# Patient Record
Sex: Female | Born: 1990 | Race: White | Hispanic: No | Marital: Married | Smoking: Never smoker
Health system: Southern US, Community
[De-identification: ages and names within clinical notes are randomized; demographics above are authoritative.]

## PROBLEM LIST (undated history)

## (undated) ENCOUNTER — Inpatient Hospital Stay (HOSPITAL_COMMUNITY): Payer: Self-pay

## (undated) DIAGNOSIS — F32A Depression, unspecified: Secondary | ICD-10-CM

## (undated) DIAGNOSIS — M1990 Unspecified osteoarthritis, unspecified site: Secondary | ICD-10-CM

## (undated) DIAGNOSIS — G43909 Migraine, unspecified, not intractable, without status migrainosus: Secondary | ICD-10-CM

## (undated) DIAGNOSIS — G40909 Epilepsy, unspecified, not intractable, without status epilepticus: Secondary | ICD-10-CM

## (undated) DIAGNOSIS — N83209 Unspecified ovarian cyst, unspecified side: Secondary | ICD-10-CM

## (undated) DIAGNOSIS — K900 Celiac disease: Secondary | ICD-10-CM

## (undated) DIAGNOSIS — R87629 Unspecified abnormal cytological findings in specimens from vagina: Secondary | ICD-10-CM

## (undated) DIAGNOSIS — G90A Postural orthostatic tachycardia syndrome (POTS): Secondary | ICD-10-CM

## (undated) DIAGNOSIS — Z87442 Personal history of urinary calculi: Secondary | ICD-10-CM

## (undated) DIAGNOSIS — Q7962 Hypermobile Ehlers-Danlos syndrome: Secondary | ICD-10-CM

## (undated) DIAGNOSIS — F429 Obsessive-compulsive disorder, unspecified: Secondary | ICD-10-CM

## (undated) DIAGNOSIS — L299 Pruritus, unspecified: Secondary | ICD-10-CM

## (undated) DIAGNOSIS — D8940 Mast cell activation, unspecified: Secondary | ICD-10-CM

## (undated) DIAGNOSIS — L509 Urticaria, unspecified: Secondary | ICD-10-CM

## (undated) DIAGNOSIS — M797 Fibromyalgia: Secondary | ICD-10-CM

## (undated) DIAGNOSIS — N289 Disorder of kidney and ureter, unspecified: Secondary | ICD-10-CM

## (undated) DIAGNOSIS — D649 Anemia, unspecified: Secondary | ICD-10-CM

## (undated) DIAGNOSIS — S42402A Unspecified fracture of lower end of left humerus, initial encounter for closed fracture: Secondary | ICD-10-CM

## (undated) DIAGNOSIS — J45909 Unspecified asthma, uncomplicated: Secondary | ICD-10-CM

## (undated) HISTORY — PX: TYMPANOSTOMY: SHX2586

## (undated) HISTORY — DX: Migraine, unspecified, not intractable, without status migrainosus: G43.909

## (undated) HISTORY — PX: ADENOIDECTOMY: SUR15

## (undated) HISTORY — DX: Urticaria, unspecified: L50.9

## (undated) HISTORY — PX: TYMPANOSTOMY TUBE PLACEMENT: SHX32

## (undated) HISTORY — DX: Unspecified asthma, uncomplicated: J45.909

## (undated) HISTORY — DX: Obsessive-compulsive disorder, unspecified: F42.9

---
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
## 2015-10-26 ENCOUNTER — Ambulatory Visit (HOSPITAL_COMMUNITY)
Admission: EM | Admit: 2015-10-26 | Discharge: 2015-10-26 | Disposition: A | Discharge status: Home / Self Care | Payer: BLUE CROSS/BLUE SHIELD | Attending: Emergency Medicine | Admitting: Emergency Medicine

## 2015-10-26 ENCOUNTER — Encounter (HOSPITAL_COMMUNITY): Payer: Self-pay

## 2015-10-26 DIAGNOSIS — R319 Hematuria, unspecified: Secondary | ICD-10-CM

## 2015-10-26 DIAGNOSIS — R110 Nausea: Secondary | ICD-10-CM

## 2015-10-26 DIAGNOSIS — J069 Acute upper respiratory infection, unspecified: Secondary | ICD-10-CM | POA: Diagnosis not present

## 2015-10-26 DIAGNOSIS — M549 Dorsalgia, unspecified: Secondary | ICD-10-CM

## 2015-10-26 HISTORY — DX: Unspecified osteoarthritis, unspecified site: M19.90

## 2015-10-26 HISTORY — DX: Disorder of kidney and ureter, unspecified: N28.9

## 2015-10-26 LAB — POCT I-STAT, CHEM 8
BUN: 3 mg/dL — ABNORMAL LOW (ref 6–20)
CHLORIDE: 103 mmol/L (ref 101–111)
CREATININE: 0.7 mg/dL (ref 0.44–1.00)
Calcium, Ion: 1.2 mmol/L (ref 1.12–1.23)
Glucose, Bld: 91 mg/dL (ref 65–99)
HEMATOCRIT: 43 % (ref 36.0–46.0)
HEMOGLOBIN: 14.6 g/dL (ref 12.0–15.0)
POTASSIUM: 3.5 mmol/L (ref 3.5–5.1)
Sodium: 140 mmol/L (ref 135–145)
TCO2: 25 mmol/L (ref 0–100)

## 2015-10-26 LAB — POCT URINALYSIS DIP (DEVICE)
BILIRUBIN URINE: NEGATIVE
Glucose, UA: NEGATIVE mg/dL
KETONES UR: NEGATIVE mg/dL
LEUKOCYTES UA: NEGATIVE
NITRITE: NEGATIVE
Protein, ur: NEGATIVE mg/dL
SPECIFIC GRAVITY, URINE: 1.01 (ref 1.005–1.030)
Urobilinogen, UA: 0.2 mg/dL (ref 0.0–1.0)
pH: 6.5 (ref 5.0–8.0)

## 2015-10-26 LAB — POCT PREGNANCY, URINE: Preg Test, Ur: NEGATIVE

## 2015-10-26 MED ORDER — TRAMADOL HCL 50 MG PO TABS: 50.0000 mg | ORAL_TABLET | Freq: Four times a day (QID) | ORAL | Status: DC | PRN

## 2015-10-26 MED ORDER — ONDANSETRON HCL 4 MG PO TABS: 4.0000 mg | ORAL_TABLET | Freq: Three times a day (TID) | ORAL | Status: DC | PRN

## 2015-10-26 MED ORDER — PREDNISONE 50 MG PO TABS: ORAL_TABLET | ORAL | Status: DC

## 2015-10-26 NOTE — ED Provider Notes (Signed)
CSN: [id number]     Arrival date & time 10/26/15  1648 History   First MD Initiated Contact with Patient 10/26/15 1715     Chief Complaint  Patient presents with  . Back Pain  . Nephropathy   (Consider location/radiation/quality/duration/timing/severity/associated sxs/prior Treatment) HPI  She is a 25 year old woman here for evaluation ofback pain. She states for the last 3 weeks she has had some discomfort in the bilateral mid back, worse on the left side. Over the last few days this has been getting worse with pain radiating into herlower abdomen and occasionally into her legs. She denies any dysuria, but states she has had some urgency and hesitancy. In the last few days she has developed nausea, this worsened today. She has not vomited. She reports a fever yesterday of 101. She also reports feeling somewhat short of breath and some nasal congestion, which she attributes to an upper respiratory infection she caught from her child.  She had trouble remembering her age this morning which prompted her to come to the urgent care.  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 She states she's not had any problems with this for the last 5 years.  Past Medical History  Diagnosis Date  . Nephropathy   . Arthritis    History reviewed. No pertinent past surgical history. No family history on file. Social History  Substance Use Topics  . Smoking status: Never Smoker   . Smokeless tobacco: Never Used  . Alcohol Use: Yes     Comment: occasional   OB History    No data available     Review of Systems As in history of present illness Allergies  Sulfa antibiotics  Home Medications   Prior to Admission medications   Medication Sig Start Date End Date Taking? Authorizing Provider  ondansetron (ZOFRAN) 4 MG tablet Take 1 tablet (4 mg total) by mouth every 8 (eight) hours as needed for nausea or vomiting. 10/26/15   Charm Rings, MD  predniSONE (DELTASONE) 50 MG tablet Take 1 pill daily for 5  days. 10/26/15   Charm Rings, MD  traMADol (ULTRAM) 50 MG tablet Take 1 tablet (50 mg total) by mouth every 6 (six) hours as needed. 10/26/15   Charm Rings, MD   Meds Ordered and Administered this Visit  Medications - No data to display  BP 126/72 mmHg  Pulse 78  Temp(Src) 99.8 F (37.7 C) (Oral)  Resp 16  SpO2 100%  LMP 10/06/2015 (Exact Date) No data found.   Physical Exam  Constitutional: She is oriented to person, place, and time. She appears well-developed and well-nourished. No distress.  Neck: Neck supple.  Cardiovascular: Normal rate, regular rhythm and normal heart sounds.   No murmur heard. Pulmonary/Chest: Effort normal and breath sounds normal. No respiratory distress. She has no wheezes. She has no rales.  Abdominal: Soft. Bowel sounds are normal. She exhibits no distension and no mass. There is tenderness (in lower quadrants and left side). There is no rebound and no guarding.  No CVA tenderness  Musculoskeletal:  Back: No erythema or edema. No vertebral tenderness or step-offs. She is tender to palpation in the left flank area. Straight leg raise negative bilaterally.  Neurological: She is alert and oriented to person, place, and time.    ED Course  Procedures (including critical care time)  Labs Review Labs Reviewed  POCT URINALYSIS DIP (DEVICE) - Abnormal; Notable for the following:    Hgb urine dipstick MODERATE (*)  All other components within normal limits  POCT I-STAT, CHEM 8 - Abnormal; Notable for the following:    BUN 3 (*)    All other components within normal limits  POCT PREGNANCY, URINE    Imaging Review No results found.    MDM   1. Mid back pain on left side   2. Hematuria   3. Nausea   4. URI (upper respiratory infection)    Urine with moderate blood, otherwise negative.  I-STAT is normal. She does have a history of kidney stones and states this feels very different I had an extensive discussion with the patient regarding her  symptoms and possible treatment options. We discussed transfer to the emergency room for additional evaluation as I do not have a good explanation for the hematuria, nausea, and pain.  We also discussed outpatient treatment for musculoskeletal pain with prednisone and tramadol and strict return precautions if symptoms worsen.  Given that she does have a sick child at home, she would like to try outpatient therapy.  I sent prescriptions for prednisone and Zofran to her pharmacy as well as a paper prescription for tramadol.  Strict return precautions reviewed.    Charm RingsErin J Honig, MD 10/26/15 [phone]

## 2015-10-26 NOTE — ED Notes (Signed)
Patient presents with lower back pain x 3 weeks, she states she has IGA Nephropathy. Patient has taken Tylenol yesterday 10/25/2015 for pain. No acute distress

## 2015-10-26 NOTE — Discharge Instructions (Signed)
I do not have a good way to put all of your symptoms together. I suspect you do have an upper respiratory infection from your son.  This is likely the cause of your fever nasal symptoms, and shortness of breath. You do have some blood in your urine, but no signs of infection and your blood work is normal. We are going to treat your pain as musculoskeletal pain. Take prednisone daily for 5 days for inflammation. Use the tramadol every 6-8 hours as needed for severe pain. Use the Zofran every 8 hours as needed for nausea. If things are not improving or getting worse, please go to the emergency room.

## 2015-12-04 ENCOUNTER — Encounter: Payer: Self-pay | Admitting: Neurology

## 2015-12-04 ENCOUNTER — Ambulatory Visit (INDEPENDENT_AMBULATORY_CARE_PROVIDER_SITE_OTHER): Payer: BLUE CROSS/BLUE SHIELD | Admitting: Neurology

## 2015-12-04 VITALS — BP 120/77 | HR 84 | Ht 64.0 in | Wt 128.0 lb

## 2015-12-04 DIAGNOSIS — G40309 Generalized idiopathic epilepsy and epileptic syndromes, not intractable, without status epilepticus: Secondary | ICD-10-CM

## 2015-12-04 DIAGNOSIS — R404 Transient alteration of awareness: Secondary | ICD-10-CM

## 2015-12-04 MED ORDER — LEVETIRACETAM 500 MG PO TABS: 500.0000 mg | ORAL_TABLET | Freq: Two times a day (BID) | ORAL | Status: DC

## 2015-12-04 NOTE — Patient Instructions (Addendum)
Remember to drink plenty of fluid, eat healthy meals and do not skip any meals. Try to eat protein with a every meal and eat a healthy snack such as fruit or nuts in between meals. Try to keep a regular sleep-wake schedule and try to exercise daily, particularly in the form of walking, 20-30 minutes a day, if you can.   As far as your medications are concerned, I would like to suggest: Keppra 500mg  twice daily  As far as diagnostic testing: EEG then 3-day EEG  Our phone number is [phone]. We also have an after hours call service for urgent matters and there is a physician on-call for urgent questions. For any emergencies you know to call 911 or go to the nearest emergency room

## 2015-12-04 NOTE — Progress Notes (Addendum)
[facility]    Provider:  Dr Lucia GaskinsAhern Referring Provider: Eber Woodard, Angela L, MD Primary Care Physician:  Boneta LucksBrown, Angela  CC:  seizures  HPI:  Angela Woodard is a 25 y.o. female here as a referral from Dr. Manson PasseyBrown for seizures. PMHx autoimmune disorder (unknown, evaluation with Rheumatology in progress), seizures, IGA nephropathy. Here with her mother who provides information and says she was a sickly child, she was on antibiotics multiple times for ear and throat infections for many years.she says she has autoimmune disease but has not been diagnosed because serum markers (ANA) have been negative, she has been participating in "natural medicine" with her husband. Seizures started at the age of 25. The seizures happened initially at night, husband saw her have a seizure first. Mother is here and provides information, she is not aware of any seizures as a child however patient says she used to have staring spells as a child. Seizure was generalized, she was told it was violent, never lost control of bladder, never biting tongue but she has woken up with a bitten cheek. She has a seizure every few months, she has Celiac disease and will have a seizure if she eats food with gluten in it, she will have a seizure if she drinks tap water, this happens every time. Mom has seen a seizure, patient spaced out and is not aware of things around her, she will fall back, eyes roll back into her head and she tremors, sometimes it is violent, she is confused afterwards and she sometimes cries and she gets mad before she has one that is the anger aura. Placing an ice cube in her hand stops the seizure. They last for 1-2 minutes, she doesn't rememeber these events, her body hurts, she is really tired and she has to go lay down. She is confused afterwards. She had a rash on Lamictal. The last seizure was 2 months ago. She doesn't drive. She has random leg spasms. Usually the right leg but sometimes both.  No FHx of seizures. No focal neurologic deficits at baseline.  Reviewed notes, labs and imaging from outside physicians, which showed:  MRI HEAD WITHOUT AND WITH CONTRAST 2012 (personally reviewed images and agree with the following)  Technique: Multiplanar, multiecho pulse sequences of the brain and surrounding structures were obtained according to standard protocol without and with intravenous contrast  Contrast: 10 ml Multihance IV  Comparison: None  Findings: Ventricle size is normal. Negative for Chiari malformation. Pituitary gland is normal in size.  Negative for acute or chronic infarction. Negative for demyelinating disease. The white matter is normal. Brainstem and cerebellum are normal. Negative for hemorrhage.  Postcontrast images the brain reveal normal enhancement. Negative for mass lesion.  Paranasal sinuses are clear.  IMPRESSION: Normal  BMP unremarkable 10/26/2015 and urine pregnancy negative.  Review of Systems: Patient complains of symptoms per HPI as well as the following symptoms: weight loss, fatigue, easy bruising, increased thirst, joint oain, aching muscles, rash, moles. Blood in urine, skin sensitivity, insomnia, sleepiness, restless legs, changein appetite. Pertinent negatives per HPI. All others negative.   Social History   Social History  . Marital Status: Married    Spouse Name: [name]  . Number of Children: 1  . Years of Education: some colle   Occupational History  . Engineer, waterTheater teacher at [employer]    Social History Main Topics  . Smoking status: Never Smoker   . Smokeless tobacco: Never Used  . Alcohol Use: 0.0 oz/week  0 Standard drinks or equivalent per week     Comment: occasional 1-2 per week  . Drug Use: No  . Sexual Activity: Yes    Birth Control/ Protection: Condom   Other Topics Concern  . Not on file   Social History Narrative   Lives with husband and son   Caffeine use: 1 serving  daily    Family History  Problem Relation Age of Onset  . Throat cancer Father   . Melanoma Father   . Hypertension Mother   . Heart disease Paternal Grandfather   . Lung cancer Paternal Grandfather   . Obesity Paternal Grandfather   . Diabetes Paternal Grandfather   . Memory loss Maternal Grandmother   . Colon cancer Maternal Grandmother   . Anxiety disorder Sister   . Seizures Neg Hx     Past Medical History  Diagnosis Date  . Nephropathy   . Arthritis   . Migraine   . OCD (obsessive compulsive disorder)     Past Surgical History  Procedure Laterality Date  . Tympanostomy      x3    Current Outpatient Prescriptions  Medication Sig Dispense Refill  . UNABLE TO FIND Take 1 Dose by mouth daily. Med Name: Ethlyn Daniels- drinks for seizures    . UNABLE TO FIND Take 2 capsules by mouth daily. Med Name: Zyflamend, 2 capsules once daily per patient    . levETIRAcetam (KEPPRA) 500 MG tablet Take 1 tablet (500 mg total) by mouth 2 (two) times daily. 60 tablet 11   No current facility-administered medications for this visit.    Allergies as of 12/04/2015 - Review Complete 12/04/2015  Allergen Reaction Noted  . Relpax [eletriptan] Anaphylaxis 12/04/2015  . Sulfa antibiotics Hives and Rash 10/26/2015  . Gluten meal  12/04/2015  . Lamotrigine Rash 12/04/2015    Vitals: BP 120/77 mmHg  Pulse 84  Ht 5\' 4"  (1.626 m)  Wt 128 lb (58.06 kg)  BMI 21.96 kg/m2 Last Weight:  Wt Readings from Last 1 Encounters:  12/04/15 128 lb (58.06 kg)   Last Height:   Ht Readings from Last 1 Encounters:  12/04/15 5\' 4"  (1.626 m)    Physical exam: Exam: Gen: NAD, conversant, well nourised, well groomed                     CV: RRR, no MRG. No Carotid Bruits. No peripheral edema, warm, nontender Eyes: Conjunctivae clear without exudates or hemorrhage  Neuro: Detailed Neurologic Exam  Speech:    Speech is normal; fluent and spontaneous with normal comprehension.  Cognition:    The  patient is oriented to person, place, and time;     recent and remote memory intact;     language fluent;     normal attention, concentration,     fund of knowledge Cranial Nerves:    The pupils are equal, round, and reactive to light. The fundi are normal and spontaneous venous pulsations are present. Visual fields are full to finger confrontation. Extraocular movements are intact. Trigeminal sensation is intact and the muscles of mastication are normal. The face is symmetric. The palate elevates in the midline. Hearing intact. Voice is normal. Shoulder shrug is normal. The tongue has normal motion without fasciculations.   Coordination:    Normal finger to nose and heel to shin. Normal rapid alternating movements.   Gait:    Heel-toe and tandem gait are normal.   Motor Observation:    No asymmetry, no atrophy, and  no involuntary movements noted. Tone:    Normal muscle tone.    Posture:    Posture is normal. normal erect    Strength:    Strength is V/V in the upper and lower limbs.      Sensation: intact to LT     Reflex Exam:  DTR's:    Deep tendon reflexes in the upper and lower extremities are normal bilaterally.   Toes:    The toes are downgoing bilaterally.   Clonus:    Clonus is absent.      Assessment/Plan:  25 year old patient with a reported history of multiple types of seizures, staring, jerks, generalized. She has been practicing "natural medicine" recently. 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 Will start Keppra at this time and continue workup with routine eeg and 3-day ambulatory eeg. MRI in 2012 was normal. She had a rash on Lamictal in the past. She was on Lamictal in the past,  need to request old records from when she was seen by neurology 5 years ago (will request). Continue Keppra  bid. Discussed side effects, teratogenicity, mood changes, and other side effects including sedation. Provided UpToDate patient information handout. If she has a repeat seizure increase to 750 then  bid, also the XR version bid often has better coverage.   Discussed Patients with epilepsy have a small risk of sudden unexpected death, a condition referred to as sudden unexpected death in epilepsy (SUDEP). SUDEP is defined specifically as the sudden, unexpected, witnessed or unwitnessed, nontraumatic and nondrowning death in patients with epilepsy with or without evidence for a seizure, and excluding documented status epilepticus, in which post mortem examination does not reveal a structural or toxicologic cause for death   Call 911 for seizure or proceed to ED  Patient is unable to drive, operate heavy machinery, perform activities at heights or participate in water activities until 6 months seizure free   CC: Dr. Mikey Bussing, MD  [facility] [address] [place], [place] [address]  Phone [phone] Fax [phone]

## 2016-01-01 ENCOUNTER — Encounter: Payer: Self-pay | Admitting: Neurology

## 2016-01-01 ENCOUNTER — Other Ambulatory Visit: Payer: BLUE CROSS/BLUE SHIELD

## 2016-04-07 ENCOUNTER — Ambulatory Visit: Payer: BLUE CROSS/BLUE SHIELD | Admitting: Neurology

## 2017-09-21 DIAGNOSIS — F32A Depression, unspecified: Secondary | ICD-10-CM | POA: Insufficient documentation

## 2018-04-26 ENCOUNTER — Ambulatory Visit: Payer: BLUE CROSS/BLUE SHIELD | Admitting: Neurology

## 2018-04-26 DIAGNOSIS — Z0289 Encounter for other administrative examinations: Secondary | ICD-10-CM

## 2018-06-02 ENCOUNTER — Telehealth: Payer: Self-pay | Admitting: *Deleted

## 2018-06-02 NOTE — Telephone Encounter (Signed)
Received a colonoscopy and upper endoscopy clearance form from [facility] Dr. Lucia GaskinsAhern aware. Patient has not been seen in over 2 years, cannot comment on clearance. Form signed & faxed back to [facility]. Received a receipt of confirmation.

## 2019-12-18 DIAGNOSIS — G40909 Epilepsy, unspecified, not intractable, without status epilepticus: Secondary | ICD-10-CM | POA: Insufficient documentation

## 2019-12-18 DIAGNOSIS — N200 Calculus of kidney: Secondary | ICD-10-CM | POA: Insufficient documentation

## 2019-12-18 DIAGNOSIS — Z87441 Personal history of nephrotic syndrome: Secondary | ICD-10-CM | POA: Insufficient documentation

## 2020-04-11 ENCOUNTER — Telehealth: Payer: Self-pay | Admitting: Neurology

## 2020-04-11 NOTE — Telephone Encounter (Signed)
Records faxed to [facility] at [phone] on 04/11/2020.

## 2020-09-03 DIAGNOSIS — G90A Postural orthostatic tachycardia syndrome (POTS): Secondary | ICD-10-CM | POA: Insufficient documentation

## 2020-09-03 DIAGNOSIS — N02B9 Other recurrent and persistent immunoglobulin A nephropathy: Secondary | ICD-10-CM | POA: Insufficient documentation

## 2021-05-01 DIAGNOSIS — O99412 Diseases of the circulatory system complicating pregnancy, second trimester: Secondary | ICD-10-CM | POA: Insufficient documentation

## 2021-05-02 DIAGNOSIS — M797 Fibromyalgia: Secondary | ICD-10-CM | POA: Insufficient documentation

## 2021-05-02 DIAGNOSIS — K900 Celiac disease: Secondary | ICD-10-CM | POA: Insufficient documentation

## 2021-06-17 DIAGNOSIS — O368130 Decreased fetal movements, third trimester, not applicable or unspecified: Secondary | ICD-10-CM | POA: Insufficient documentation

## 2021-06-17 DIAGNOSIS — Z3A30 30 weeks gestation of pregnancy: Secondary | ICD-10-CM | POA: Insufficient documentation

## 2021-06-17 NOTE — ED Provider Notes (Signed)
Emergency Medicine Provider OB Triage Evaluation Note  Angela Woodard is a 30 y.o. female, G2P1, at [redacted] weeks gestation who presents to the emergency department with complaints of decreased fetal movement. No bleeding, fluid leakage. Some abd tightness earlier resolved. Followed by [facility] for [facility] care  Review of  Systems  Positive: decreased fetal movement Negative: abd pain, bleeding, fluid leakage  Physical Exam  BP 116/72   Pulse 72   Temp 98.2 F (36.8 C)   Resp 20   SpO2 99%  General: Awake, no distress  HEENT: Atraumatic  Resp: Normal effort  Cardiac: Normal rate Abd: Nondistended, nontender  MSK: Moves all extremities without difficulty Neuro: Speech clear  Medical Decision Making  Pt evaluated for pregnancy concern and is stable for transfer to [facility]U. Pt is in agreement with plan for transfer.  12:10 AM Discussed with MAU APP who accepts patient in transfer.  FHT 140's  Clinical Impression  Decreased fetal movement  Reassuring FHT    Darby Fleeman A, PA-C 06/18/21 0010    Dione Booze, MD 06/18/21 [phone]

## 2021-06-18 ENCOUNTER — Other Ambulatory Visit: Payer: Self-pay

## 2021-06-18 ENCOUNTER — Inpatient Hospital Stay (HOSPITAL_COMMUNITY)
Admission: AD | Admit: 2021-06-18 | Discharge: 2021-06-18 | Disposition: A | Discharge status: Home / Self Care | Payer: BLUE CROSS/BLUE SHIELD | Attending: Obstetrics & Gynecology | Admitting: Obstetrics & Gynecology

## 2021-06-18 ENCOUNTER — Encounter (HOSPITAL_COMMUNITY): Payer: Self-pay

## 2021-06-18 DIAGNOSIS — O368190 Decreased fetal movements, unspecified trimester, not applicable or unspecified: Secondary | ICD-10-CM | POA: Diagnosis present

## 2021-06-18 DIAGNOSIS — Z3A30 30 weeks gestation of pregnancy: Secondary | ICD-10-CM | POA: Diagnosis not present

## 2021-06-18 DIAGNOSIS — Z3689 Encounter for other specified antenatal screening: Secondary | ICD-10-CM

## 2021-06-18 DIAGNOSIS — O368130 Decreased fetal movements, third trimester, not applicable or unspecified: Secondary | ICD-10-CM

## 2021-06-18 DIAGNOSIS — Z3493 Encounter for supervision of normal pregnancy, unspecified, third trimester: Secondary | ICD-10-CM

## 2021-06-18 HISTORY — DX: Celiac disease: K90.0

## 2021-06-18 HISTORY — DX: Postural orthostatic tachycardia syndrome (POTS): G90.A

## 2021-06-18 HISTORY — DX: Fibromyalgia: M79.7

## 2021-06-18 HISTORY — DX: Personal history of urinary calculi: Z87.442

## 2021-06-18 HISTORY — DX: Hypermobile Ehlers-Danlos syndrome: Q79.62

## 2021-06-18 HISTORY — DX: Depression, unspecified: F32.A

## 2021-06-18 HISTORY — DX: Pruritus, unspecified: L29.9

## 2021-06-18 HISTORY — DX: Mast cell activation, unspecified: D89.40

## 2021-06-18 HISTORY — DX: Epilepsy, unspecified, not intractable, without status epilepticus: G40.909

## 2021-06-18 LAB — URINALYSIS, ROUTINE W REFLEX MICROSCOPIC
Bilirubin Urine: NEGATIVE
Glucose, UA: NEGATIVE mg/dL
Ketones, ur: NEGATIVE mg/dL
Leukocytes,Ua: NEGATIVE
Nitrite: NEGATIVE
Protein, ur: NEGATIVE mg/dL
Specific Gravity, Urine: 1.015 (ref 1.005–1.030)
pH: 6.5 (ref 5.0–8.0)

## 2021-06-18 LAB — URINALYSIS, MICROSCOPIC (REFLEX): Bacteria, UA: NONE SEEN

## 2021-06-18 NOTE — Progress Notes (Signed)
Pt does report having a seizure in October. Unwitnessed. Unsure if hitting abdomen

## 2021-06-18 NOTE — MAU Provider Note (Signed)
Chief Complaint:  Decreased Fetal Movement   Event Date/Time   First Provider Initiated Contact with Patient 06/18/21 0123     HPI: Angela Woodard is a 30 y.o. G2P1001 at [redacted]w[redacted]d who presents to maternity admissions reporting decreased fetal movement all day but no contractions or vaginal bleeding. Concerned she has a UTI, was tested for it at her last doctor's appointment but got no results called to her. Having some left-sided intermittent kidney pain but no dysuria, urgency, or difficulty urinating. Denies fever, falls or other recent illness.    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 Reviewed prenatal records, last UA was clear - symptoms have not gotten worse since that test.  Past Medical History:  Diagnosis Date   Arthritis    Fibromyalgia    Migraine    Nephropathy    OCD (obsessive compulsive disorder)    POTS (postural orthostatic tachycardia syndrome)    OB History  Gravida Para Term Preterm AB Living  2 1 1     1   SAB IAB Ectopic Multiple Live Births               # Outcome Date GA Lbr Len/2nd Weight Sex Delivery Anes PTL Lv  2 Current           1 Term 07/06/20     Vag-Spont  Y    Past Surgical History:  Procedure Laterality Date   TYMPANOSTOMY     x3   Family History  Problem Relation Age of Onset   Throat cancer Father    Melanoma Father    Hypertension Mother    Heart disease Paternal Grandfather    Lung cancer Paternal Grandfather    Obesity Paternal Grandfather    Diabetes Paternal Grandfather    Memory loss Maternal Grandmother    Colon cancer Maternal Grandmother    Anxiety disorder Sister    Seizures Neg Hx    Social History   Tobacco Use   Smoking status: Never   Smokeless tobacco: Never  Vaping Use   Vaping Use: Never used  Substance Use Topics   Alcohol use: Not Currently    Comment:  occasional 1-2 per week   Drug use: No   Allergies  Allergen Reactions   Relpax [Eletriptan] Anaphylaxis    Throat swelling   Sulfa Antibiotics Hives and Rash   Gluten Meal    Latex Hives   Lamotrigine Rash   Medications Prior to Admission  Medication Sig Dispense Refill Last Dose   aspirin 81 MG chewable tablet Chew 81 mg by mouth daily.   06/17/2021 at 2200   cetirizine (ZYRTEC) 10 MG tablet Take 10 mg by mouth daily.   06/17/2021 at 2200   folic acid (FOLVITE) 1 MG tablet Take 4 mg by mouth daily.   06/17/2021 at 2200   lacosamide (VIMPAT) 200 MG TABS tablet Take 100 mg by mouth 2 (two) times daily.   06/17/2021 at 0900   Prenatal Vit-Fe Fumarate-FA (PRENATAL PO) Take 1 tablet by mouth daily.   06/17/2021 at 2200   Prenatal-FeFum-FA-DHA w/o A (PRENATAL + DHA PO) Take 1 tablet by mouth daily.   06/17/2021 at 2200   propranolol (INDERAL) 10 MG tablet Take 10 mg by mouth 3 (three) times daily.   06/17/2021 at 1600   levETIRAcetam (KEPPRA) 500 MG tablet Take 1 tablet (500 mg total) by  mouth 2 (two) times daily. (Patient not taking: Reported on 06/18/2021) 60 tablet 11 Not Taking   UNABLE TO FIND Take 1 Dose by mouth daily. Med Name: Mullein- drinks for seizures      UNABLE TO FIND Take 2 capsules by mouth daily. Med Name: Zyflamend, 2 capsules once daily per patient      I have reviewed patient's Past Medical Hx, Surgical Hx, Family Hx, Social Hx, medications and allergies.   ROS:  Pertinent items noted in HPI and remainder of comprehensive ROS otherwise negative.   Physical Exam  Patient Vitals for the past 24 hrs:  BP Temp Temp src Pulse Resp SpO2  06/18/21 0212 104/64 97.9 F (36.6 C) Oral 80 17 100 %  06/18/21 0205 -- -- -- -- -- 98 %  06/18/21 0200 -- -- -- -- -- 98 %  06/18/21 0155 -- -- -- -- -- 97 %  06/18/21 0150 -- -- -- -- -- 98 %  06/18/21 0145 -- -- -- -- -- 98 %  06/18/21 0140 -- -- -- -- -- 98 %  06/18/21 0135 -- -- -- -- -- 98 %  06/18/21 0130 -- -- -- -- --  98 %  06/18/21 0125 -- -- -- -- -- 98 %  06/18/21 0120 -- -- -- -- -- 98 %  06/18/21 0115 -- -- -- -- -- 98 %  06/18/21 0114 -- -- -- -- -- 98 %  06/18/21 0110 -- -- -- -- -- 98 %  06/18/21 0105 (!) 106/59 -- -- 78 17 97 %  06/18/21 0039 111/66 97.9 F (36.6 C) -- 80 17 98 %  06/18/21 0007 116/72 98.2 F (36.8 C) -- 72 20 99 %  06/18/21 0006 116/72 98.2 F (36.8 C) Oral 79 20 97 %   Constitutional: Well-developed, well-nourished female in no acute distress.  Cardiovascular: normal rate & rhythm Respiratory: normal effort GI: Abd soft, non-tender, gravid appropriate for gestational age.  MS: Extremities nontender, no edema, normal ROM Neurologic: Alert and oriented x 4.  GU: mild left sided CVA tenderness Pelvic: exam deferred  Fetal Tracing: reactive with improved fetal movement as soon as NST began Baseline: 135 Variability: moderate Accelerations: 15x15 Decelerations: none Toco: relaxed   Labs: Results for orders placed or performed during the hospital encounter of 06/18/21 (from the past 24 hour(s))  Urinalysis, Routine w reflex microscopic Urine, Clean Catch     Status: Abnormal   Collection Time: 06/18/21 12:45 AM  Result Value Ref Range   Color, Urine YELLOW YELLOW   APPearance CLEAR CLEAR   Specific Gravity, Urine 1.015 1.005 - 1.030   pH 6.5 5.0 - 8.0   Glucose, UA NEGATIVE NEGATIVE mg/dL   Hgb urine dipstick SMALL (A) NEGATIVE   Bilirubin Urine NEGATIVE NEGATIVE   Ketones, ur NEGATIVE NEGATIVE mg/dL   Protein, ur NEGATIVE NEGATIVE mg/dL   Nitrite NEGATIVE NEGATIVE   Leukocytes,Ua NEGATIVE NEGATIVE  Urinalysis, Microscopic (reflex)     Status: None   Collection Time: 06/18/21 12:45 AM  Result Value Ref Range   RBC / HPF 0-5 0 - 5 RBC/hpf   WBC, UA 0-5 0 - 5 WBC/hpf   Bacteria, UA NONE SEEN NONE SEEN   Squamous Epithelial / LPF 0-5 0 - 5   Imaging:  No results found.  MAU Course: Orders Placed This Encounter  Procedures   Urinalysis, Routine w reflex  microscopic Urine, Clean Catch   Urinalysis, Microscopic (reflex)   Discharge patient   No orders  of the defined types were placed in this encounter.  MDM: NST reactive with improved fetal movement. UA clear and symptoms have not progressed since last UA on 06/06/21. Pt says it is normal for her to have blood in her urine due to her IgA nephropathy. Will follow up with nephrologist regarding the CVA tenderness and has follow up at [facility] later this week.   Assessment: 1. Decreased fetal movement during pregnancy, antepartum, single or unspecified fetus   2. Fetal movement present during pregnancy in third trimester   3. [redacted] weeks gestation of pregnancy   4. NST (non-stress test) reactive    Plan: Discharge home in stable condition with return precautions.     Follow-up Information     [facility]. Go to.   Why: as scheduled for ongoing prenatal care Contact information: [address]   [place], [place] [address]   [phone] (Work)   [phone] (Fax)                Allergies as of 06/18/2021       Reactions   Relpax [eletriptan] Anaphylaxis   Throat swelling   Sulfa Antibiotics Hives, Rash   Gluten Meal    Latex Hives   Lamotrigine Rash        Medication List     TAKE these medications    aspirin 81 MG chewable tablet Chew 81 mg by mouth daily.   cetirizine 10 MG tablet Commonly known as: ZYRTEC Take 10 mg by mouth daily.   folic acid 1 MG tablet Commonly known as: FOLVITE Take 4 mg by mouth daily.   lacosamide 200 MG Tabs tablet Commonly known as: VIMPAT Take 100 mg by mouth 2 (two) times daily.   levETIRAcetam 500 MG tablet Commonly known as: Keppra Take 1 tablet (500 mg total) by mouth 2 (two) times daily.   PRENATAL + DHA PO Take 1 tablet by mouth daily.   PRENATAL PO Take 1 tablet by mouth daily.   propranolol 10 MG tablet Commonly known as: INDERAL Take 10 mg by mouth 3 (three) times daily.    UNABLE TO FIND Take 1 Dose by mouth daily. Med Name: Mullein- drinks for seizures   UNABLE TO FIND Take 2 capsules by mouth daily. Med Name: Zyflamend, 2 capsules once daily per patient       Edd Arbour, CNM, MSN, IBCLC Certified Nurse Midwife, [facility]

## 2021-06-18 NOTE — MAU Note (Signed)
Pt receives her care at [facility] due to being high risk due to maternal health issues. Baby has not moved like usual all day Monday. Pt states her ob thought she may have a uti but pt is not on any meds. Pt has hx of kidney stones and multiple health issues. Denis VB or LOF.

## 2021-06-18 NOTE — ED Notes (Signed)
MAU charge RN notified and transport coming to take to to MAU.

## 2021-07-03 ENCOUNTER — Encounter (HOSPITAL_COMMUNITY): Payer: Self-pay | Admitting: Obstetrics and Gynecology

## 2021-07-03 ENCOUNTER — Inpatient Hospital Stay (HOSPITAL_COMMUNITY): Payer: BLUE CROSS/BLUE SHIELD

## 2021-07-03 ENCOUNTER — Inpatient Hospital Stay (HOSPITAL_COMMUNITY)
Admission: AD | Admit: 2021-07-03 | Discharge: 2021-07-04 | Disposition: A | Discharge status: Home / Self Care | Payer: BLUE CROSS/BLUE SHIELD | Attending: Obstetrics and Gynecology | Admitting: Obstetrics and Gynecology

## 2021-07-03 DIAGNOSIS — R109 Unspecified abdominal pain: Secondary | ICD-10-CM | POA: Insufficient documentation

## 2021-07-03 DIAGNOSIS — O26893 Other specified pregnancy related conditions, third trimester: Secondary | ICD-10-CM | POA: Diagnosis not present

## 2021-07-03 DIAGNOSIS — O99353 Diseases of the nervous system complicating pregnancy, third trimester: Secondary | ICD-10-CM | POA: Insufficient documentation

## 2021-07-03 DIAGNOSIS — O26833 Pregnancy related renal disease, third trimester: Secondary | ICD-10-CM | POA: Insufficient documentation

## 2021-07-03 DIAGNOSIS — O99613 Diseases of the digestive system complicating pregnancy, third trimester: Secondary | ICD-10-CM | POA: Diagnosis not present

## 2021-07-03 DIAGNOSIS — O99891 Other specified diseases and conditions complicating pregnancy: Secondary | ICD-10-CM | POA: Insufficient documentation

## 2021-07-03 DIAGNOSIS — R1030 Lower abdominal pain, unspecified: Secondary | ICD-10-CM | POA: Diagnosis not present

## 2021-07-03 DIAGNOSIS — K5900 Constipation, unspecified: Secondary | ICD-10-CM | POA: Diagnosis not present

## 2021-07-03 DIAGNOSIS — G40909 Epilepsy, unspecified, not intractable, without status epilepticus: Secondary | ICD-10-CM | POA: Diagnosis not present

## 2021-07-03 DIAGNOSIS — Z87442 Personal history of urinary calculi: Secondary | ICD-10-CM | POA: Insufficient documentation

## 2021-07-03 DIAGNOSIS — N281 Cyst of kidney, acquired: Secondary | ICD-10-CM | POA: Diagnosis not present

## 2021-07-03 DIAGNOSIS — M549 Dorsalgia, unspecified: Secondary | ICD-10-CM | POA: Diagnosis not present

## 2021-07-03 DIAGNOSIS — N898 Other specified noninflammatory disorders of vagina: Secondary | ICD-10-CM | POA: Insufficient documentation

## 2021-07-03 DIAGNOSIS — Z0371 Encounter for suspected problem with amniotic cavity and membrane ruled out: Secondary | ICD-10-CM

## 2021-07-03 DIAGNOSIS — Z3A32 32 weeks gestation of pregnancy: Secondary | ICD-10-CM | POA: Diagnosis not present

## 2021-07-03 DIAGNOSIS — M7918 Myalgia, other site: Secondary | ICD-10-CM | POA: Diagnosis not present

## 2021-07-03 LAB — WET PREP, GENITAL
Sperm: NONE SEEN
Trich, Wet Prep: NONE SEEN
WBC, Wet Prep HPF POC: >=10 — AB (ref <10)
Yeast Wet Prep HPF POC: NONE SEEN

## 2021-07-03 LAB — CBC WITH DIFFERENTIAL/PLATELET
Abs Immature Granulocytes: 0.08 10*3/uL — ABNORMAL HIGH (ref 0.00–0.07)
Basophils Absolute: 0 10*3/uL (ref 0.0–0.1)
Basophils Relative: 0 %
Eosinophils Absolute: 0.1 10*3/uL (ref 0.0–0.5)
Eosinophils Relative: 1 %
HCT: 35.9 % — ABNORMAL LOW (ref 36.0–46.0)
Hemoglobin: 12.5 g/dL (ref 12.0–15.0)
Immature Granulocytes: 1 %
Lymphocytes Relative: 28 %
Lymphs Abs: 3.5 10*3/uL (ref 0.7–4.0)
MCH: 33.2 pg (ref 26.0–34.0)
MCHC: 34.8 g/dL (ref 30.0–36.0)
MCV: 95.5 fL (ref 80.0–100.0)
Monocytes Absolute: 1 10*3/uL (ref 0.1–1.0)
Monocytes Relative: 8 %
Neutro Abs: 7.9 10*3/uL — ABNORMAL HIGH (ref 1.7–7.7)
Neutrophils Relative %: 62 %
Platelets: 199 10*3/uL (ref 150–400)
RBC: 3.76 MIL/uL — ABNORMAL LOW (ref 3.87–5.11)
RDW: 12.4 % (ref 11.5–15.5)
WBC: 12.6 10*3/uL — ABNORMAL HIGH (ref 4.0–10.5)
nRBC: 0 % (ref 0.0–0.2)

## 2021-07-03 LAB — URINALYSIS, ROUTINE W REFLEX MICROSCOPIC
Bilirubin Urine: NEGATIVE
Glucose, UA: NEGATIVE mg/dL
Ketones, ur: NEGATIVE mg/dL
Leukocytes,Ua: NEGATIVE
Nitrite: NEGATIVE
Protein, ur: NEGATIVE mg/dL
Specific Gravity, Urine: 1.019 (ref 1.005–1.030)
pH: 5 (ref 5.0–8.0)

## 2021-07-03 LAB — AMNISURE RUPTURE OF MEMBRANE (ROM) NOT AT ARMC: Amnisure ROM: NEGATIVE

## 2021-07-03 NOTE — MAU Provider Note (Signed)
Chief Complaint:  Rupture of Membranes and Abdominal Pain   Event Date/Time   First Provider Initiated Contact with Patient 07/03/21 2226      HPI: Angela Woodard is a 30 y.o. G3P1011 at [redacted]w[redacted]d  who receives care at [facility] with hx significant for renal calculi and seizure disorder, who presents to maternity admissions reporting back/flank pain x 2 weeks c/w previous kidney stone pain, onset of constant abdominal pain bilaterally in mid abdomen with additional stabbing lower abdominal pain that is intermittent. She reports some bilateral pain that worsens with movement but there is some constant pain, which is not like round ligament pain she has had in the past.  She reports constipation in this pregnancy but did have a bowel movement today.  She reports good fetal movement.   Location: bilateral mid and lower abdomen and bilateral low back/flank Quality: cramping and sharp Severity: 6/10 on pain scale Duration: 1 day for abdomen, 2 weeks for back/flank Timing: constant Modifying factors: none Associated signs and symptoms: none  HPI  Past Medical History: Past Medical History:  Diagnosis Date   Arthritis    Celiac disease    Depression    Ehlers-Danlos, hypermobile type    Normal echo 2021   Fibromyalgia    History of kidney stones    Mast cell activation syndrome (HCC)    Migraine    Nephropathy    OCD (obsessive compulsive disorder)    POTS (postural orthostatic tachycardia syndrome)    Pruritus    Normal bile acids 06/06/2021   Seizure disorder (HCC)    Last reported event 05/15/2021- during pt sleep    Past obstetric history: OB History  Gravida Para Term Preterm AB Living  3 1 1   1 1   SAB IAB Ectopic Multiple Live Births  1            # Outcome Date GA Lbr Len/2nd Weight Sex Delivery Anes PTL Lv  3 Current           2 Term 07/06/20     Vag-Spont  Y   1 SAB             Past Surgical History: Past Surgical History:  Procedure Laterality Date   TYMPANOSTOMY      x3    Family History: Family History  Problem Relation Age of Onset   Throat cancer Father    Melanoma Father    Hypertension Mother    Heart disease Paternal Grandfather    Lung cancer Paternal Grandfather    Obesity Paternal Grandfather    Diabetes Paternal Grandfather    Memory loss Maternal Grandmother    Colon cancer Maternal Grandmother    Anxiety disorder Sister    Seizures Neg Hx     Social History: Social History   Tobacco Use   Smoking status: Never   Smokeless tobacco: Never  Vaping Use   Vaping Use: Former  Substance Use Topics   Alcohol use: Not Currently    Comment: occasional 1-2 per week   Drug use: No    Allergies:  Allergies  Allergen Reactions   Relpax [Eletriptan] Anaphylaxis    Throat swelling   Sulfa Antibiotics Hives and Rash   Gluten Meal    Latex Hives   Lamotrigine Rash    Meds:  Medications Prior to Admission  Medication Sig Dispense Refill Last Dose   aspirin 81 MG chewable tablet Chew 81 mg by mouth daily.   07/03/2021   cetirizine (ZYRTEC)  10 MG tablet Take 10 mg by mouth daily.   [id number]   folic acid (FOLVITE) 1 MG tablet Take 4 mg by mouth daily.   07/03/2021   lacosamide (VIMPAT) 200 MG TABS tablet Take 100 mg by mouth 2 (two) times daily.   07/03/2021   levETIRAcetam (KEPPRA) 500 MG tablet Take 1 tablet (500 mg total) by mouth 2 (two) times daily. 60 tablet 11 Past Month   Prenatal Vit-Fe Fumarate-FA (PRENATAL PO) Take 1 tablet by mouth daily.   07/03/2021   Prenatal-FeFum-FA-DHA w/o A (PRENATAL + DHA PO) Take 1 tablet by mouth daily.   07/03/2021   propranolol (INDERAL) 10 MG tablet Take 10 mg by mouth 3 (three) times daily.   07/03/2021   UNABLE TO FIND Take 1 Dose by mouth daily. Med Name: Mullein- drinks for seizures      UNABLE TO FIND Take 2 capsules by mouth daily. Med Name: Zyflamend, 2 capsules once daily per patient       ROS:  Review of Systems  Constitutional:  Negative for chills, fatigue and fever.   Eyes:  Negative for visual disturbance.  Respiratory:  Negative for shortness of breath.   Cardiovascular:  Negative for chest pain.  Gastrointestinal:  Positive for abdominal pain. Negative for nausea and vomiting.  Genitourinary:  Positive for flank pain and vaginal discharge. Negative for difficulty urinating, dysuria, pelvic pain, vaginal bleeding and vaginal pain.  Musculoskeletal:  Positive for back pain.  Neurological:  Negative for dizziness and headaches.  Psychiatric/Behavioral: Negative.      I have reviewed patient's Past Medical Hx, Surgical Hx, Family Hx, Social Hx, medications and allergies.   Physical Exam  Patient Vitals for the past 24 hrs:  BP Temp Pulse Resp SpO2  07/04/21 0113 (!) 95/55 -- 70 16 --  07/03/21 2224 111/61 -- 75 -- --  07/03/21 2147 (!) 105/58 -- -- -- --  07/03/21 2146 -- 98.1 F (36.7 C) 83 17 99 %   Constitutional: Well-developed, well-nourished female in no acute distress.  Cardiovascular: normal rate Respiratory: normal effort GI: Abd soft, non-tender, gravid appropriate for gestational age.  MS: Extremities nontender, no edema, normal ROM Neurologic: Alert and oriented x 4.  GU: Neg CVAT.  PELVIC EXAM: Cervix pink, visually closed, without lesion, scant white creamy discharge, negative pooling of fluid with Valsalva, vaginal walls and external genitalia normal   Dilation: Fingertip Effacement (%): Thick Cervical Position: Posterior Exam by:: Fatima Blank CNM  FHT:  Baseline 135 , moderate variability, accelerations present, no decelerations Contractions: occasional irritability   Labs: Results for orders placed or performed during the hospital encounter of 07/03/21 (from the past 24 hour(s))  Urinalysis, Routine w reflex microscopic Urine, Clean Catch     Status: Abnormal   Collection Time: 07/03/21  9:55 PM  Result Value Ref Range   Color, Urine YELLOW YELLOW   APPearance HAZY (A) CLEAR   Specific Gravity, Urine 1.019  1.005 - 1.030   pH 5.0 5.0 - 8.0   Glucose, UA NEGATIVE NEGATIVE mg/dL   Hgb urine dipstick MODERATE (A) NEGATIVE   Bilirubin Urine NEGATIVE NEGATIVE   Ketones, ur NEGATIVE NEGATIVE mg/dL   Protein, ur NEGATIVE NEGATIVE mg/dL   Nitrite NEGATIVE NEGATIVE   Leukocytes,Ua NEGATIVE NEGATIVE   RBC / HPF 0-5 0 - 5 RBC/hpf   WBC, UA 6-10 0 - 5 WBC/hpf   Bacteria, UA RARE (A) NONE SEEN   Squamous Epithelial / LPF 6-10 0 - 5   Mucus  PRESENT    Hyaline Casts, UA PRESENT   Am[facility]     Status: None   Collection Time: 07/03/21 10:44 PM  Result Value Ref Range   Amnisure ROM NEGATIVE   Wet prep, genital     Status: Abnormal   Collection Time: 07/03/21 10:45 PM  Result Value Ref Range   Yeast Wet Prep HPF POC NONE SEEN NONE SEEN   Trich, Wet Prep NONE SEEN NONE SEEN   Clue Cells Wet Prep HPF POC PRESENT (A) NONE SEEN   WBC, Wet Prep HPF POC >=10 (A) <10   Sperm NONE SEEN   CBC with Differential/Platelet     Status: Abnormal   Collection Time: 07/03/21 11:25 PM  Result Value Ref Range   WBC 12.6 (H) 4.0 - 10.5 K/uL   RBC 3.76 (L) 3.87 - 5.11 MIL/uL   Hemoglobin 12.5 12.0 - 15.0 g/dL   HCT 16.135.9 (L) 09.636.0 - 04.546.0 %   MCV 95.5 80.0 - 100.0 fL   MCH 33.2 26.0 - 34.0 pg   MCHC 34.8 30.0 - 36.0 g/dL   RDW 40.912.4 81.111.5 - 91.415.5 %   Platelets 199 150 - 400 K/uL   nRBC 0.0 0.0 - 0.2 %   Neutrophils Relative % 62 %   Neutro Abs 7.9 (H) 1.7 - 7.7 K/uL   Lymphocytes Relative 28 %   Lymphs Abs 3.5 0.7 - 4.0 K/uL   Monocytes Relative 8 %   Monocytes Absolute 1.0 0.1 - 1.0 K/uL   Eosinophils Relative 1 %   Eosinophils Absolute 0.1 0.0 - 0.5 K/uL   Basophils Relative 0 %   Basophils Absolute 0.0 0.0 - 0.1 K/uL   Immature Granulocytes 1 %   Abs Immature Granulocytes 0.08 (H) 0.00 - 0.07 K/uL  Comprehensive metabolic panel     Status: Abnormal   Collection Time: 07/03/21 11:38 PM  Result Value Ref Range   Sodium 133 (L) 135 - 145 mmol/L   Potassium 3.5 3.5 - 5.1  mmol/L   Chloride 105 98 - 111 mmol/L   CO2 21 (L) 22 - 32 mmol/L   Glucose, Bld 105 (H) 70 - 99 mg/dL   BUN <5 (L) 6 - 20 mg/dL   Creatinine, Ser 7.820.54 0.44 - 1.00 mg/dL   Calcium 8.8 (L) 8.9 - 10.3 mg/dL   Total Protein 5.9 (L) 6.5 - 8.1 g/dL   Albumin 2.8 (L) 3.5 - 5.0 g/dL   AST 15 15 - 41 U/L   ALT 11 0 - 44 U/L   Alkaline Phosphatase 78 38 - 126 U/L   Total Bilirubin 0.2 (L) 0.3 - 1.2 mg/dL   GFR, Estimated >95>60 >62>60 mL/min   Anion gap 7 5 - 15      Imaging:  US Renal  Result Date: 07/04/2021 CLINICAL DATA:  Back pain in pregnancy EXAM: RENAL / URINARY TRACT ULTRASOUND COMPLETE COMPARISON:  None. FINDINGS: Right Kidney: Renal measurements: 11.8 x 4.8 x 4.3 cm = volume: 126.1 mL. Echogenicity within normal limits. No mass or hydronephrosis visualized. Left Kidney: Renal measurements: 10.8 x 6.0 x 5.0 cm = volume: 169.8 mL. Left upper pole renal cyst measures 1.9 x 0.8 cm. No hydronephrosis solid renal mass. Bladder: Appears normal for degree of bladder distention. Other: None. IMPRESSION: No hydronephrosis or other acute renal abnormality. Electronically Signed   By: Deatra RobinsonKevin  Herman M.D.   On: 07/04/2021 00:30    MAU Course/MDM: Orders Placed This Encounter  Procedures  Wet prep, genital   Culture, OB Urine   US Renal   Urinalysis, Routine w reflex microscopic Urine, Clean Catch   Amnisure rupture of membrane (rom)not at [facility]   CBC with Differential/Platelet   Comprehensive metabolic panel   Discharge patient    No orders of the defined types were placed in this encounter.    NST reviewed and reactive No evidence of ROM with negative pooling, ferning, and amnisure No evidence of preterm labor with cervix FT/long x 2 + hours in MAU No acute abdomen on exam, WBCs wnl Urine wnl except with moderate Hgb.  Pt with hx kidney stones and 2 week hx worsening flank/back pain so renal US performed which was wnl Likely MSK pain, discussed with pt Rest/ice/heat/warm bath/increase PO  fluids/Tylenol/pregnancy support belt FU with prenatal visits as scheduled, return to MAU as needed for emergencies    Assessment: 1. Abdominal pain during pregnancy in third trimester   2. Musculoskeletal pain   3. Back pain complicating pregnancy, third trimester   4. Bilateral flank pain   5. Encounter for suspected premature rupture of amniotic membranes, with rupture of membranes not found   6. [redacted] weeks gestation of pregnancy     Plan: Discharge home Labor precautions and fetal kick counts  Follow-up Information     Your prenatal provider at [facility] Follow up.   Why: As scheduled        [facility] Follow up.   Specialty: Obstetrics and Gynecology Why: As needed for emergencies or signs of labor Contact information: [address] [id number] [place] [phone]               Allergies as of 07/04/2021       Reactions   Relpax [eletriptan] Anaphylaxis   Throat swelling   Sulfa Antibiotics Hives, Rash   Gluten Meal    Latex Hives   Lamotrigine Rash        Medication List     TAKE these medications    aspirin 81 MG chewable tablet Chew 81 mg by mouth daily.   cetirizine 10 MG tablet Commonly known as: ZYRTEC Take 10 mg by mouth daily.   folic acid 1 MG tablet Commonly known as: FOLVITE Take 4 mg by mouth daily.   lacosamide 200 MG Tabs tablet Commonly known as: VIMPAT Take 100 mg by mouth 2 (two) times daily.   levETIRAcetam 500 MG tablet Commonly known as: Keppra Take 1 tablet (500 mg total) by mouth 2 (two) times daily.   PRENATAL + DHA PO Take 1 tablet by mouth daily.   PRENATAL PO Take 1 tablet by mouth daily.   propranolol 10 MG tablet Commonly known as: INDERAL Take 10 mg by mouth 3 (three) times daily.   UNABLE TO FIND Take 1 Dose by mouth daily. Med Name: Mullein- drinks for seizures   UNABLE TO FIND Take 2 capsules by mouth daily. Med Name: Zyflamend, 2 capsules  once daily per patient        Fatima Blank Certified Nurse-Midwife 07/04/2021 1:23 AM

## 2021-07-03 NOTE — MAU Note (Signed)
Having constant abd pain since yesterday. Abdomen gets tight and sometimes feels like hot knife behind belly button. For couple days at times my underwear is completely wet with clear fld. Was seen at [facility] this am but did not mention leaking fld. I did not feel they were listening to me and was just in a "different head space" so I did not mention leaking fld. Nauseated all day and have been in bed all day. Pain is alittle better but pain in abd still present. Headache all day. Had nose bleed last night for an hour

## 2021-07-04 DIAGNOSIS — M549 Dorsalgia, unspecified: Secondary | ICD-10-CM

## 2021-07-04 DIAGNOSIS — R109 Unspecified abdominal pain: Secondary | ICD-10-CM | POA: Diagnosis not present

## 2021-07-04 DIAGNOSIS — Z0371 Encounter for suspected problem with amniotic cavity and membrane ruled out: Secondary | ICD-10-CM

## 2021-07-04 DIAGNOSIS — O99891 Other specified diseases and conditions complicating pregnancy: Secondary | ICD-10-CM

## 2021-07-04 DIAGNOSIS — Z3A32 32 weeks gestation of pregnancy: Secondary | ICD-10-CM

## 2021-07-04 DIAGNOSIS — O26893 Other specified pregnancy related conditions, third trimester: Secondary | ICD-10-CM

## 2021-07-04 LAB — GC/CHLAMYDIA PROBE AMP (~~LOC~~) NOT AT ARMC
Chlamydia: NEGATIVE
Comment: NEGATIVE
Comment: NORMAL
Neisseria Gonorrhea: NEGATIVE

## 2021-07-04 LAB — COMPREHENSIVE METABOLIC PANEL
ALT: 11 U/L (ref 0–44)
AST: 15 U/L (ref 15–41)
Albumin: 2.8 g/dL — ABNORMAL LOW (ref 3.5–5.0)
Alkaline Phosphatase: 78 U/L (ref 38–126)
Anion gap: 7 (ref 5–15)
BUN: <5 mg/dL — ABNORMAL LOW (ref 6–20)
CO2: 21 mmol/L — ABNORMAL LOW (ref 22–32)
Calcium: 8.8 mg/dL — ABNORMAL LOW (ref 8.9–10.3)
Chloride: 105 mmol/L (ref 98–111)
Creatinine, Ser: 0.54 mg/dL (ref 0.44–1.00)
GFR, Estimated: >60 mL/min (ref >60)
Glucose, Bld: 105 mg/dL — ABNORMAL HIGH (ref 70–99)
Potassium: 3.5 mmol/L (ref 3.5–5.1)
Sodium: 133 mmol/L — ABNORMAL LOW (ref 135–145)
Total Bilirubin: 0.2 mg/dL — ABNORMAL LOW (ref 0.3–1.2)
Total Protein: 5.9 g/dL — ABNORMAL LOW (ref 6.5–8.1)

## 2021-07-04 LAB — CULTURE, OB URINE: Culture: NO GROWTH

## 2021-07-04 NOTE — Discharge Instructions (Signed)
Reasons to return to MAU at [facility]:  Since you are preterm, return to MAU if:  1.  Contractions are 10 minutes apart or less and they becoming more uncomfortable or painful over time 2.  You have a large gush of fluid, or a trickle of fluid that will not stop and you have to wear a pad 3.  You have bleeding that is bright red, heavier than spotting--like menstrual bleeding (spotting can be normal in early labor or after a check of your cervix) 4.  You do not feel the baby moving like he/she normally does  

## 2021-07-04 NOTE — MAU Note (Addendum)
Confirmed with CNM that pt does not need to be put back on FHR monitor after pt returned from ultrasound.

## 2021-08-09 ENCOUNTER — Ambulatory Visit (HOSPITAL_BASED_OUTPATIENT_CLINIC_OR_DEPARTMENT_OTHER): Payer: Medicaid Other | Attending: Obstetrics and Gynecology | Admitting: Physical Therapy

## 2021-08-09 ENCOUNTER — Encounter (HOSPITAL_BASED_OUTPATIENT_CLINIC_OR_DEPARTMENT_OTHER): Payer: Self-pay | Admitting: Physical Therapy

## 2021-08-09 ENCOUNTER — Other Ambulatory Visit: Payer: Self-pay

## 2021-08-09 DIAGNOSIS — M797 Fibromyalgia: Secondary | ICD-10-CM | POA: Insufficient documentation

## 2021-08-09 DIAGNOSIS — M545 Low back pain, unspecified: Secondary | ICD-10-CM

## 2021-08-09 DIAGNOSIS — G8929 Other chronic pain: Secondary | ICD-10-CM

## 2021-08-09 DIAGNOSIS — R2689 Other abnormalities of gait and mobility: Secondary | ICD-10-CM

## 2021-08-09 NOTE — Therapy (Addendum)
OUTPATIENT PHYSICAL THERAPY THORACOLUMBAR EVALUATION PHYSICAL THERAPY DISCHARGE SUMMARY  Visits from Start of Care: 1  Current functional level related to goals / functional outcomes: unchanged   Remaining deficits: unknown   Education / Equipment: Discussed eval findings, rehab rationale, and progression/POC. Patient is in agreement    Patient agrees to discharge. Patient goals were not met. Patient is being discharged due to not returning since the last visit.  Addend Corrie Dandy [facility]) Ziemba MPT 12/13/22 1153a   Patient Name: Angela Woodard MRN: [id number] DOB:[birth year], 31 y.o., female Today's Date: 08/09/2021   PT End of Session - 08/09/21 1242     Visit Number 1    Number of Visits 3    Date for PT Re-Evaluation 08/16/21    Authorization Type [employer]    PT Start Time 1151    PT Stop Time 1230    PT Time Calculation (min) 39 min    Activity Tolerance Patient tolerated treatment well    Behavior During Therapy [facility] for tasks assessed/performed             Past Medical History:  Diagnosis Date   Arthritis    Celiac disease    Depression    Ehlers-Danlos, hypermobile type    Normal echo 2021   Fibromyalgia    History of kidney stones    Mast cell activation syndrome (HCC)    Migraine    Nephropathy    OCD (obsessive compulsive disorder)    POTS (postural orthostatic tachycardia syndrome)    Pruritus    Normal bile acids 06/06/2021   Seizure disorder (HCC)    Last reported event 05/15/2021- during pt sleep   Past Surgical History:  Procedure Laterality Date   TYMPANOSTOMY     x3   Patient Active Problem List   Diagnosis Date Noted   Alteration consciousness 12/04/2015    PCP: Pcp, No  REFERRING PROVIDER: Yancey Flemings, MD  REFERRING DIAG: back pain in pregnancy, Ehlers-danlos  THERAPY DIAG:  Other abnormalities of gait and mobility  Chronic midline low back pain without sciatica  ONSET DATE: chronic hypermobility with worsening  while pregnant  SUBJECTIVE:                                                                                                                                                                                           SUBJECTIVE STATEMENT: Horrible spine pain following epidural Dec 2021. Main dislocations at hips and shoulders. I tend to be really sway back and was diagnosed with scoliosis. Being induced on the 10th. I started gabapentin about 2 weeks ago- I think my energy is better.  PERTINENT HISTORY:  Ehlers-Danlos, fibromyalgia, seizure disorder,celiac disease  PAIN:  Are you having pain? Yes NPRS scale: 8/10 Pain location: spine and bilateral shoulders Pain description: constant  Aggravating factors: constant pain Relieving factors: aquatic exercise  PRECAUTIONS: active pregnancy  WEIGHT BEARING RESTRICTIONS No  FALLS:  Has patient fallen in last 6 months? No, Number of falls: 0  LIVING ENVIRONMENT: Lives with: lives with their family 4 kids, husband  PLOF: Independent  PATIENT GOALS decrease pain prior to delivery   OBJECTIVE:    SCREENING FOR RED FLAGS: Bowel or bladder incontinence: No Spinal tumors: No Cauda equina syndrome: Yes: N/T in pelvic region Compression fracture: No Abdominal aneurysm: No  COGNITION:  Overall cognitive status: Within functional limits for tasks assessed     SENSATION:  Tingly through pelvis  POSTURE:  Tends to stand with bil LE turnout, incr lumbar lordosis consistent with pregnancy, bil shoulder elevation  PALPATION: Tightness in upper traps  LUMBARAROM/PROM:  Grossly hypermobile  A/PROM A/PROM  08/09/2021  Flexion   Extension   Right lateral flexion   Left lateral flexion   Right rotation   Left rotation    (Blank rows = not tested)  LE AROM/PROM:  A/PROM Right 08/09/2021 Left 08/09/2021  Hip flexion    Hip extension    Hip abduction    Hip adduction    Hip internal rotation    Hip external rotation    Knee  flexion    Knee extension    Ankle dorsiflexion    Ankle plantarflexion    Ankle inversion    Ankle eversion     (Blank rows = not tested)  LE MMT:  MMT Right 08/09/2021 Left 08/09/2021  Hip flexion    Hip extension    Hip abduction    Hip adduction    Hip internal rotation    Hip external rotation    Knee flexion    Knee extension    Ankle dorsiflexion    Ankle plantarflexion    Ankle inversion    Ankle eversion     (Blank rows = not tested)   GAIT: Distance walked: walked to clinic from parking lot (approx 350 ft)  Assistive device utilized: none today  Level of assistance: Complete Independence Comments: reports using a cane at home    TODAY'S TREATMENT  EVAL: Standing lat press down on walker Standing pelvic tilts Mini squats- promoting glut sets Seated iso abd & add with UE resistance   PATIENT EDUCATION:  Education details: Anatomy of condition, POC, HEP, exercise form/rationale, aquatics & pelvic floor rehab  Person educated: Patient and Mom Education method: Explanation, Demonstration, Tactile cues, Verbal cues, and Handouts Education comprehension: verbalized understanding, returned demonstration, verbal cues required, tactile cues required, and needs further education   HOME EXERCISE PROGRAM: [id number]  ASSESSMENT:  CLINICAL IMPRESSION: Patient is a 31 y.o. F who was seen today for physical therapy evaluation and treatment for back pain in pregnancy with Lorinda Creed. Pt reports long history of bil hip and shoulder dislocations and did aquatic therapy when she lived in [place]. She is being induced on the 10th and would like to do a couple of appts now for pain control and return to aquatic & pelvic floor therapy following.  Objective impairments include decreased activity tolerance, difficulty walking, increased muscle spasms, improper body mechanics, postural dysfunction, and hypermobility . These impairments are limiting patient from cleaning,  community activity, meal prep, laundry, and shopping. Personal factors including 1-2 comorbidities: current pregnancy, Ehlers-Danlos with  multiple dislocations  are also affecting patient's functional outcome. Patient will benefit from skilled PT to address above impairments and improve overall function.  REHAB POTENTIAL: Fair short POC  CLINICAL DECISION MAKING: Unstable/unpredictable  EVALUATION COMPLEXITY: High   GOALS: Goals reviewed with patient? Yes  LONG TERM GOALS:   LTG Name Target Date Goal status  1 Pt will demo good form in standing posture and use of pelvic tilt Baseline: began educating at eval 08/16/21 INITIAL  2 Pt will complete aquatic exercises without increased pain Baseline: 2 appts next week 08/16/21 INITIAL  3 Pt will verbalize knowledge for return to PT after birth Baseline:will cont to educate as appropriate 08/16/21 INITIAL                       PLAN: PT FREQUENCY: 2x/week  PT DURATION: 1 week  PLANNED INTERVENTIONS: Therapeutic exercises, Therapeutic activity, Neuro Muscular re-education, Balance training, Gait training, Patient/Family education, Joint mobilization, Aquatic Therapy, Cryotherapy, Moist heat, and Manual therapy  PLAN FOR NEXT SESSION: aquatic with frankie  Check all possible CPT codes: 16109- Therapeutic Exercise, [phone]- Neuro Re-education, [phone] - Gait Training, [phone] - Manual Therapy, [id number] - Therapeutic Activities, [phone] - Self Care, and [phone] - Aquatic therapy         Sho Salguero C. Shabreka Coulon PT, DPT 08/09/21 12:56 PM

## 2021-08-12 ENCOUNTER — Ambulatory Visit (HOSPITAL_BASED_OUTPATIENT_CLINIC_OR_DEPARTMENT_OTHER): Payer: Self-pay | Admitting: Physical Therapy

## 2021-08-14 ENCOUNTER — Ambulatory Visit (HOSPITAL_BASED_OUTPATIENT_CLINIC_OR_DEPARTMENT_OTHER): Payer: Self-pay | Admitting: Physical Therapy

## 2021-08-27 NOTE — Addendum Note (Signed)
Addended by: Fredrich Romans on: 08/27/2021 10:37 AM   Modules accepted: Orders

## 2021-12-06 NOTE — Progress Notes (Unsigned)
[facility] [address] [place] [address] Phone: [phone] Subjective:   [name], am serving as a scribe for Dr. Antoine Primas.  I'm seeing this patient by the request  of:  Pcp, No  CC: hypermobile   [id number]  Angela Woodard is a 31 y.o. female with hx of seizure d/ocoming in with complaint of EDS. Hx of POTS, normal ECHO in 2021. Recommendation EDS support group. Pain everywhere.   5 day EEG showed no seizure activity.  MRI brain 2022- normal  MRI spine, normal ECHO in 2021- normal     Past Medical History:  Diagnosis Date   Arthritis    Celiac disease    Depression    Ehlers-Danlos, hypermobile type    Normal echo 2021   Fibromyalgia    History of kidney stones    Mast cell activation syndrome (HCC)    Migraine    Nephropathy    OCD (obsessive compulsive disorder)    POTS (postural orthostatic tachycardia syndrome)    Pruritus    Normal bile acids 06/06/2021   Seizure disorder (HCC)    Last reported event 05/15/2021- during pt sleep   Past Surgical History:  Procedure Laterality Date   TYMPANOSTOMY     x3   Social History   Socioeconomic History   Marital status: Married    Spouse name: Psychologist, clinical   Number of children: 1   Years of education: some colle   Highest education level: Not on file  Occupational History   Occupation: Engineer, water at [employer]  Tobacco Use   Smoking status: Never   Smokeless tobacco: Never  Vaping Use   Vaping Use: Former  Substance and Sexual Activity   Alcohol use: Not Currently    Comment: occasional 1-2 per week   Drug use: No   Sexual activity: Yes    Birth control/protection: Condom  Other Topics Concern   Not on file  Social History Narrative   Lives with husband and 2 adopted special needs children and 31 month old   Caffeine use: 1 serving daily   Social Determinants of Corporate investment banker Strain: Not on file  Food  Insecurity: Not on file  Transportation Needs: Not on file  Physical Activity: Not on file  Stress: Not on file  Social Connections: Not on file   Allergies  Allergen Reactions   Relpax [Eletriptan] Anaphylaxis    Throat swelling   Sulfa Antibiotics Hives and Rash   Gluten Meal    Latex Hives   Lamotrigine Rash   Family History  Problem Relation Age of Onset   Throat cancer Father    Melanoma Father    Hypertension Mother    Heart disease Paternal Grandfather    Lung cancer Paternal Grandfather    Obesity Paternal Grandfather    Diabetes Paternal Grandfather    Memory loss Maternal Grandmother    Colon cancer Maternal Grandmother    Anxiety disorder Sister    Seizures Neg Hx      Current Outpatient Medications (Cardiovascular):    propranolol (INDERAL) 10 MG tablet, Take 10 mg by mouth 3 (three) times daily.  Current Outpatient Medications (Respiratory):    cetirizine (ZYRTEC) 10 MG tablet, Take 10 mg by mouth daily.  Current Outpatient Medications (Analgesics):    aspirin 81 MG chewable tablet, Chew 81 mg by mouth daily.  Current Outpatient Medications (Hematological):    folic acid (FOLVITE) 1 MG tablet, Take  4 mg by mouth daily.  Current Outpatient Medications (Other):    lacosamide (VIMPAT) 200 MG TABS tablet, Take 100 mg by mouth 2 (two) times daily.   levETIRAcetam (KEPPRA) 500 MG tablet, Take 1 tablet (500 mg total) by mouth 2 (two) times daily.   Prenatal Vit-Fe Fumarate-FA (PRENATAL PO), Take 1 tablet by mouth daily.   Prenatal-FeFum-FA-DHA w/o A (PRENATAL + DHA PO), Take 1 tablet by mouth daily.   UNABLE TO FIND, Take 1 Dose by mouth daily. Med Name: Mullein- drinks for seizures   UNABLE TO FIND, Take 2 capsules by mouth daily. Med Name: Zyflamend, 2 capsules once daily per patient   Reviewed prior external information including notes and imaging from  primary care provider As well as notes that were available from care everywhere and other healthcare  systems.  Past medical history, social, surgical and family history all reviewed in electronic medical record.  No pertanent information unless stated regarding to the chief complaint.   Review of Systems:  No headache, visual changes, nausea, vomiting, diarrhea, constipation,  skin rash, fevers, chills, night sweats, weight loss, swollen lymph nodes, , joint swelling, chest pain, shortness of breath, mood changes. POSITIVE muscle aches, body aches, abdominal pain, intermittent dizziness  Objective  Blood pressure 112/80, pulse 76, height 5\' 4"  (1.626 m), weight 130 lb (59 kg), SpO2 91 %, unknown if currently breastfeeding.   General: No apparent distress alert and oriented x3 mood and affect normal, dressed appropriately.  HEENT: Pupils equal, extraocular movements intact  Respiratory: Patient's speak in full sentences and does not appear short of breath  Cardiovascular: No lower extremity edema, non tender, no erythema  Gait normal with good balance and coordination.  MSK: Significant hypermobility noted.  Beighton score of 9 noted.  More hypermobility bilaterally but does seem to be worse on the upper extremity.  No significant laxity in the neck.  Eyes are unremarkable.   [id number]; 15 additional minutes spent for Therapeutic exercises as stated in above notes.  This included exercises focusing on stretching, strengthening, with significant focus on eccentric aspects.   Long term goals include an improvement in range of motion, strength, endurance as well as avoiding reinjury. Patient's frequency would include in 1-2 times a day, 3-5 times a week for a duration of 6-12 weeks. Low back exercises that included:  Pelvic tilt/bracing instruction to focus on control of the pelvic girdle and lower abdominal muscles  Glute strengthening exercises, focusing on proper firing of the glutes without engaging the low back muscles Proper stretching techniques for maximum relief for the hamstrings, hip  flexors, low back and some rotation where tolerated  Proper technique shown and discussed handout in great detail with ATC.  All questions were discussed and answered.     Impression and Recommendations:    The above documentation has been reviewed and is accurate and complete , DO

## 2021-12-09 ENCOUNTER — Ambulatory Visit (INDEPENDENT_AMBULATORY_CARE_PROVIDER_SITE_OTHER): Payer: Medicaid Other | Admitting: Family Medicine

## 2021-12-09 DIAGNOSIS — Q796 Ehlers-Danlos syndrome, unspecified: Secondary | ICD-10-CM

## 2021-12-09 NOTE — Assessment & Plan Note (Addendum)
Patient does have some difficulty having noted with hypermobility.  Patient has been diagnosed with just the hypermobility previously.  Beighton score of 9 noted today.  Patient has a lot of of underlying depression anxiety and a lot of stress in her life that could also be contributing to amplifying some of the pain.  We discussed at this point patient has had an MRI of the spine that is completely unremarkable.  Patient wants to avoid significant number of medications and we discussed some over-the-counter natural supplements that could be helpful.  Work with Web designer and home exercises in greater detail and more stability.  Follow-up again in 4 to 8 weeks otherwise.  Encourage patient to have an ophthalmology exam and has done echocardiogram recently that was normal. Total time reviewing outside records, outside imaging was discussed in the notes as well as discussing with patient and significant other 58 minutes

## 2021-12-09 NOTE — Patient Instructions (Signed)
1000mg  of Vit C 2000iu Vit D 200mg  CoQ10 [place] recovery sandals See you again in 6 weeks

## 2021-12-31 ENCOUNTER — Ambulatory Visit (HOSPITAL_BASED_OUTPATIENT_CLINIC_OR_DEPARTMENT_OTHER): Payer: Medicaid Other | Attending: Obstetrics and Gynecology | Admitting: Physical Therapy

## 2022-01-08 ENCOUNTER — Ambulatory Visit (HOSPITAL_BASED_OUTPATIENT_CLINIC_OR_DEPARTMENT_OTHER): Payer: Managed Care, Other (non HMO) | Admitting: Physical Therapy

## 2022-01-09 NOTE — Progress Notes (Deleted)
[facility] [address] [place] [address] Phone: [phone] Subjective:    I'm seeing this patient by the request  of:  Pcp, No  CC:   [id number]  12/09/2021 Patient does have some difficulty having noted with hypermobility.  Patient has been diagnosed with just the hypermobility previously.  Beighton score of 9 noted today.  Patient has a lot of of underlying depression anxiety and a lot of stress in her life that could also be contributing to amplifying some of the pain.  We discussed at this point patient has had an MRI of the spine that is completely unremarkable.  Patient wants to avoid significant number of medications and we discussed some over-the-counter natural supplements that could be helpful.  Work with Web designer and home exercises in greater detail and more stability.  Follow-up again in 4 to 8 weeks otherwise.  Encourage patient to have an ophthalmology exam and has done echocardiogram recently that was normal. Total time reviewing outside records, outside imaging was discussed in the notes as well as discussing with patient and significant other 58 minutes  Updated 01/15/2022 Angela Woodard is a 31 y.o. female coming in with complaint of EDS       Past Medical History:  Diagnosis Date   Arthritis    Celiac disease    Depression    Ehlers-Danlos, hypermobile type    Normal echo 2021   Fibromyalgia    History of kidney stones    Mast cell activation syndrome (HCC)    Migraine    Nephropathy    OCD (obsessive compulsive disorder)    POTS (postural orthostatic tachycardia syndrome)    Pruritus    Normal bile acids 06/06/2021   Seizure disorder (HCC)    Last reported event 05/15/2021- during pt sleep   Past Surgical History:  Procedure Laterality Date   TYMPANOSTOMY     x3   Social History   Socioeconomic History   Marital status: Married    Spouse name: Psychologist, clinical   Number of children: 1   Years of  education: some colle   Highest education level: Not on file  Occupational History   Occupation: Engineer, water at [employer]  Tobacco Use   Smoking status: Never   Smokeless tobacco: Never  Vaping Use   Vaping Use: Former  Substance and Sexual Activity   Alcohol use: Not Currently    Comment: occasional 1-2 per week   Drug use: No   Sexual activity: Yes    Birth control/protection: Condom  Other Topics Concern   Not on file  Social History Narrative   Lives with husband and 2 adopted special needs children and 52 month old   Caffeine use: 1 serving daily   Social Determinants of Corporate investment banker Strain: Not on file  Food Insecurity: Not on file  Transportation Needs: Not on file  Physical Activity: Not on file  Stress: Not on file  Social Connections: Not on file   Allergies  Allergen Reactions   Relpax [Eletriptan] Anaphylaxis    Throat swelling   Sulfa Antibiotics Hives and Rash   Gluten Meal    Latex Hives   Lamotrigine Rash   Family History  Problem Relation Age of Onset   Throat cancer Father    Melanoma Father    Hypertension Mother    Heart disease Paternal Grandfather    Lung cancer Paternal Grandfather    Obesity Paternal Actor  Diabetes Paternal Grandfather    Memory loss Maternal Grandmother    Colon cancer Maternal Grandmother    Anxiety disorder Sister    Seizures Neg Hx      Current Outpatient Medications (Cardiovascular):    propranolol (INDERAL) 10 MG tablet, Take 10 mg by mouth 3 (three) times daily.  Current Outpatient Medications (Respiratory):    cetirizine (ZYRTEC) 10 MG tablet, Take 10 mg by mouth daily.  Current Outpatient Medications (Analgesics):    aspirin 81 MG chewable tablet, Chew 81 mg by mouth daily.  Current Outpatient Medications (Hematological):    folic acid (FOLVITE) 1 MG tablet, Take 4 mg by mouth daily.  Current Outpatient Medications (Other):    lacosamide (VIMPAT) 200 MG  TABS tablet, Take 100 mg by mouth 2 (two) times daily.   levETIRAcetam (KEPPRA) 500 MG tablet, Take 1 tablet (500 mg total) by mouth 2 (two) times daily.   Prenatal Vit-Fe Fumarate-FA (PRENATAL PO), Take 1 tablet by mouth daily.   Prenatal-FeFum-FA-DHA w/o A (PRENATAL + DHA PO), Take 1 tablet by mouth daily.   UNABLE TO FIND, Take 1 Dose by mouth daily. Med Name: Mullein- drinks for seizures   UNABLE TO FIND, Take 2 capsules by mouth daily. Med Name: Zyflamend, 2 capsules once daily per patient   Reviewed prior external information including notes and imaging from  primary care provider As well as notes that were available from care everywhere and other healthcare systems.  Past medical history, social, surgical and family history all reviewed in electronic medical record.  No pertanent information unless stated regarding to the chief complaint.   Review of Systems:  No headache, visual changes, nausea, vomiting, diarrhea, constipation, dizziness, abdominal pain, skin rash, fevers, chills, night sweats, weight loss, swollen lymph nodes, body aches, joint swelling, chest pain, shortness of breath, mood changes. POSITIVE muscle aches  Objective  unknown if currently breastfeeding.   General: No apparent distress alert and oriented x3 mood and affect normal, dressed appropriately.  HEENT: Pupils equal, extraocular movements intact  Respiratory: Patient's speak in full sentences and does not appear short of breath  Cardiovascular: No lower extremity edema, non tender, no erythema      Impression and Recommendations:    The above documentation has been reviewed and is accurate and complete Judi Saa, DO

## 2022-01-15 ENCOUNTER — Ambulatory Visit: Payer: Medicaid Other | Admitting: Family Medicine

## 2022-01-21 ENCOUNTER — Ambulatory Visit (HOSPITAL_BASED_OUTPATIENT_CLINIC_OR_DEPARTMENT_OTHER): Payer: Managed Care, Other (non HMO) | Attending: Obstetrics and Gynecology | Admitting: Physical Therapy

## 2022-01-22 ENCOUNTER — Encounter: Payer: Self-pay | Admitting: Family Medicine

## 2022-01-24 MED ORDER — GABAPENTIN 400 MG PO CAPS: 400.0000 mg | ORAL_CAPSULE | Freq: Three times a day (TID) | ORAL | 1 refills | Status: DC

## 2022-02-06 NOTE — Progress Notes (Deleted)
[facility] [address] [place] [address] Phone: [phone] Subjective:    I'm seeing this patient by the request  of:  Pcp, No  CC:   [id number]  12/09/2021 Patient does have some difficulty having noted with hypermobility.  Patient has been diagnosed with just the hypermobility previously.  Beighton score of 9 noted today.  Patient has a lot of of underlying depression anxiety and a lot of stress in her life that could also be contributing to amplifying some of the pain.  We discussed at this point patient has had an MRI of the spine that is completely unremarkable.  Patient wants to avoid significant number of medications and we discussed some over-the-counter natural supplements that could be helpful.  Work with Web designer and home exercises in greater detail and more stability.  Follow-up again in 4 to 8 weeks otherwise.  Encourage patient to have an ophthalmology exam and has done echocardiogram recently that was normal. Total time reviewing outside records, outside imaging was discussed in the notes as well as discussing with patient and significant other 58 minutes  Updated 02/11/2022 Angela Woodard is a 31 y.o. female coming in with complaint of EDS       Past Medical History:  Diagnosis Date   Arthritis    Celiac disease    Depression    Ehlers-Danlos, hypermobile type    Normal echo 2021   Fibromyalgia    History of kidney stones    Mast cell activation syndrome (HCC)    Migraine    Nephropathy    OCD (obsessive compulsive disorder)    POTS (postural orthostatic tachycardia syndrome)    Pruritus    Normal bile acids 06/06/2021   Seizure disorder (HCC)    Last reported event 05/15/2021- during pt sleep   Past Surgical History:  Procedure Laterality Date   TYMPANOSTOMY     x3   Social History   Socioeconomic History   Marital status: Married    Spouse name: Psychologist, clinical   Number of children: 1   Years of  education: some colle   Highest education level: Not on file  Occupational History   Occupation: Engineer, water at [employer]  Tobacco Use   Smoking status: Never   Smokeless tobacco: Never  Vaping Use   Vaping Use: Former  Substance and Sexual Activity   Alcohol use: Not Currently    Comment: occasional 1-2 per week   Drug use: No   Sexual activity: Yes    Birth control/protection: Condom  Other Topics Concern   Not on file  Social History Narrative   Lives with husband and 2 adopted special needs children and 10 month old   Caffeine use: 1 serving daily   Social Determinants of Corporate investment banker Strain: Not on file  Food Insecurity: Not on file  Transportation Needs: Not on file  Physical Activity: Not on file  Stress: Not on file  Social Connections: Not on file   Allergies  Allergen Reactions   Relpax [Eletriptan] Anaphylaxis    Throat swelling   Sulfa Antibiotics Hives and Rash   Gluten Meal    Latex Hives   Lamotrigine Rash   Family History  Problem Relation Age of Onset   Throat cancer Father    Melanoma Father    Hypertension Mother    Heart disease Paternal Grandfather    Lung cancer Paternal Grandfather    Obesity Paternal Actor  Diabetes Paternal Grandfather    Memory loss Maternal Grandmother    Colon cancer Maternal Grandmother    Anxiety disorder Sister    Seizures Neg Hx      Current Outpatient Medications (Cardiovascular):    propranolol (INDERAL) 10 MG tablet, Take 10 mg by mouth 3 (three) times daily.  Current Outpatient Medications (Respiratory):    cetirizine (ZYRTEC) 10 MG tablet, Take 10 mg by mouth daily.  Current Outpatient Medications (Analgesics):    aspirin 81 MG chewable tablet, Chew 81 mg by mouth daily.  Current Outpatient Medications (Hematological):    folic acid (FOLVITE) 1 MG tablet, Take 4 mg by mouth daily.  Current Outpatient Medications (Other):    gabapentin (NEURONTIN) 400  MG capsule, Take 1 capsule (400 mg total) by mouth 3 (three) times daily.   lacosamide (VIMPAT) 200 MG TABS tablet, Take 100 mg by mouth 2 (two) times daily.   levETIRAcetam (KEPPRA) 500 MG tablet, Take 1 tablet (500 mg total) by mouth 2 (two) times daily.   Prenatal Vit-Fe Fumarate-FA (PRENATAL PO), Take 1 tablet by mouth daily.   Prenatal-FeFum-FA-DHA w/o A (PRENATAL + DHA PO), Take 1 tablet by mouth daily.   UNABLE TO FIND, Take 1 Dose by mouth daily. Med Name: Mullein- drinks for seizures   UNABLE TO FIND, Take 2 capsules by mouth daily. Med Name: Zyflamend, 2 capsules once daily per patient   Reviewed prior external information including notes and imaging from  primary care provider As well as notes that were available from care everywhere and other healthcare systems.  Past medical history, social, surgical and family history all reviewed in electronic medical record.  No pertanent information unless stated regarding to the chief complaint.   Review of Systems:  No headache, visual changes, nausea, vomiting, diarrhea, constipation, dizziness, abdominal pain, skin rash, fevers, chills, night sweats, weight loss, swollen lymph nodes, body aches, joint swelling, chest pain, shortness of breath, mood changes. POSITIVE muscle aches  Objective  unknown if currently breastfeeding.   General: No apparent distress alert and oriented x3 mood and affect normal, dressed appropriately.  HEENT: Pupils equal, extraocular movements intact  Respiratory: Patient's speak in full sentences and does not appear short of breath  Cardiovascular: No lower extremity edema, non tender, no erythema      Impression and Recommendations:

## 2022-02-11 ENCOUNTER — Ambulatory Visit: Payer: Medicaid Other | Admitting: Family Medicine

## 2022-02-26 NOTE — Progress Notes (Deleted)
[facility] [address] [place] [address] Phone: [phone] Subjective:    I'm seeing this patient by the request  of:  Pcp, No  CC:   [id number]  12/09/2021 Patient does have some difficulty having noted with hypermobility.  Patient has been diagnosed with just the hypermobility previously.  Beighton score of 9 noted today.  Patient has a lot of of underlying depression anxiety and a lot of stress in her life that could also be contributing to amplifying some of the pain.  We discussed at this point patient has had an MRI of the spine that is completely unremarkable.  Patient wants to avoid significant number of medications and we discussed some over-the-counter natural supplements that could be helpful.  Work with Web designer and home exercises in greater detail and more stability.  Follow-up again in 4 to 8 weeks otherwise.  Encourage patient to have an ophthalmology exam and has done echocardiogram recently that was normal. Total time reviewing outside records, outside imaging was discussed in the notes as well as discussing with patient and significant other 58 minutes  Updated 02/27/2022 Angela Woodard is a 31 y.o. female coming in with complaint of EDS       Past Medical History:  Diagnosis Date   Arthritis    Celiac disease    Depression    Ehlers-Danlos, hypermobile type    Normal echo 2021   Fibromyalgia    History of kidney stones    Mast cell activation syndrome (HCC)    Migraine    Nephropathy    OCD (obsessive compulsive disorder)    POTS (postural orthostatic tachycardia syndrome)    Pruritus    Normal bile acids 06/06/2021   Seizure disorder (HCC)    Last reported event 05/15/2021- during pt sleep   Past Surgical History:  Procedure Laterality Date   TYMPANOSTOMY     x3   Social History   Socioeconomic History   Marital status: Married    Spouse name: Psychologist, clinical   Number of children: 1   Years of  education: some colle   Highest education level: Not on file  Occupational History   Occupation: Engineer, water at [employer]  Tobacco Use   Smoking status: Never   Smokeless tobacco: Never  Vaping Use   Vaping Use: Former  Substance and Sexual Activity   Alcohol use: Not Currently    Comment: occasional 1-2 per week   Drug use: No   Sexual activity: Yes    Birth control/protection: Condom  Other Topics Concern   Not on file  Social History Narrative   Lives with husband and 2 adopted special needs children and 43 month old   Caffeine use: 1 serving daily   Social Determinants of Corporate investment banker Strain: Not on file  Food Insecurity: Not on file  Transportation Needs: Not on file  Physical Activity: Not on file  Stress: Not on file  Social Connections: Not on file   Allergies  Allergen Reactions   Relpax [Eletriptan] Anaphylaxis    Throat swelling   Sulfa Antibiotics Hives and Rash   Gluten Meal    Latex Hives   Lamotrigine Rash   Family History  Problem Relation Age of Onset   Throat cancer Father    Melanoma Father    Hypertension Mother    Heart disease Paternal Grandfather    Lung cancer Paternal Grandfather    Obesity Paternal Actor  Diabetes Paternal Grandfather    Memory loss Maternal Grandmother    Colon cancer Maternal Grandmother    Anxiety disorder Sister    Seizures Neg Hx      Current Outpatient Medications (Cardiovascular):    propranolol (INDERAL) 10 MG tablet, Take 10 mg by mouth 3 (three) times daily.  Current Outpatient Medications (Respiratory):    cetirizine (ZYRTEC) 10 MG tablet, Take 10 mg by mouth daily.  Current Outpatient Medications (Analgesics):    aspirin 81 MG chewable tablet, Chew 81 mg by mouth daily.  Current Outpatient Medications (Hematological):    folic acid (FOLVITE) 1 MG tablet, Take 4 mg by mouth daily.  Current Outpatient Medications (Other):    gabapentin (NEURONTIN) 400  MG capsule, Take 1 capsule (400 mg total) by mouth 3 (three) times daily.   lacosamide (VIMPAT) 200 MG TABS tablet, Take 100 mg by mouth 2 (two) times daily.   levETIRAcetam (KEPPRA) 500 MG tablet, Take 1 tablet (500 mg total) by mouth 2 (two) times daily.   Prenatal Vit-Fe Fumarate-FA (PRENATAL PO), Take 1 tablet by mouth daily.   Prenatal-FeFum-FA-DHA w/o A (PRENATAL + DHA PO), Take 1 tablet by mouth daily.   UNABLE TO FIND, Take 1 Dose by mouth daily. Med Name: Mullein- drinks for seizures   UNABLE TO FIND, Take 2 capsules by mouth daily. Med Name: Zyflamend, 2 capsules once daily per patient   Reviewed prior external information including notes and imaging from  primary care provider As well as notes that were available from care everywhere and other healthcare systems.  Past medical history, social, surgical and family history all reviewed in electronic medical record.  No pertanent information unless stated regarding to the chief complaint.   Review of Systems:  No headache, visual changes, nausea, vomiting, diarrhea, constipation, dizziness, abdominal pain, skin rash, fevers, chills, night sweats, weight loss, swollen lymph nodes, body aches, joint swelling, chest pain, shortness of breath, mood changes. POSITIVE muscle aches  Objective  unknown if currently breastfeeding.   General: No apparent distress alert and oriented x3 mood and affect normal, dressed appropriately.  HEENT: Pupils equal, extraocular movements intact  Respiratory: Patient's speak in full sentences and does not appear short of breath  Cardiovascular: No lower extremity edema, non tender, no erythema      Impression and Recommendations:

## 2022-02-27 ENCOUNTER — Ambulatory Visit: Payer: Medicaid Other | Admitting: Family Medicine

## 2022-04-16 NOTE — Progress Notes (Signed)
[facility] Phone: [phone] Subjective:   Angela [facility], am serving as a scribe for Dr. Hulan Saas.  I'm seeing this patient by the request  of:  Pcp, Woodard  CC: Hypermobility and pain follow-up  [id number]  12/09/2021 Patient does have some difficulty having noted with hypermobility.  Patient has been diagnosed with just the hypermobility previously.  Beighton score of 9 noted today.  Patient has a lot of of underlying depression anxiety and a lot of stress in her life that could also be contributing to amplifying some of the pain.  We discussed at this point patient has had an MRI of the spine that is completely unremarkable.  Patient wants to avoid significant number of medications and we discussed some over-the-counter natural supplements that could be helpful.  Work with Research scientist (life sciences) and home exercises in greater detail and more stability.  Follow-up again in 4 to 8 weeks otherwise.  Encourage patient to have an ophthalmology exam and has done echocardiogram recently that was normal. Total time reviewing outside records, outside imaging was discussed in the notes as well as discussing with patient and significant other 58 minutes  Update 04/17/2022 Angela Woodard is a 31 y.o. female coming in with complaint of hypermobility.  Patient was seen previously and exercises.  Patient was to start over-the-counter medications as well as continuing today.  Patient states that her back continues to be painful. 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 Would like to discuss ways to be able to dance. 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 Patient has been taking collagen supplement and working on gut health which has been helpful.        Past Medical History:  Diagnosis Date   Arthritis    Celiac disease    Depression    Ehlers-Danlos,  hypermobile type    Normal echo 2021   Fibromyalgia    History of kidney stones    Mast cell activation syndrome (HCC)    Migraine    Nephropathy    OCD (obsessive compulsive disorder)    POTS (postural orthostatic tachycardia syndrome)    Pruritus    Normal bile acids 06/06/2021   Seizure disorder ([place])    Last reported event 05/15/2021- during pt sleep   Past Surgical History:  Procedure Laterality Date   TYMPANOSTOMY     x3   Social History   Socioeconomic History   Marital status: Married    Spouse name: Ecologist   Number of children: 1   Years of education: some colle   Highest education level: Not on file  Occupational History   Occupation: Licensed conveyancer at [employer]  Tobacco Use   Smoking status: Never   Smokeless tobacco: Never  Vaping Use   Vaping Use: Former  Substance and Sexual Activity   Alcohol use: Not Currently    Comment: occasional 1-2 per week   Drug use: Woodard   Sexual activity: Yes    Birth control/protection: Condom  Other Topics Concern   Not on file  Social History Narrative   Lives with husband and 2 adopted special needs children and 94 month old   Caffeine use: 1 serving daily   Social Determinants of Health   Financial Resource Strain: Not on file  Food Insecurity: Not on file  Transportation Needs: Not on file  Physical  Activity: Not on file  Stress: Not on file  Social Connections: Not on file   Allergies  Allergen Reactions   Relpax [Eletriptan] Anaphylaxis    Throat swelling   Sulfa Antibiotics Hives and Rash   Gluten Meal    Latex Hives   Lamotrigine Rash   Family History  Problem Relation Age of Onset   Throat cancer Father    Melanoma Father    Hypertension Mother    Heart disease Paternal Grandfather    Lung cancer Paternal Grandfather    Obesity Paternal Grandfather    Diabetes Paternal Grandfather    Memory loss Maternal Grandmother    Colon cancer Maternal Grandmother    Anxiety disorder  Sister    Seizures Neg Hx      Current Outpatient Medications (Cardiovascular):    propranolol (INDERAL) 10 MG tablet, Take 10 mg by mouth 3 (three) times daily.  Current Outpatient Medications (Respiratory):    cetirizine (ZYRTEC) 10 MG tablet, Take 10 mg by mouth daily.   montelukast (SINGULAIR) 10 MG tablet, Take 1 tablet (10 mg total) by mouth at bedtime.  Current Outpatient Medications (Analgesics):    aspirin 81 MG chewable tablet, Chew 81 mg by mouth daily.  Current Outpatient Medications (Hematological):    folic acid (FOLVITE) 1 MG tablet, Take 4 mg by mouth daily.  Current Outpatient Medications (Other):    lacosamide (VIMPAT) 200 MG TABS tablet, Take 100 mg by mouth 2 (two) times daily.   UNABLE TO FIND, Take 2 capsules by mouth daily. Med Name: Zyflamend, 2 capsules once daily per patient   gabapentin (NEURONTIN) 400 MG capsule, Take 1 capsule (400 mg total) by mouth 3 (three) times daily.   levETIRAcetam (KEPPRA) 500 MG tablet, Take 1 tablet (500 mg total) by mouth 2 (two) times daily.   Prenatal Vit-Fe Fumarate-FA (PRENATAL PO), Take 1 tablet by mouth daily.   Prenatal-FeFum-FA-DHA w/o A (PRENATAL + DHA PO), Take 1 tablet by mouth daily.   UNABLE TO FIND, Take 1 Dose by mouth daily. Med Name: Ethlyn Daniels- drinks for seizures   Reviewed prior external information including notes and imaging from  primary care provider As well as notes that were available from care everywhere and other healthcare systems.  Past medical history, social, surgical and family history all reviewed in electronic medical record.  Woodard pertanent information unless stated regarding to the chief complaint.   Review of Systems:  Woodard headache, visual changes, nausea, vomiting, diarrhea, constipation, dizziness, abdominal pain, skin rash, fevers, chills, night sweats, weight loss, swollen lymph nodes, joint swelling, chest pain, shortness of breath, mood changes. POSITIVE muscle aches, body  aches  Objective  Blood pressure 108/72, pulse 94, height 5\' 4"  (1.626 m), weight 187 lb (84.8 kg), SpO2 97 %, unknown if currently breastfeeding.   General: Woodard apparent distress alert and oriented x3 mood and affect normal, dressed appropriately.  HEENT: Pupils equal, extraocular movements intact  Respiratory: Patient's speak in full sentences and does not appear short of breath  Cardiovascular: Woodard lower extremity edema, non tender, Woodard erythema  Significant hypermobility noted.  Patient continues to adjust herself in the seat secondary to being uncomfortable.  Still seems to have poor core strength noted.    Impression and Recommendations:    The above documentation has been reviewed and is accurate and complete , DO

## 2022-04-17 ENCOUNTER — Encounter: Payer: Self-pay | Admitting: Family Medicine

## 2022-04-17 ENCOUNTER — Ambulatory Visit (INDEPENDENT_AMBULATORY_CARE_PROVIDER_SITE_OTHER): Payer: Medicaid Other | Admitting: Family Medicine

## 2022-04-17 VITALS — BP 108/72 | HR 94 | Ht 64.0 in | Wt 187.0 lb

## 2022-04-17 DIAGNOSIS — Q796 Ehlers-Danlos syndrome, unspecified: Secondary | ICD-10-CM | POA: Diagnosis not present

## 2022-04-17 DIAGNOSIS — M2550 Pain in unspecified joint: Secondary | ICD-10-CM | POA: Diagnosis not present

## 2022-04-17 LAB — URIC ACID: Uric Acid, Serum: 5.2 mg/dL (ref 2.4–7.0)

## 2022-04-17 LAB — CBC WITH DIFFERENTIAL/PLATELET
Basophils Absolute: 0 10*3/uL (ref 0.0–0.1)
Basophils Relative: 0.3 % (ref 0.0–3.0)
Eosinophils Absolute: 0.1 10*3/uL (ref 0.0–0.7)
Eosinophils Relative: 0.8 % (ref 0.0–5.0)
HCT: 40.6 % (ref 36.0–46.0)
Hemoglobin: 13.7 g/dL (ref 12.0–15.0)
Lymphocytes Relative: 32.5 % (ref 12.0–46.0)
Lymphs Abs: 2.4 10*3/uL (ref 0.7–4.0)
MCHC: 33.8 g/dL (ref 30.0–36.0)
MCV: 94.4 fl (ref 78.0–100.0)
Monocytes Absolute: 0.4 10*3/uL (ref 0.1–1.0)
Monocytes Relative: 5.4 % (ref 3.0–12.0)
Neutro Abs: 4.5 10*3/uL (ref 1.4–7.7)
Neutrophils Relative %: 61 % (ref 43.0–77.0)
Platelets: 230 10*3/uL (ref 150.0–400.0)
RBC: 4.3 Mil/uL (ref 3.87–5.11)
RDW: 13.6 % (ref 11.5–15.5)
WBC: 7.3 10*3/uL (ref 4.0–10.5)

## 2022-04-17 LAB — VITAMIN B12: Vitamin B-12: 468 pg/mL (ref 211–911)

## 2022-04-17 LAB — COMPREHENSIVE METABOLIC PANEL
ALT: 11 U/L (ref 0–35)
AST: 14 U/L (ref 0–37)
Albumin: 4.1 g/dL (ref 3.5–5.2)
Alkaline Phosphatase: 65 U/L (ref 39–117)
BUN: 13 mg/dL (ref 6–23)
CO2: 26 mEq/L (ref 19–32)
Calcium: 9.3 mg/dL (ref 8.4–10.5)
Chloride: 105 mEq/L (ref 96–112)
Creatinine, Ser: 0.69 mg/dL (ref 0.40–1.20)
GFR: 116.27 mL/min (ref >60.00)
Glucose, Bld: 82 mg/dL (ref 70–99)
Potassium: 3.7 mEq/L (ref 3.5–5.1)
Sodium: 140 mEq/L (ref 135–145)
Total Bilirubin: 0.3 mg/dL (ref 0.2–1.2)
Total Protein: 7.2 g/dL (ref 6.0–8.3)

## 2022-04-17 LAB — IBC PANEL
Iron: 83 ug/dL (ref 42–145)
Saturation Ratios: 23.8 % (ref 20.0–50.0)
TIBC: 348.6 ug/dL (ref 250.0–450.0)
Transferrin: 249 mg/dL (ref 212.0–360.0)

## 2022-04-17 LAB — TSH: TSH: 1.05 u[IU]/mL (ref 0.35–5.50)

## 2022-04-17 LAB — VITAMIN D 25 HYDROXY (VIT D DEFICIENCY, FRACTURES): VITD: 45.46 ng/mL (ref 30.00–100.00)

## 2022-04-17 LAB — FERRITIN: Ferritin: 30.5 ng/mL (ref 10.0–291.0)

## 2022-04-17 LAB — C-REACTIVE PROTEIN: CRP: <1 mg/dL (ref 0.5–20.0)

## 2022-04-17 LAB — SEDIMENTATION RATE: Sed Rate: 21 mm/hr — ABNORMAL HIGH (ref 0–20)

## 2022-04-17 MED ORDER — GABAPENTIN 400 MG PO CAPS: 400.0000 mg | ORAL_CAPSULE | Freq: Three times a day (TID) | ORAL | 1 refills | Status: DC

## 2022-04-17 MED ORDER — MONTELUKAST SODIUM 10 MG PO TABS: 10.0000 mg | ORAL_TABLET | Freq: Every day | ORAL | 0 refills | Status: DC

## 2022-04-17 NOTE — Assessment & Plan Note (Addendum)
Continues on chronic pain and could be also secondary to some of the celiac disease as well as patient's fibromyalgia.  Do feel laboratory work-up could be beneficial at this point to see if anything else is contributing.  Discussed icing regimen and home exercises.  We discussed over-the-counter medications still.  Could consider with gabapentin still at 400 mg 3 times a day and refilled at the moment.  Follow-up with me again 2 months total time discussed with patient 33 minutes

## 2022-04-17 NOTE — Patient Instructions (Addendum)
Labs today Singulair 10mg  prescribed Gabapentin refilled

## 2022-04-19 LAB — CALCIUM, IONIZED: Calcium, Ion: 5.2 mg/dL (ref 4.7–5.5)

## 2022-04-19 LAB — THYROID PEROXIDASE ANTIBODIES (TPO) (REFL): Thyroperoxidase Ab SerPl-aCnc: <1 IU/mL (ref <9)

## 2022-04-19 LAB — RHEUMATOID FACTOR: Rheumatoid fact SerPl-aCnc: <14 IU/mL (ref <14)

## 2022-04-19 LAB — PTH, INTACT AND CALCIUM
Calcium: 9.2 mg/dL (ref 8.6–10.2)
PTH: 16 pg/mL (ref 16–77)

## 2022-04-19 LAB — ANA: Anti Nuclear Antibody (ANA): NEGATIVE

## 2022-04-19 LAB — ANGIOTENSIN CONVERTING ENZYME: Angiotensin-Converting Enzyme: 18 U/L (ref 9–67)

## 2022-04-19 LAB — CYCLIC CITRUL PEPTIDE ANTIBODY, IGG: Cyclic Citrullin Peptide Ab: <16 UNITS

## 2022-04-22 ENCOUNTER — Telehealth: Payer: Medicaid Other | Admitting: Emergency Medicine

## 2022-04-22 DIAGNOSIS — J019 Acute sinusitis, unspecified: Secondary | ICD-10-CM | POA: Diagnosis not present

## 2022-04-22 DIAGNOSIS — B9689 Other specified bacterial agents as the cause of diseases classified elsewhere: Secondary | ICD-10-CM

## 2022-04-22 MED ORDER — AMOXICILLIN-POT CLAVULANATE 875-125 MG PO TABS: 1.0000 | ORAL_TABLET | Freq: Two times a day (BID) | ORAL | 0 refills | Status: DC

## 2022-04-22 NOTE — Progress Notes (Signed)
E-Visit for Sinus Problems  We are sorry that you are not feeling well.  Here is how we plan to help!  Based on what you have shared with me it looks like you have sinusitis.  Sinusitis is inflammation and infection in the sinus cavities of the head.  Based on your presentation I believe you most likely have Acute Bacterial Sinusitis.  This is an infection caused by bacteria and is treated with antibiotics. I have prescribed Augmentin 875mg/125mg one tablet twice daily with food, for 7 days.   You may use an oral decongestant such as Mucinex D or if you have glaucoma or high blood pressure use plain Mucinex.   Saline nasal spray help and can safely be used as often as needed for congestion.  Try using saline irrigation, such as with a neti pot, several times a day while you are sick. Many neti pots come with salt packets premeasured to use to make saline. If you use your own salt, make sure it is kosher salt or sea salt (don't use table salt as it has iodine in it and you don't need that in your nose). Use distilled water to make saline. If you mix your own saline using your own salt, the recipe is 1/4 teaspoon salt in 1 cup warm water. Using saline irrigation can help prevent and treat sinus infections.   If you develop worsening sinus pain, fever or notice severe headache and vision changes, or if symptoms are not better after completion of antibiotic, please schedule an appointment with a health care provider.    Sinus infections are not as easily transmitted as other respiratory infection, however we still recommend that you avoid close contact with loved ones, especially the very young and elderly.  Remember to wash your hands thoroughly throughout the day as this is the number one way to prevent the spread of infection!  Home Care: Only take medications as instructed by your medical team. Complete the entire course of an antibiotic. Do not take these medications with alcohol. A steam or  ultrasonic humidifier can help congestion.  You can place a towel over your head and breathe in the steam from hot water coming from a faucet. Avoid close contacts especially the very young and the elderly. Cover your mouth when you cough or sneeze. Always remember to wash your hands.  Get Help Right Away If: You develop worsening fever or sinus pain. You develop a severe head ache or visual changes. Your symptoms persist after you have completed your treatment plan.  Make sure you Understand these instructions. Will watch your condition. Will get help right away if you are not doing well or get worse.  Thank you for choosing an e-visit.  Your e-visit answers were reviewed by a board certified advanced clinical practitioner to complete your personal care plan. Depending upon the condition, your plan could have included both over the counter or prescription medications.  Please review your pharmacy choice. Make sure the pharmacy is open so you can pick up prescription now. If there is a problem, you may contact your provider through MyChart messaging and have the prescription routed to another pharmacy.  Your safety is important to us. If you have drug allergies check your prescription carefully.   For the next 24 hours you can use MyChart to ask questions about today's visit, request a non-urgent call back, or ask for a work or school excuse. You will get an email in the next two days asking   about your experience. I hope that your e-visit has been valuable and will speed your recovery.  I have spent 5 minutes in review of e-visit questionnaire, review and updating patient chart, medical decision making and response to patient.   Kaylin Marcon, PhD, FNP-BC   

## 2022-04-23 ENCOUNTER — Ambulatory Visit (HOSPITAL_BASED_OUTPATIENT_CLINIC_OR_DEPARTMENT_OTHER): Payer: Medicaid Other | Admitting: Orthopaedic Surgery

## 2022-04-25 ENCOUNTER — Ambulatory Visit: Payer: Medicaid Other | Admitting: Podiatry

## 2022-04-30 ENCOUNTER — Ambulatory Visit: Payer: Medicaid Other | Admitting: Family Medicine

## 2022-05-02 ENCOUNTER — Encounter: Payer: Medicaid Other | Admitting: Radiology

## 2022-05-07 ENCOUNTER — Ambulatory Visit: Payer: Medicaid Other | Admitting: Podiatry

## 2022-05-07 ENCOUNTER — Encounter: Payer: Self-pay | Admitting: Radiology

## 2022-05-08 ENCOUNTER — Ambulatory Visit (HOSPITAL_BASED_OUTPATIENT_CLINIC_OR_DEPARTMENT_OTHER): Payer: Medicaid Other | Admitting: Orthopaedic Surgery

## 2022-05-09 ENCOUNTER — Ambulatory Visit: Payer: Medicaid Other | Admitting: Nurse Practitioner

## 2022-05-19 ENCOUNTER — Ambulatory Visit: Payer: Medicaid Other | Admitting: Podiatry

## 2022-05-21 ENCOUNTER — Ambulatory Visit: Payer: Medicaid Other | Admitting: Podiatry

## 2022-06-17 NOTE — Progress Notes (Deleted)
[name] [address] [place] [address] Phone: [phone] Subjective:    I'm seeing this patient by the request  of:  Pcp, No  CC:   [id number]  04/17/2022 Continues on chronic pain and could be also secondary to some of the celiac disease as well as patient's fibromyalgia.  Do feel laboratory work-up could be beneficial at this point to see if anything else is contributing.  Discussed icing regimen and home exercises.  We discussed over-the-counter medications still.  Could consider with gabapentin still at 400 mg 3 times a day and refilled at the moment.  Follow-up with me again 2 months total time discussed with patient 33 minutes      Update 06/19/2022 Angela Woodard is a 31 y.o. female coming in with complaint of polyarthralgia. Patient states        Past Medical History:  Diagnosis Date   Arthritis    Celiac disease    Depression    Ehlers-Danlos, hypermobile type    Normal echo 2021   Fibromyalgia    History of kidney stones    Mast cell activation syndrome (HCC)    Migraine    Nephropathy    OCD (obsessive compulsive disorder)    POTS (postural orthostatic tachycardia syndrome)    Pruritus    Normal bile acids 06/06/2021   Seizure disorder (HCC)    Last reported event 05/15/2021- during pt sleep   Past Surgical History:  Procedure Laterality Date   TYMPANOSTOMY     x3   Social History   Socioeconomic History   Marital status: Married    Spouse name: Psychologist, clinical   Number of children: 1   Years of education: some colle   Highest education level: Not on file  Occupational History   Occupation: Engineer, water at [employer]  Tobacco Use   Smoking status: Never   Smokeless tobacco: Never  Vaping Use   Vaping Use: Former  Substance and Sexual Activity   Alcohol use: Not Currently    Comment: occasional 1-2 per week   Drug use: No   Sexual activity: Yes    Birth control/protection: Condom   Other Topics Concern   Not on file  Social History Narrative   Lives with husband and 2 adopted special needs children and 64 month old   Caffeine use: 1 serving daily   Social Determinants of Corporate investment banker Strain: Not on file  Food Insecurity: Not on file  Transportation Needs: Not on file  Physical Activity: Not on file  Stress: Not on file  Social Connections: Not on file   Allergies  Allergen Reactions   Relpax [Eletriptan] Anaphylaxis    Throat swelling   Sulfa Antibiotics Hives and Rash   Gluten Meal    Latex Hives   Lamotrigine Rash   Family History  Problem Relation Age of Onset   Throat cancer Father    Melanoma Father    Hypertension Mother    Heart disease Paternal Grandfather    Lung cancer Paternal Grandfather    Obesity Paternal Grandfather    Diabetes Paternal Grandfather    Memory loss Maternal Grandmother    Colon cancer Maternal Grandmother    Anxiety disorder Sister    Seizures Neg Hx      Current Outpatient Medications (Cardiovascular):    propranolol (INDERAL) 10 MG tablet, Take 10 mg by mouth 3 (three) times daily.  Current Outpatient Medications (Respiratory):    cetirizine (  ZYRTEC) 10 MG tablet, Take 10 mg by mouth daily.   montelukast (SINGULAIR) 10 MG tablet, Take 1 tablet (10 mg total) by mouth at bedtime.  Current Outpatient Medications (Analgesics):    aspirin 81 MG chewable tablet, Chew 81 mg by mouth daily.  Current Outpatient Medications (Hematological):    folic acid (FOLVITE) 1 MG tablet, Take 4 mg by mouth daily.  Current Outpatient Medications (Other):    amoxicillin-clavulanate (AUGMENTIN) 875-125 MG tablet, Take 1 tablet by mouth 2 (two) times daily.   gabapentin (NEURONTIN) 400 MG capsule, Take 1 capsule (400 mg total) by mouth 3 (three) times daily.   lacosamide (VIMPAT) 200 MG TABS tablet, Take 100 mg by mouth 2 (two) times daily.   levETIRAcetam (KEPPRA) 500 MG tablet, Take 1 tablet (500 mg total) by  mouth 2 (two) times daily.   Prenatal Vit-Fe Fumarate-FA (PRENATAL PO), Take 1 tablet by mouth daily.   Prenatal-FeFum-FA-DHA w/o A (PRENATAL + DHA PO), Take 1 tablet by mouth daily.   UNABLE TO FIND, Take 1 Dose by mouth daily. Med Name: [place]- drinks for seizures   UNABLE TO FIND, Take 2 capsules by mouth daily. Med Name: Zyflamend, 2 capsules once daily per patient   Reviewed prior external information including notes and imaging from  primary care provider As well as notes that were available from care everywhere and other healthcare systems.  Past medical history, social, surgical and family history all reviewed in electronic medical record.  No pertanent information unless stated regarding to the chief complaint.   Review of Systems:  No headache, visual changes, nausea, vomiting, diarrhea, constipation, dizziness, abdominal pain, skin rash, fevers, chills, night sweats, weight loss, swollen lymph nodes, body aches, joint swelling, chest pain, shortness of breath, mood changes. POSITIVE muscle aches  Objective  unknown if currently breastfeeding.   General: No apparent distress alert and oriented x3 mood and affect normal, dressed appropriately.  HEENT: Pupils equal, extraocular movements intact  Respiratory: Patient's speak in full sentences and does not appear short of breath  Cardiovascular: No lower extremity edema, non tender, no erythema      Impression and Recommendations:

## 2022-06-18 ENCOUNTER — Ambulatory Visit: Payer: Medicaid Other | Admitting: Family Medicine

## 2022-06-19 ENCOUNTER — Ambulatory Visit: Payer: Medicaid Other | Admitting: Family Medicine

## 2022-06-25 ENCOUNTER — Ambulatory Visit (INDEPENDENT_AMBULATORY_CARE_PROVIDER_SITE_OTHER): Payer: Medicaid Other | Admitting: Family Medicine

## 2022-06-25 VITALS — BP 102/64 | HR 89 | Ht 64.0 in | Wt 180.0 lb

## 2022-06-25 DIAGNOSIS — M9908 Segmental and somatic dysfunction of rib cage: Secondary | ICD-10-CM | POA: Diagnosis not present

## 2022-06-25 DIAGNOSIS — Q796 Ehlers-Danlos syndrome, unspecified: Secondary | ICD-10-CM | POA: Diagnosis not present

## 2022-06-25 DIAGNOSIS — M9904 Segmental and somatic dysfunction of sacral region: Secondary | ICD-10-CM

## 2022-06-25 DIAGNOSIS — M9903 Segmental and somatic dysfunction of lumbar region: Secondary | ICD-10-CM

## 2022-06-25 DIAGNOSIS — M9902 Segmental and somatic dysfunction of thoracic region: Secondary | ICD-10-CM | POA: Diagnosis not present

## 2022-06-25 MED ORDER — VENLAFAXINE HCL ER 37.5 MG PO CP24: 37.5000 mg | ORAL_CAPSULE | Freq: Every day | ORAL | 0 refills | Status: DC

## 2022-06-25 MED ORDER — COLCHICINE 0.6 MG PO TABS: 0.6000 mg | ORAL_TABLET | Freq: Every day | ORAL | 0 refills | Status: DC

## 2022-06-25 NOTE — Assessment & Plan Note (Signed)
Patient does have the hypermobility noted.  He did respond extremely well to osteopathic manipulation.  Does have the underlying fibromyalgia that I think can be contributing as well.  We discussed with patient about different treatment options and patient has elected to try the colchicine of the short course and then if no significant response we will try the Effexor 37.5 mg.  Patient has had difficulty with Lamictal as well as Cymbalta in the past but I am hopeful that this will do relatively well.  We discussed with patient on icing regimen, home exercises otherwise.  Continue core strengthening and limiting certain range of motion.  Follow-up with me again in 6 weeks

## 2022-06-25 NOTE — Progress Notes (Signed)
[name] [address] [place] [address] Phone: [phone] Subjective:    I'm seeing this patient by the request  of:  Pcp, No  CC: Back pain and allover pain follow-up  TIR:[id number]  04/17/2022 Continues on chronic pain and could be also secondary to some of the celiac disease as well as patient's fibromyalgia.  Do feel laboratory work-up could be beneficial at this point to see if anything else is contributing.  Discussed icing regimen and home exercises.  We discussed over-the-counter medications still.  Could consider with gabapentin still at 400 mg 3 times a day and refilled at the moment.  Follow-up with me again 2 months total time discussed with patient 33 minutes   Update 06/25/2022 NORI WINEGAR is a 31 y.o. female coming in with complaint of polyarthralgia. Patient states that she feels the same as last visit. Feels like pain in spine is worsening throughout entire spine. Symptoms for mast cell symptoms are improving after identifying allergens in her home. In general, she feels like she is dislocating more joints recently. Using gabapentin 400mg  TID.  States that it does take the edge off but never without some pain      Past Medical History:  Diagnosis Date   Arthritis    Celiac disease    Depression    Ehlers-Danlos, hypermobile type    Normal echo 2021   Fibromyalgia    History of kidney stones    Mast cell activation syndrome (HCC)    Migraine    Nephropathy    OCD (obsessive compulsive disorder)    POTS (postural orthostatic tachycardia syndrome)    Pruritus    Normal bile acids 06/06/2021   Seizure disorder (HCC)    Last reported event 05/15/2021- during pt sleep   Past Surgical History:  Procedure Laterality Date   TYMPANOSTOMY     x3   Social History   Socioeconomic History   Marital status: Married    Spouse name: 13/03/2021   Number of children: 1   Years of education: some colle   Highest education  level: Not on file  Occupational History   Occupation: Psychologist, clinical at [employer], [employer]  Tobacco Use   Smoking status: Never   Smokeless tobacco: Never  Vaping Use   Vaping Use: Former  Substance and Sexual Activity   Alcohol use: Not Currently    Comment: occasional 1-2 per week   Drug use: No   Sexual activity: Yes    Birth control/protection: Condom  Other Topics Concern   Not on file  Social History Narrative   Lives with husband and 2 adopted special needs children and 51 month old   Caffeine use: 1 serving daily   Social Determinants of 4 Strain: Not on file  Food Insecurity: Not on file  Transportation Needs: Not on file  Physical Activity: Not on file  Stress: Not on file  Social Connections: Not on file   Allergies  Allergen Reactions   Relpax [Eletriptan] Anaphylaxis    Throat swelling   Sulfa Antibiotics Hives and Rash   Gluten Meal    Latex Hives   Lamotrigine Rash   Family History  Problem Relation Age of Onset   Throat cancer Father    Melanoma Father    Hypertension Mother    Heart disease Paternal Grandfather    Lung cancer Paternal Grandfather    Obesity Paternal Grandfather    Diabetes Paternal Corporate investment banker  Memory loss Maternal Grandmother    Colon cancer Maternal Grandmother    Anxiety disorder Sister    Seizures Neg Hx      Current Outpatient Medications (Cardiovascular):    propranolol (INDERAL) 10 MG tablet, Take 10 mg by mouth 3 (three) times daily.  Current Outpatient Medications (Respiratory):    cetirizine (ZYRTEC) 10 MG tablet, Take 10 mg by mouth daily.   montelukast (SINGULAIR) 10 MG tablet, Take 1 tablet (10 mg total) by mouth at bedtime.  Current Outpatient Medications (Analgesics):    colchicine 0.6 MG tablet, Take 1 tablet (0.6 mg total) by mouth daily.   Current Outpatient Medications (Other):    amoxicillin-clavulanate (AUGMENTIN) 875-125 MG tablet, Take 1 tablet by mouth 2  (two) times daily.   gabapentin (NEURONTIN) 400 MG capsule, Take 1 capsule (400 mg total) by mouth 3 (three) times daily.   lacosamide (VIMPAT) 200 MG TABS tablet, Take 100 mg by mouth 2 (two) times daily.   UNABLE TO FIND, Take 2 capsules by mouth daily. Med Name: Zyflamend, 2 capsules once daily per patient   venlafaxine XR (EFFEXOR XR) 37.5 MG 24 hr capsule, Take 1 capsule (37.5 mg total) by mouth daily with breakfast.   Reviewed prior external information including notes and imaging from  primary care provider As well as notes that were available from care everywhere and other healthcare systems.  Past medical history, social, surgical and family history all reviewed in electronic medical record.  No pertanent information unless stated regarding to the chief complaint.   Review of Systems:  No headache, visual changes, nausea, vomiting, diarrhea, constipation, dizziness, abdominal pain, skin rash, fevers, chills, night sweats, weight loss, swollen lymph nodes, body aches, joint swelling, chest pain, shortness of breath, mood changes. POSITIVE muscle aches  Objective  Blood pressure 102/64, pulse 89, height 5\' 4"  (1.626 m), weight 180 lb (81.6 kg), SpO2 98 %, unknown if currently breastfeeding.   General: No apparent distress alert and oriented x3 mood and affect normal, dressed appropriately.  HEENT: Pupils equal, extraocular movements intact  Respiratory: Patient's speak in full sentences and does not appear short of breath  Cardiovascular: No lower extremity edema, non tender, no erythema   Back exam does have some mild tightness noted mostly in the thoracolumbar juncture.  Some tenderness to palpation noted.  Osteopathic findings T3 extended rotated and side bent right inhaled third rib T9 extended rotated and side bent left L2 flexed rotated and side bent right Sacrum right on right     Impression and Recommendations:     Ehlers-Danlos syndrome Patient does have the  hypermobility noted.  He did respond extremely well to osteopathic manipulation.  Does have the underlying fibromyalgia that I think can be contributing as well.  We discussed with patient about different treatment options and patient has elected to try the colchicine of the short course and then if no significant response we will try the Effexor 37.5 mg.  Patient has had difficulty with Lamictal as well as Cymbalta in the past but I am hopeful that this will do relatively well.  We discussed with patient on icing regimen, home exercises otherwise.  Continue core strengthening and limiting certain range of motion.  Follow-up with me again in 6 weeks     Decision today to treat with OMT was based on Physical Exam  After verbal consent patient was treated with HVLA, ME, FPR techniques in  thoracic, rib, lumbar and sacral areas, all areas are chronic  Patient tolerated the procedure well with improvement in symptoms  Patient given exercises, stretches and lifestyle modifications  See medications in patient instructions if given  Patient will follow up in 4-8 weeks  The above documentation has been reviewed and is accurate and complete Judi Saa, DO

## 2022-06-25 NOTE — Patient Instructions (Signed)
Colchicine for 5 days Effexor 37.5 after taking colchicine See me again in 5-6 weeks

## 2022-07-03 ENCOUNTER — Encounter: Payer: Self-pay | Admitting: Family Medicine

## 2022-07-06 ENCOUNTER — Telehealth: Payer: Medicaid Other | Admitting: Family

## 2022-07-06 DIAGNOSIS — M5442 Lumbago with sciatica, left side: Secondary | ICD-10-CM | POA: Diagnosis not present

## 2022-07-06 DIAGNOSIS — M5441 Lumbago with sciatica, right side: Secondary | ICD-10-CM | POA: Diagnosis not present

## 2022-07-06 MED ORDER — PREDNISONE 20 MG PO TABS: 40.0000 mg | ORAL_TABLET | Freq: Every day | ORAL | 0 refills | Status: AC

## 2022-07-06 MED ORDER — NAPROXEN 500 MG PO TABS: 500.0000 mg | ORAL_TABLET | Freq: Two times a day (BID) | ORAL | 0 refills | Status: DC

## 2022-07-06 MED ORDER — BACLOFEN 10 MG PO TABS: 10.0000 mg | ORAL_TABLET | Freq: Three times a day (TID) | ORAL | 0 refills | Status: DC

## 2022-07-06 NOTE — Progress Notes (Signed)

## 2022-07-11 ENCOUNTER — Ambulatory Visit: Payer: Medicaid Other | Admitting: Family Medicine

## 2022-07-12 ENCOUNTER — Encounter: Payer: Self-pay | Admitting: Family Medicine

## 2022-07-18 ENCOUNTER — Other Ambulatory Visit: Payer: Self-pay | Admitting: Family

## 2022-07-22 ENCOUNTER — Other Ambulatory Visit: Payer: Self-pay | Admitting: Family Medicine

## 2022-07-29 NOTE — Progress Notes (Deleted)
  [place] Phone: [phone] Subjective:    I'm seeing this patient by the request  of:  Pcp, No  CC:   [id number]  Angela Woodard is a 32 y.o. female coming in with complaint of back and neck pain. OMT 06/25/2022. Patient states   Medications patient has been prescribed: Effexor, Colchicine  Taking:         Reviewed prior external information including notes and imaging from previsou exam, outside providers and external EMR if available.   As well as notes that were available from care everywhere and other healthcare systems.  Past medical history, social, surgical and family history all reviewed in electronic medical record.  No pertanent information unless stated regarding to the chief complaint.   Past Medical History:  Diagnosis Date   Arthritis    Celiac disease    Depression    Ehlers-Danlos, hypermobile type    Normal echo 2021   Fibromyalgia    History of kidney stones    Mast cell activation syndrome (HCC)    Migraine    Nephropathy    OCD (obsessive compulsive disorder)    POTS (postural orthostatic tachycardia syndrome)    Pruritus    Normal bile acids 06/06/2021   Seizure disorder ([place])    Last reported event 05/15/2021- during pt sleep    Allergies  Allergen Reactions   Relpax [Eletriptan] Anaphylaxis    Throat swelling   Sulfa Antibiotics Hives and Rash   Gluten Meal    Latex Hives   Lamotrigine Rash     Review of Systems:  No headache, visual changes, nausea, vomiting, diarrhea, constipation, dizziness, abdominal pain, skin rash, fevers, chills, night sweats, weight loss, swollen lymph nodes, body aches, joint swelling, chest pain, shortness of breath, mood changes. POSITIVE muscle aches  Objective  unknown if currently breastfeeding.   General: No apparent distress alert and oriented x3 mood and affect normal, dressed appropriately.  HEENT: Pupils equal,  extraocular movements intact  Respiratory: Patient's speak in full sentences and does not appear short of breath  Cardiovascular: No lower extremity edema, non tender, no erythema  Gait MSK:  Back   Osteopathic findings  C2 flexed rotated and side bent right C6 flexed rotated and side bent left T3 extended rotated and side bent right inhaled rib T9 extended rotated and side bent left L2 flexed rotated and side bent right Sacrum right on right       Assessment and Plan:  No problem-specific Assessment & Plan notes found for this encounter.    Nonallopathic problems  Decision today to treat with OMT was based on Physical Exam  After verbal consent patient was treated with HVLA, ME, FPR techniques in cervical, rib, thoracic, lumbar, and sacral  areas  Patient tolerated the procedure well with improvement in symptoms  Patient given exercises, stretches and lifestyle modifications  See medications in patient instructions if given  Patient will follow up in 4-8 weeks             Note: This dictation was prepared with Dragon dictation along with smaller phrase technology. Any transcriptional errors that result from this process are unintentional.

## 2022-07-30 ENCOUNTER — Ambulatory Visit: Payer: Medicaid Other | Admitting: Family Medicine

## 2022-07-31 NOTE — Progress Notes (Signed)
  [place]    [place]   Entered in error

## 2022-08-01 ENCOUNTER — Ambulatory Visit (INDEPENDENT_AMBULATORY_CARE_PROVIDER_SITE_OTHER): Payer: Medicaid Other | Admitting: Family Medicine

## 2022-08-01 ENCOUNTER — Telehealth: Payer: Medicaid Other | Admitting: Physician Assistant

## 2022-08-01 DIAGNOSIS — M545 Low back pain, unspecified: Secondary | ICD-10-CM

## 2022-08-01 DIAGNOSIS — M5431 Sciatica, right side: Secondary | ICD-10-CM

## 2022-08-01 DIAGNOSIS — M5432 Sciatica, left side: Secondary | ICD-10-CM

## 2022-08-01 NOTE — Telephone Encounter (Signed)
Spoke with pt husband, concerned about missing call but has seen the [employer]. They would like a phone visit and thinks this is a misunderstanding as pt symptoms are new.  Informed pt husband that based on his description of hew symptoms, this will not be resolved by a phone visit and possibly an ED visit would be our recommendation.

## 2022-08-01 NOTE — Progress Notes (Signed)
Because you are having "crippling" back pain that has not been evaluated in person, I feel your condition warrants further evaluation and I recommend that you be seen in a face to face visit.   NOTE: There will be NO CHARGE for this eVisit   If you are having a true medical emergency please call 911.      For an urgent face to face visit, [facility] has eight urgent care centers for your convenience:   NEW!! [facility] [place] Get Driving Directions [phone] [address] [place], [place] [facility] [place] [facility] [place] Get Driving Directions [phone] [place], [place] [address]   [facility] [facility] [place] [facility]) Get Driving Directions [phone] [address], [place] [address]  [place] [facility] [place] ([place]) Get Driving Directions [phone] [address] [place],  [place]  [address]  [facility] [place] [facility] at [place] Get Driving Directions  [phone] [address] [place],  [place] [address]   [facility] Get Driving Directions [phone] [address] [place], [place], [place] [address]   [facility] Get Driving Directions  [phone] [address] [place], [place] [address]   [facility] [place] Get Driving Directions [phone] [address], [place], [place] [address]  There is also an Orthopedic Urgent Care that may be easier and more beneficial to be seen at for this issue:  [facility] [address], [place], [place] [address] [place] Monday - Friday: 8:00am to 8:00pm Saturday: 10:00am to 3:00pm [phone]  I do also see you are currently at [facility], this issue can be addressed  there as well.   Your MyChart E-visit questionnaire answers were reviewed by a board certified advanced clinical practitioner to complete your personal care plan based on your specific symptoms.  Thank you for using e-Visits.   I have spent 5 minutes in review of e-visit questionnaire, review and updating patient chart, medical decision making and response to patient.   Mar Daring, PA-C

## 2022-08-04 ENCOUNTER — Other Ambulatory Visit: Payer: Self-pay | Admitting: Family Medicine

## 2022-08-15 NOTE — Progress Notes (Unsigned)
Angela Woodard [address] [place] Phone: [phone] Subjective:   Angela Woodard, am serving as a scribe for Dr. Hulan Saas.  I'm seeing this patient by the request  of:  Pcp, No  CC: back and neck pain   [id number]  Angela Woodard is a 32 y.o. female coming in with complaint of back and neck pain. OMT 06/25/2022. Patient states worse since last visit. Pain in neck and upper back. New meds for low BP and has been having a low heart rate. No other issues.          Reviewed prior external information including notes and imaging from previsou exam, outside providers and external EMR if available.   As well as notes that were available from care everywhere and other healthcare systems.  Past medical history, social, surgical and family history all reviewed in electronic medical record.  No pertanent information unless stated regarding to the chief complaint.   Past Medical History:  Diagnosis Date   Arthritis    Celiac disease    Depression    Ehlers-Danlos, hypermobile type    Normal echo 2021   Fibromyalgia    History of kidney stones    Mast cell activation syndrome (HCC)    Migraine    Nephropathy    OCD (obsessive compulsive disorder)    POTS (postural orthostatic tachycardia syndrome)    Pruritus    Normal bile acids 06/06/2021   Seizure disorder ([facility])    Last reported event 05/15/2021- during pt sleep    Allergies  Allergen Reactions   Relpax [Eletriptan] Anaphylaxis    Throat swelling   Sulfa Antibiotics Hives and Rash   Gluten Meal    Latex Hives   Lamotrigine Rash     Review of Systems:  No headache, visual changes, nausea, vomiting, diarrhea, constipation, dizziness, abdominal pain, skin rash, fevers, chills, night sweats, weight loss, swollen lymph nodes, body aches, joint swelling, chest pain, shortness of breath, mood changes. POSITIVE muscle aches  Objective  Blood pressure 90/68, pulse  63, height 5' 4"$  (1.626 m), weight 174 lb (78.9 kg), SpO2 98 %.   General: No apparent distress alert and oriented x3 mood and affect normal, dressed appropriately.  HEENT: Pupils equal, extraocular movements intact  Respiratory: Patient's speak in full sentences and does not appear short of breath  Cardiovascular: No lower extremity edema, non tender, no erythema  Hypermobility, does have tightness noted  Still thyromegaly noted  Osteopathic findings C7 flexed rotated and side bent left T3 extended rotated and side bent right inhaled rib T8 extended rotated and side bent left L1 flexed rotated and side bent right Sacrum right on right       Assessment and Plan:  Ehlers-Danlos syndrome Instability noted, chronic, with exacerbation  Fibromyalgia as well  Increase effexor noted.  We did a new prescription today.  Hopefully will be beneficial.  Patient will monitor the blood pressure and the heart rate.  Hopefully will help with some of the underlying depression as well.  Will follow-up again in 6 to 8 weeks    Nonallopathic problems  Decision today to treat with OMT was based on Physical Exam  After verbal consent patient was treated with HVLA, ME, FPR techniques in cervical, rib, thoracic, lumbar, and sacral  areas  Patient tolerated the procedure well with improvement in symptoms  Patient given exercises, stretches and lifestyle modifications  See medications in patient instructions if given  Patient  will follow up in 4-8 weeks    The above documentation has been reviewed and is accurate and complete Lyndal Pulley, DO          Note: This dictation was prepared with Dragon dictation along with smaller phrase technology. Any transcriptional errors that result from this process are unintentional.

## 2022-08-17 ENCOUNTER — Telehealth: Payer: Medicaid Other | Admitting: Nurse Practitioner

## 2022-08-17 DIAGNOSIS — B9789 Other viral agents as the cause of diseases classified elsewhere: Secondary | ICD-10-CM

## 2022-08-17 DIAGNOSIS — J069 Acute upper respiratory infection, unspecified: Secondary | ICD-10-CM | POA: Diagnosis not present

## 2022-08-17 MED ORDER — FLUTICASONE PROPIONATE 50 MCG/ACT NA SUSP: 2.0000 | Freq: Every day | NASAL | 0 refills | Status: DC

## 2022-08-17 NOTE — Progress Notes (Signed)

## 2022-08-17 NOTE — Progress Notes (Signed)
E-Visit for Sinus Problems  We are sorry that you are not feeling well.  Here is how we plan to help!  Based on what you have shared with me it looks like you have sinusitis.  Sinusitis is inflammation and infection in the sinus cavities of the head.  Based on your presentation I believe you most likely have Acute Viral Sinusitis.This is an infection most likely caused by a virus. There is not specific treatment for viral sinusitis other than to help you with the symptoms until the infection runs its course.  You may use an oral decongestant such as Mucinex D or if you have glaucoma or high blood pressure use plain Mucinex. Saline nasal spray help and can safely be used as often as needed for congestion Some authorities believe that zinc sprays or the use of Echinacea may shorten the course of your symptoms.  PLEASE REACH OUT TO [place] IF YOUR SYMPTOMS PERSIST AFTER NEXT THURSDAY  Sinus infections are not as easily transmitted as other respiratory infection, however we still recommend that you avoid close contact with loved ones, especially the very young and elderly.  Remember to wash your hands thoroughly throughout the day as this is the number one way to prevent the spread of infection!  Home Care: Only take medications as instructed by your medical team. Do not take these medications with alcohol. A steam or ultrasonic humidifier can help congestion.  You can place a towel over your head and breathe in the steam from hot water coming from a faucet. Avoid close contacts especially the very young and the elderly. Cover your mouth when you cough or sneeze. Always remember to wash your hands.  Get Help Right Away If: You develop worsening fever or sinus pain. You develop a severe head ache or visual changes. Your symptoms persist after you have completed your treatment plan.  Make sure you Understand these instructions. Will watch your condition. Will get help right away if you are not doing  well or get worse.   Thank you for choosing an e-visit.  Your e-visit answers were reviewed by a board certified advanced clinical practitioner to complete your personal care plan. Depending upon the condition, your plan could have included both over the counter or prescription medications.  Please review your pharmacy choice. Make sure the pharmacy is open so you can pick up prescription now. If there is a problem, you may contact your provider through [employer] and have the prescription routed to another pharmacy.  Your safety is important to [place]. If you have drug allergies check your prescription carefully.   For the next 24 hours you can use MyChart to ask questions about today's visit, request a non-urgent call back, or ask for a work or school excuse. You will get an email in the next two days asking about your experience. I hope that your e-visit has been valuable and will speed your recovery.

## 2022-08-17 NOTE — Progress Notes (Signed)
I have spent 5 minutes in review of e-visit questionnaire, review and updating patient chart, medical decision making and response to patient.  ° °Nicolina Hirt W Andrina Locken, NP ° °  °

## 2022-08-17 NOTE — Progress Notes (Signed)
I have spent 5 minutes in review of e-visit questionnaire, review and updating patient chart, medical decision making and response to patient.  ° °Kseniya Grunden W Zinnia Tindall, NP ° °  °

## 2022-08-18 ENCOUNTER — Ambulatory Visit (INDEPENDENT_AMBULATORY_CARE_PROVIDER_SITE_OTHER): Payer: Medicaid Other | Admitting: Family Medicine

## 2022-08-18 ENCOUNTER — Ambulatory Visit (INDEPENDENT_AMBULATORY_CARE_PROVIDER_SITE_OTHER): Payer: Medicaid Other

## 2022-08-18 VITALS — BP 90/68 | HR 63 | Ht 64.0 in | Wt 174.0 lb

## 2022-08-18 DIAGNOSIS — M9904 Segmental and somatic dysfunction of sacral region: Secondary | ICD-10-CM | POA: Diagnosis not present

## 2022-08-18 DIAGNOSIS — Q796 Ehlers-Danlos syndrome, unspecified: Secondary | ICD-10-CM | POA: Diagnosis not present

## 2022-08-18 DIAGNOSIS — E079 Disorder of thyroid, unspecified: Secondary | ICD-10-CM

## 2022-08-18 DIAGNOSIS — M9908 Segmental and somatic dysfunction of rib cage: Secondary | ICD-10-CM

## 2022-08-18 DIAGNOSIS — M542 Cervicalgia: Secondary | ICD-10-CM

## 2022-08-18 DIAGNOSIS — M2550 Pain in unspecified joint: Secondary | ICD-10-CM | POA: Diagnosis not present

## 2022-08-18 DIAGNOSIS — M9903 Segmental and somatic dysfunction of lumbar region: Secondary | ICD-10-CM | POA: Diagnosis not present

## 2022-08-18 DIAGNOSIS — M9902 Segmental and somatic dysfunction of thoracic region: Secondary | ICD-10-CM

## 2022-08-18 DIAGNOSIS — E049 Nontoxic goiter, unspecified: Secondary | ICD-10-CM

## 2022-08-18 DIAGNOSIS — M9901 Segmental and somatic dysfunction of cervical region: Secondary | ICD-10-CM

## 2022-08-18 MED ORDER — VENLAFAXINE HCL ER 75 MG PO CP24: 75.0000 mg | ORAL_CAPSULE | Freq: Every day | ORAL | 0 refills | Status: DC

## 2022-08-18 NOTE — Assessment & Plan Note (Signed)
Instability noted, chronic, with exacerbation  Fibromyalgia as well  Increase effexor noted.  We did a new prescription today.  Hopefully will be beneficial.  Patient will monitor the blood pressure and the heart rate.  Hopefully will help with some of the underlying depression as well.  Will follow-up again in 6 to 8 weeks

## 2022-08-18 NOTE — Patient Instructions (Addendum)
Labs today Effexor 57m Cervical xray today [place] [id number] me again in 4-6 weeks

## 2022-08-18 NOTE — Assessment & Plan Note (Signed)
Labs today secondary to thyromegaly noted

## 2022-08-19 LAB — T3, FREE: T3, Free: 3.1 pg/mL (ref 2.3–4.2)

## 2022-08-19 LAB — T4, FREE: Free T4: 0.8 ng/dL (ref 0.60–1.60)

## 2022-08-19 LAB — CORTISOL: Cortisol, Plasma: 7.4 ug/dL

## 2022-08-19 LAB — TSH: TSH: 0.51 u[IU]/mL (ref 0.35–5.50)

## 2022-08-20 MED ORDER — AMOXICILLIN-POT CLAVULANATE 875-125 MG PO TABS: 1.0000 | ORAL_TABLET | Freq: Two times a day (BID) | ORAL | 0 refills | Status: DC

## 2022-08-20 NOTE — Addendum Note (Signed)
Addended by: Brunetta Jeans on: 08/20/2022 07:06 AM   Modules accepted: Orders

## 2022-08-21 ENCOUNTER — Ambulatory Visit: Payer: Medicaid Other | Admitting: Family Medicine

## 2022-08-22 ENCOUNTER — Other Ambulatory Visit: Payer: Self-pay | Admitting: Family Medicine

## 2022-08-23 LAB — ALDOSTERONE + RENIN ACTIVITY W/ RATIO
Aldosterone: <1 ng/dL
Renin Activity: 0.56 ng/mL/h (ref 0.25–5.82)

## 2022-08-23 LAB — THYROID PEROXIDASE ANTIBODY: Thyroperoxidase Ab SerPl-aCnc: <1 IU/mL (ref <9)

## 2022-08-23 LAB — THYROID STIMULATING IMMUNOGLOBULIN: TSI: <89 % baseline (ref <140)

## 2022-08-28 ENCOUNTER — Encounter: Payer: Self-pay | Admitting: Family Medicine

## 2022-09-10 ENCOUNTER — Ambulatory Visit
Admission: RE | Admit: 2022-09-10 | Discharge: 2022-09-10 | Disposition: A | Discharge status: Home / Self Care | Payer: Medicaid Other | Source: Ambulatory Visit | Attending: Family Medicine | Admitting: Family Medicine

## 2022-09-10 DIAGNOSIS — E079 Disorder of thyroid, unspecified: Secondary | ICD-10-CM

## 2022-09-15 ENCOUNTER — Other Ambulatory Visit: Payer: Self-pay | Admitting: Family Medicine

## 2022-09-15 ENCOUNTER — Other Ambulatory Visit: Payer: Self-pay | Admitting: Nurse Practitioner

## 2022-09-15 DIAGNOSIS — J069 Acute upper respiratory infection, unspecified: Secondary | ICD-10-CM

## 2022-09-16 NOTE — Progress Notes (Deleted)
  [place] Phone: [phone] Subjective:    I'm seeing this patient by the request  of:  Pcp, No  CC:   [id number]  Angela Woodard is a 32 y.o. female coming in with complaint of back and neck pain. OMT 08/18/2022. Patient states   Medications patient has been prescribed: None  Taking:         Reviewed prior external information including notes and imaging from previsou exam, outside providers and external EMR if available.   As well as notes that were available from care everywhere and other healthcare systems.  Past medical history, social, surgical and family history all reviewed in electronic medical record.  No pertanent information unless stated regarding to the chief complaint.   Past Medical History:  Diagnosis Date   Arthritis    Celiac disease    Depression    Ehlers-Danlos, hypermobile type    Normal echo 2021   Fibromyalgia    History of kidney stones    Mast cell activation syndrome (HCC)    Migraine    Nephropathy    OCD (obsessive compulsive disorder)    POTS (postural orthostatic tachycardia syndrome)    Pruritus    Normal bile acids 06/06/2021   Seizure disorder ([place])    Last reported event 05/15/2021- during pt sleep    Allergies  Allergen Reactions   Relpax [Eletriptan] Anaphylaxis    Throat swelling   Sulfa Antibiotics Hives and Rash   Gluten Meal    Latex Hives   Lamotrigine Rash     Review of Systems:  No headache, visual changes, nausea, vomiting, diarrhea, constipation, dizziness, abdominal pain, skin rash, fevers, chills, night sweats, weight loss, swollen lymph nodes, body aches, joint swelling, chest pain, shortness of breath, mood changes. POSITIVE muscle aches  Objective  There were no vitals taken for this visit.   General: No apparent distress alert and oriented x3 mood and affect normal, dressed appropriately.  HEENT: Pupils equal, extraocular  movements intact  Respiratory: Patient's speak in full sentences and does not appear short of breath  Cardiovascular: No lower extremity edema, non tender, no erythema  Gait MSK:  Back   Osteopathic findings  C2 flexed rotated and side bent right C6 flexed rotated and side bent left T3 extended rotated and side bent right inhaled rib T9 extended rotated and side bent left L2 flexed rotated and side bent right Sacrum right on right       Assessment and Plan:  No problem-specific Assessment & Plan notes found for this encounter.    Nonallopathic problems  Decision today to treat with OMT was based on Physical Exam  After verbal consent patient was treated with HVLA, ME, FPR techniques in cervical, rib, thoracic, lumbar, and sacral  areas  Patient tolerated the procedure well with improvement in symptoms  Patient given exercises, stretches and lifestyle modifications  See medications in patient instructions if given  Patient will follow up in 4-8 weeks             Note: This dictation was prepared with Dragon dictation along with smaller phrase technology. Any transcriptional errors that result from this process are unintentional.

## 2022-09-18 ENCOUNTER — Ambulatory Visit: Payer: Medicaid Other | Admitting: Family Medicine

## 2022-09-25 ENCOUNTER — Other Ambulatory Visit: Payer: Self-pay | Admitting: Family Medicine

## 2022-10-13 NOTE — Progress Notes (Deleted)
  [name] [address] [place] [address] Phone: [phone] Subjective:    I'm seeing this patient by the request  of:  Pcp, No  CC:   [id number]  Angela Woodard is a 32 y.o. female coming in with complaint of back and neck pain. OMT on 08/18/2022. Patient states   Medications patient has been prescribed: Effexor  Taking:         Reviewed prior external information including notes and imaging from previsou exam, outside providers and external EMR if available.   As well as notes that were available from care everywhere and other healthcare systems.  Past medical history, social, surgical and family history all reviewed in electronic medical record.  No pertanent information unless stated regarding to the chief complaint.   Past Medical History:  Diagnosis Date   Arthritis    Celiac disease    Depression    Ehlers-Danlos, hypermobile type    Normal echo 2021   Fibromyalgia    History of kidney stones    Mast cell activation syndrome (HCC)    Migraine    Nephropathy    OCD (obsessive compulsive disorder)    POTS (postural orthostatic tachycardia syndrome)    Pruritus    Normal bile acids 06/06/2021   Seizure disorder (HCC)    Last reported event 05/15/2021- during pt sleep    Allergies  Allergen Reactions   Relpax [Eletriptan] Anaphylaxis    Throat swelling   Sulfa Antibiotics Hives and Rash   Gluten Meal    Latex Hives   Lamotrigine Rash     Review of Systems:  No headache, visual changes, nausea, vomiting, diarrhea, constipation, dizziness, abdominal pain, skin rash, fevers, chills, night sweats, weight loss, swollen lymph nodes, body aches, joint swelling, chest pain, shortness of breath, mood changes. POSITIVE muscle aches  Objective  There were no vitals taken for this visit.   General: No apparent distress alert and oriented x3 mood and affect normal, dressed appropriately.  HEENT: Pupils equal,  extraocular movements intact  Respiratory: Patient's speak in full sentences and does not appear short of breath  Cardiovascular: No lower extremity edema, non tender, no erythema  Gait MSK:  Back   Osteopathic findings  C2 flexed rotated and side bent right C6 flexed rotated and side bent left T3 extended rotated and side bent right inhaled rib T9 extended rotated and side bent left L2 flexed rotated and side bent right Sacrum right on right       Assessment and Plan:  No problem-specific Assessment & Plan notes found for this encounter.    Nonallopathic problems  Decision today to treat with OMT was based on Physical Exam  After verbal consent patient was treated with HVLA, ME, FPR techniques in cervical, rib, thoracic, lumbar, and sacral  areas  Patient tolerated the procedure well with improvement in symptoms  Patient given exercises, stretches and lifestyle modifications  See medications in patient instructions if given  Patient will follow up in 4-8 weeks             Note: This dictation was prepared with Dragon dictation along with smaller phrase technology. Any transcriptional errors that result from this process are unintentional.

## 2022-10-14 ENCOUNTER — Ambulatory Visit: Payer: Medicaid Other | Admitting: Family Medicine

## 2022-10-27 ENCOUNTER — Ambulatory Visit: Payer: Medicaid Other | Admitting: Family Medicine

## 2022-10-27 ENCOUNTER — Other Ambulatory Visit: Payer: Self-pay | Admitting: Family Medicine

## 2022-11-08 ENCOUNTER — Other Ambulatory Visit: Payer: Self-pay | Admitting: Family Medicine

## 2022-11-19 NOTE — Progress Notes (Unsigned)
[name] [address] [place] [address] Phone: [phone] Subjective:   Bruce Donath, am serving as a scribe for Dr. Antoine Primas.  I'm seeing this patient by the request  of:  Pcp, No  CC: Back and neck pain follow-up  [id number]  Angela Woodard is a 32 y.o. female coming in with complaint of back and neck pain. OMT 08/18/2022. Was trying to titrate down on Effexor per [employer]. Patient states that her upper back and elbows have been bothering her since last visit.   Medications patient has been prescribed: Effexor  Taking:         Reviewed prior external information including notes and imaging from previsou exam, outside providers and external EMR if available.   As well as notes that were available from care everywhere and other healthcare systems.  Past medical history, social, surgical and family history all reviewed in electronic medical record.  No pertanent information unless stated regarding to the chief complaint.   Past Medical History:  Diagnosis Date   Arthritis    Celiac disease    Depression    Ehlers-Danlos, hypermobile type    Normal echo 2021   Fibromyalgia    History of kidney stones    Mast cell activation syndrome (HCC)    Migraine    Nephropathy    OCD (obsessive compulsive disorder)    POTS (postural orthostatic tachycardia syndrome)    Pruritus    Normal bile acids 06/06/2021   Seizure disorder (HCC)    Last reported event 05/15/2021- during pt sleep    Allergies  Allergen Reactions   Relpax [Eletriptan] Anaphylaxis    Throat swelling   Sulfa Antibiotics Hives and Rash   Gluten Meal    Latex Hives   Lamotrigine Rash     Review of Systems:  No , visual changes, nausea, vomiting, diarrhea, constipation, dizziness, abdominal pain, skin rash, fevers, chills, night sweats, weight loss, swollen lymph nodes,  joint swelling, chest pain, shortness of breath, mood changes.  POSITIVE muscle aches, body aches, headache, dysuria  Objective  Blood pressure 108/82, pulse 86, height 5\' 4"  (1.626 m), weight 176 lb (79.8 kg), SpO2 98 %.   General: No apparent distress alert and oriented x3 mood and affect normal, dressed appropriately.  HEENT: Pupils equal, extraocular movements intact  Respiratory: Patient's speak in full sentences and does not appear short of breath  Cardiovascular: No lower extremity edema, non tender, no erythema  Follow-up pain seems to be left greater than right.  Does seem to be out of proportion to the amount of palpation.  Osteopathic findings  C2 flexed rotated and side bent right C6 flexed rotated and side bent left T3 extended rotated and side bent right inhaled rib T9 extended rotated and side bent left L2 flexed rotated and side bent right Sacrum right on right       Assessment and Plan:  Ehlers-Danlos syndrome Patient has difficulty with the hypermobility. Workup has been relatively regular at the moment though.  We discussed with patient to continue to work on core strengthening avoid excessive stretching.  Patient is continuing to have chronic pain and we did discuss the possibility of pain management referral to discuss potentially low-dose naltrexone.  Patient does have a past medical history significant for a seizure disorder.  Follow-up with me again in 6 weeks if osteopathic manipulation continues to help.  History of primary IgA nephropathy Referred patient to nephrologist.  History of  seizure disorder Referred to neurology.  Patient has stopped some of her medications on her own accord but continues the gabapentin.  Has not had any breakthrough.   Did discuss with patient about different medications including the gabapentin.  Has been taking naproxen very intermittently. Nonallopathic problems  Decision today to treat with OMT was based on Physical Exam  After verbal consent patient was treated with HVLA, ME, FPR  techniques in cervical, rib, thoracic, lumbar, and sacral  areas  Patient tolerated the procedure well with improvement in symptoms  Patient given exercises, stretches and lifestyle modifications  See medications in patient instructions if given  Patient will follow up in 4-8 weeks     The above documentation has been reviewed and is accurate and complete Judi Saa, DO         Note: This dictation was prepared with Dragon dictation along with smaller phrase technology. Any transcriptional errors that result from this process are unintentional.

## 2022-11-20 ENCOUNTER — Ambulatory Visit (INDEPENDENT_AMBULATORY_CARE_PROVIDER_SITE_OTHER): Payer: Medicaid Other | Admitting: Family Medicine

## 2022-11-20 ENCOUNTER — Encounter: Payer: Self-pay | Admitting: Family Medicine

## 2022-11-20 VITALS — BP 108/82 | HR 86 | Ht 64.0 in | Wt 176.0 lb

## 2022-11-20 DIAGNOSIS — J302 Other seasonal allergic rhinitis: Secondary | ICD-10-CM

## 2022-11-20 DIAGNOSIS — G63 Polyneuropathy in diseases classified elsewhere: Secondary | ICD-10-CM | POA: Diagnosis not present

## 2022-11-20 DIAGNOSIS — M9901 Segmental and somatic dysfunction of cervical region: Secondary | ICD-10-CM

## 2022-11-20 DIAGNOSIS — M797 Fibromyalgia: Secondary | ICD-10-CM

## 2022-11-20 DIAGNOSIS — M2550 Pain in unspecified joint: Secondary | ICD-10-CM

## 2022-11-20 DIAGNOSIS — M9904 Segmental and somatic dysfunction of sacral region: Secondary | ICD-10-CM

## 2022-11-20 DIAGNOSIS — Q796 Ehlers-Danlos syndrome, unspecified: Secondary | ICD-10-CM

## 2022-11-20 DIAGNOSIS — Z8669 Personal history of other diseases of the nervous system and sense organs: Secondary | ICD-10-CM

## 2022-11-20 DIAGNOSIS — Z87448 Personal history of other diseases of urinary system: Secondary | ICD-10-CM

## 2022-11-20 DIAGNOSIS — M9908 Segmental and somatic dysfunction of rib cage: Secondary | ICD-10-CM | POA: Diagnosis not present

## 2022-11-20 DIAGNOSIS — G40909 Epilepsy, unspecified, not intractable, without status epilepticus: Secondary | ICD-10-CM

## 2022-11-20 DIAGNOSIS — M9903 Segmental and somatic dysfunction of lumbar region: Secondary | ICD-10-CM | POA: Diagnosis not present

## 2022-11-20 DIAGNOSIS — D472 Monoclonal gammopathy: Secondary | ICD-10-CM

## 2022-11-20 DIAGNOSIS — M9902 Segmental and somatic dysfunction of thoracic region: Secondary | ICD-10-CM

## 2022-11-20 LAB — CBC WITH DIFFERENTIAL/PLATELET
Basophils Absolute: 0 10*3/uL (ref 0.0–0.1)
Basophils Relative: 0.5 % (ref 0.0–3.0)
Eosinophils Absolute: 0 10*3/uL (ref 0.0–0.7)
Eosinophils Relative: 0.6 % (ref 0.0–5.0)
HCT: 38.5 % (ref 36.0–46.0)
Hemoglobin: 13.1 g/dL (ref 12.0–15.0)
Lymphocytes Relative: 40.5 % (ref 12.0–46.0)
Lymphs Abs: 2.9 10*3/uL (ref 0.7–4.0)
MCHC: 34.1 g/dL (ref 30.0–36.0)
MCV: 96 fl (ref 78.0–100.0)
Monocytes Absolute: 0.5 10*3/uL (ref 0.1–1.0)
Monocytes Relative: 6.7 % (ref 3.0–12.0)
Neutro Abs: 3.7 10*3/uL (ref 1.4–7.7)
Neutrophils Relative %: 51.7 % (ref 43.0–77.0)
Platelets: 261 10*3/uL (ref 150.0–400.0)
RBC: 4.02 Mil/uL (ref 3.87–5.11)
RDW: 11.9 % (ref 11.5–15.5)
WBC: 7.1 10*3/uL (ref 4.0–10.5)

## 2022-11-20 LAB — COMPREHENSIVE METABOLIC PANEL
ALT: 12 U/L (ref 0–35)
AST: 18 U/L (ref 0–37)
Albumin: 4.2 g/dL (ref 3.5–5.2)
Alkaline Phosphatase: 50 U/L (ref 39–117)
BUN: 14 mg/dL (ref 6–23)
CO2: 27 mEq/L (ref 19–32)
Calcium: 9.4 mg/dL (ref 8.4–10.5)
Chloride: 104 mEq/L (ref 96–112)
Creatinine, Ser: 0.75 mg/dL (ref 0.40–1.20)
GFR: 106.22 mL/min (ref >60.00)
Glucose, Bld: 92 mg/dL (ref 70–99)
Potassium: 4.1 mEq/L (ref 3.5–5.1)
Sodium: 138 mEq/L (ref 135–145)
Total Bilirubin: 0.3 mg/dL (ref 0.2–1.2)
Total Protein: 7.3 g/dL (ref 6.0–8.3)

## 2022-11-20 LAB — URINALYSIS, ROUTINE W REFLEX MICROSCOPIC
Bilirubin Urine: NEGATIVE
Ketones, ur: NEGATIVE
Leukocytes,Ua: NEGATIVE
Nitrite: NEGATIVE
Specific Gravity, Urine: 1.015 (ref 1.000–1.030)
Total Protein, Urine: NEGATIVE
Urine Glucose: NEGATIVE
Urobilinogen, UA: 0.2 (ref 0.0–1.0)
pH: 7 (ref 5.0–8.0)

## 2022-11-20 LAB — T3, FREE: T3, Free: 3.7 pg/mL (ref 2.3–4.2)

## 2022-11-20 LAB — URIC ACID: Uric Acid, Serum: 5.6 mg/dL (ref 2.4–7.0)

## 2022-11-20 LAB — SEDIMENTATION RATE: Sed Rate: 15 mm/hr (ref 0–20)

## 2022-11-20 LAB — TSH: TSH: 0.64 u[IU]/mL (ref 0.35–5.50)

## 2022-11-20 LAB — T4, FREE: Free T4: 0.84 ng/dL (ref 0.60–1.60)

## 2022-11-20 NOTE — Assessment & Plan Note (Signed)
Patient has been seen by multiple different providers over the course of time.  Will review notes labs again to see if anything else is potentially contributing.

## 2022-11-20 NOTE — Assessment & Plan Note (Signed)
Referred patient to nephrologist.

## 2022-11-20 NOTE — Addendum Note (Signed)
Addended by: Ethlyn Daniels on: 11/20/2022 02:48 PM   Modules accepted: Orders

## 2022-11-20 NOTE — Assessment & Plan Note (Signed)
Patient has difficulty with the hypermobility. Workup has been relatively regular at the moment though.  We discussed with patient to continue to work on core strengthening avoid excessive stretching.  Patient is continuing to have chronic pain and we did discuss the possibility of pain management referral to discuss potentially low-dose naltrexone.  Patient does have a past medical history significant for a seizure disorder.  Follow-up with me again in 6 weeks if osteopathic manipulation continues to help.

## 2022-11-20 NOTE — Patient Instructions (Addendum)
Labs today Dr. Everlena Cooper seizure disorder Allergy referral seasonal allergies Iga neuropathy referral to nephrology  Read Vibryyd  Pain management for low dose naltrexone See me again in 2-3 months

## 2022-11-20 NOTE — Assessment & Plan Note (Signed)
Referred to neurology.  Patient has stopped some of her medications on her own accord but continues the gabapentin.  Has not had any breakthrough.

## 2022-11-21 ENCOUNTER — Other Ambulatory Visit: Payer: Self-pay

## 2022-11-21 DIAGNOSIS — N281 Cyst of kidney, acquired: Secondary | ICD-10-CM

## 2022-11-21 NOTE — Progress Notes (Signed)
US ordered

## 2022-11-22 LAB — PTH, INTACT AND CALCIUM
Calcium: 9.4 mg/dL (ref 8.6–10.2)
PTH: 38 pg/mL (ref 16–77)

## 2022-11-23 ENCOUNTER — Encounter: Payer: Self-pay | Admitting: Family Medicine

## 2022-11-24 ENCOUNTER — Other Ambulatory Visit: Payer: Self-pay | Admitting: Family Medicine

## 2022-11-24 DIAGNOSIS — Q796 Ehlers-Danlos syndrome, unspecified: Secondary | ICD-10-CM

## 2022-11-24 DIAGNOSIS — M2550 Pain in unspecified joint: Secondary | ICD-10-CM

## 2022-11-26 ENCOUNTER — Ambulatory Visit
Admission: RE | Admit: 2022-11-26 | Discharge: 2022-11-26 | Disposition: A | Discharge status: Home / Self Care | Payer: Medicaid Other | Source: Ambulatory Visit | Attending: Family Medicine | Admitting: Family Medicine

## 2022-11-26 ENCOUNTER — Encounter: Payer: Self-pay | Admitting: Physical Medicine & Rehabilitation

## 2022-11-26 ENCOUNTER — Other Ambulatory Visit: Payer: Self-pay

## 2022-11-26 DIAGNOSIS — N281 Cyst of kidney, acquired: Secondary | ICD-10-CM

## 2022-11-26 DIAGNOSIS — N2889 Other specified disorders of kidney and ureter: Secondary | ICD-10-CM

## 2022-11-27 ENCOUNTER — Ambulatory Visit: Payer: Medicaid Other | Admitting: Family Medicine

## 2022-11-28 ENCOUNTER — Encounter: Payer: Self-pay | Admitting: Neurology

## 2022-11-28 ENCOUNTER — Telehealth: Payer: Self-pay

## 2022-11-28 ENCOUNTER — Other Ambulatory Visit: Payer: Self-pay

## 2022-11-28 NOTE — Telephone Encounter (Signed)
Received a fax that patient needs to have abdomen pelvis x rays before they will potentially approve the MRI's

## 2022-11-29 ENCOUNTER — Other Ambulatory Visit: Payer: Self-pay | Admitting: Family Medicine

## 2022-12-02 ENCOUNTER — Other Ambulatory Visit: Payer: Self-pay

## 2022-12-02 DIAGNOSIS — R102 Pelvic and perineal pain: Secondary | ICD-10-CM

## 2022-12-02 DIAGNOSIS — R109 Unspecified abdominal pain: Secondary | ICD-10-CM

## 2022-12-02 NOTE — Telephone Encounter (Signed)
Xray orders placed. Patient notified.

## 2022-12-02 NOTE — Telephone Encounter (Signed)
Can you let me know once this person has gotten her x rays done please

## 2022-12-04 ENCOUNTER — Other Ambulatory Visit: Payer: Medicaid Other

## 2022-12-04 ENCOUNTER — Ambulatory Visit (INDEPENDENT_AMBULATORY_CARE_PROVIDER_SITE_OTHER): Payer: Medicaid Other

## 2022-12-04 DIAGNOSIS — R109 Unspecified abdominal pain: Secondary | ICD-10-CM

## 2022-12-04 DIAGNOSIS — R102 Pelvic and perineal pain: Secondary | ICD-10-CM

## 2022-12-08 ENCOUNTER — Telehealth: Payer: Self-pay

## 2022-12-08 NOTE — Telephone Encounter (Signed)
Received a call from insurance stating pelvis imaging is denied because clinicals show that the appropriate imaging would be abdomen which is already approved. if after we get the abdomen results we still need pelvis we can upload that imaging with further clinicals proving why we need the pelvis imaging.

## 2022-12-08 NOTE — Telephone Encounter (Signed)
error 

## 2022-12-14 ENCOUNTER — Other Ambulatory Visit: Payer: Self-pay | Admitting: Family Medicine

## 2022-12-23 ENCOUNTER — Other Ambulatory Visit: Payer: Medicaid Other

## 2022-12-29 ENCOUNTER — Ambulatory Visit (INDEPENDENT_AMBULATORY_CARE_PROVIDER_SITE_OTHER): Payer: Medicaid Other | Admitting: Neurology

## 2022-12-29 ENCOUNTER — Encounter: Payer: Self-pay | Admitting: Neurology

## 2022-12-29 VITALS — BP 115/67 | HR 72 | Ht 64.0 in | Wt 172.4 lb

## 2022-12-29 DIAGNOSIS — G40909 Epilepsy, unspecified, not intractable, without status epilepticus: Secondary | ICD-10-CM | POA: Diagnosis not present

## 2022-12-29 NOTE — Patient Instructions (Signed)
Good to meet you.  Schedule MRI brain with and without contrast  2. Schedule 1-hour EEG  3. Continue all your medications  4. Follow-up in 4 months, call for any changes   Seizure Precautions: 1. If medication has been prescribed for you to prevent seizures, take it exactly as directed.  Do not stop taking the medicine without talking to your doctor first, even if you have not had a seizure in a long time.   2. Avoid activities in which a seizure would cause danger to yourself or to others.  Don't operate dangerous machinery, swim alone, or climb in high or dangerous places, such as on ladders, roofs, or girders.  Do not drive unless your doctor says you may.  3. If you have any warning that you may have a seizure, lay down in a safe place where you can't hurt yourself.    4.  No driving for 6 months from last seizure, as per [facility].   Please refer to the following link on the [employer]ile[employer] of [employer]'s website for more information: http://www.epilepsyfoundation.org/answerplace/Social/driving/drivingu.cfm   5.  Maintain good sleep hygiene. Avoid alcohol.  6.  Notify your neurology if you are planning pregnancy or if you become pregnant.  7.  Contact your doctor if you have any problems that may be related to the medicine you are taking.  8.  Call 911 and bring the patient back to the ED if:        A.  The seizure lasts longer than 5 minutes.       B.  The patient doesn't awaken shortly after the seizure  C.  The patient has new problems such as difficulty seeing, speaking or moving  D.  The patient was injured during the seizure  E.  The patient has a temperature over 102 F (39C)  F.  The patient vomited and now is having trouble breathing

## 2022-12-29 NOTE — Progress Notes (Signed)
NEUROLOGY CONSULTATION NOTE  Angela Woodard MRN: [id number] DOB: [birth year]  Referring provider: Dr. Antoine Primas Primary care provider: Dr. Maia Petties  Reason for consult:  establish care for seizures  Dear Dr Katrinka Blazing:  Thank you for your kind referral of Angela Woodard for consultation of the above symptoms. Although her history is well known to you, please allow me to reiterate it for the purpose of our medical record. The patient was accompanied to the clinic by her mother-in-law Agustin Cree who also provides collateral information. Records and images were personally reviewed where available.   HISTORY OF PRESENT ILLNESS: This is a 32 year old right-handed woman with a history of Ehlers-Danlos syndrome, IgA nephropathy, Mast Cell Activation, POTS, chronic pain, fibromyalgia, ADHD, anxiety/depression/PTSD, presenting to establish care for seizures. Records from her prior neurologist at [facility] [place], Dr. Verdie Mosher, were reviewed. She started having seizures at age 54. She would have rare generalized convulsions that increased in frequency over the years. She would have periods where she would stare/become unresponsive, sometimes occurring a couple of times a day. Her last visit at [facility] was in 10/2021. At that time, she was on Gabapentin for pain. She has had EEGs and brain imaging in the past reportedly normal. She was admitted to [facility] from May 13-18, 2023 where EEG showed bilateral temporal slowing that improved as medications were weaned, no clear discharges. She had an episode of "brain soup" and and episode of staring with no EEG changes seen.  Per notes, her husband shared a video that was also atypical and seemed to have non-epileptic elements. She was diagnosed with Co-existing epilepsy and Functional Neurological disorder. She was discharged on Lacosamide 150mg  BID and Gabapentin 400mg  TID. She reports feeling unheard when she was in the EMU.   She describes an aura of  overwhelming anxiety with heart palpitations, feeling off, followed by full body convulsions. She has staring type events with unresponsiveness for 2 minutes. In 10/2021, she was having convulsions every 2-3 months and staring spells every month. She had tried Levetiracetam (fatigue, ineffective), Lamotrigine (rash), and Lacosamide, which per notes improved the seizures but caused brain fog, memory lapses, and fatigue. Lacosamide increased to 150mg  BID. She states that she weaned off the Lacosamide over 2 months ago, and the brain fog, fatigue, feeling like brain was being melted, improved off medication. She was having blurrred vision and more tremors than usual, which also got better. She is only on Gabapentin 400mg  TID without side effects. She reports the last GTC was over a year ago. They tend to be around night, usually her brain feels "squeaky," she used to get really angry, then would not recall events. She occasionally tastes blood (tasting it during the visit), then may lose consciousness. Her last staring/unresponsive episode was over 6 months ago. She has less of an aura with them, she just does not feel right. She thinks these started in childhood, her mom recalls at age 55 playing soccer she stopped with fists clenched, staring. They thought she was angry and "put me in therapy." Her mother has seen one episode of staring where she was out of it. She is very exhausted after them.   She states that her body is "a mess just in general." She has had migraines and cluster headaches since childhood, with pain behind the eyes and in the back of her head. She has stabbing pains on the sides. There is nausea/vomiting with the migraines. Recently she has been having a migraine  once a week that she attributes to "stress and other things." She takes prn Excedrin which helps. She has prn midodrine for hypotension. She reports being in pain "24/7." Spine pain is the most debilitating on a daily basis. She has an  MRI tomorrow for a kidney mass. She seen Pain Management on Thursday. She gets 4-5 hours of sleep and is always fatigued. Mood is better since restarting ADHD medication, also helping with her anxiety. Her therapist told her "you're too much for me." She states "I'm in so much pain it leads to depression." She has muscle spasms that are more with Effexor. She lives with her husband and 4 children ages 53, 62, 2, and 1. No pregnancy plans, they use the barrier method for contraception. She drinks alcohol socially. She does not drive.   Epilepsy Risk Factors:  A paternal cousin has seizures. She had a normal birth and early development.  There is no history of febrile convulsions, CNS infections such as meningitis/encephalitis, significant traumatic brain injury, neurosurgical procedures.  Prior AEDs: Levetiracetam, Lamotrigine, Lacosamide  Diagnostic Data: EEGs: Per notes, ambulatory EEG in 2017 with [facility] normal EMU admission at [facility] May 13-18, 2023: EEG showed bilateral temporal slowing that improved as medications were weaned, no clear discharges. She had an episode of "brain soup" and and episode of staring with no EEG changes seen. MRI: MRI brain without contrast at [facility] 05/2021 normal   PAST MEDICAL HISTORY: Past Medical History:  Diagnosis Date   Arthritis    Celiac disease    Depression    Ehlers-Danlos, hypermobile type    Normal echo 2021   Fibromyalgia    History of kidney stones    Mast cell activation syndrome (HCC)    Migraine    Nephropathy    OCD (obsessive compulsive disorder)    POTS (postural orthostatic tachycardia syndrome)    Pruritus    Normal bile acids 06/06/2021   Seizure disorder (HCC)    Last reported event 05/15/2021- during pt sleep    PAST SURGICAL HISTORY: Past Surgical History:  Procedure Laterality Date   TYMPANOSTOMY     x3    MEDICATIONS: Current Outpatient Medications on File Prior to Visit  Medication Sig Dispense Refill    amphetamine-dextroamphetamine (ADDERALL) 20 MG tablet Take 20 mg by mouth 2 (two) times daily. 20mg  in the morning and 10mg  at night     cetirizine (ZYRTEC) 10 MG tablet Take 10 mg by mouth daily.     fluticasone (FLONASE) 50 MCG/ACT nasal spray Place 2 sprays into both nostrils daily. 16 g 0   gabapentin (NEURONTIN) 400 MG capsule TAKE 1 CAPSULE BY MOUTH 3 TIMES DAILY. 270 capsule 1   montelukast (SINGULAIR) 10 MG tablet TAKE 1 TABLET BY MOUTH EVERYDAY AT BEDTIME 90 tablet 0   naproxen (NAPROSYN) 500 MG tablet Take 1 tablet (500 mg total) by mouth 2 (two) times daily with a meal. 30 tablet 0   propranolol (INDERAL) 10 MG tablet Take 10 mg by mouth 3 (three) times daily.     UNABLE TO FIND Take 2 capsules by mouth daily. Med Name: Zyflamend, 2 capsules once daily per patient     No current facility-administered medications on file prior to visit.    ALLERGIES: Allergies  Allergen Reactions   Relpax [Eletriptan] Anaphylaxis    Throat swelling   Sulfa Antibiotics Hives and Rash   Gluten Meal    Latex Hives   Lamotrigine Rash    FAMILY HISTORY: Family History  Problem Relation Age of Onset   Throat cancer Father    Melanoma Father    Hypertension Mother    Heart disease Paternal Grandfather    Lung cancer Paternal Grandfather    Obesity Paternal Grandfather    Diabetes Paternal Grandfather    Memory loss Maternal Grandmother    Colon cancer Maternal Grandmother    Anxiety disorder Sister    Seizures Neg Hx     SOCIAL HISTORY: Social History   Socioeconomic History   Marital status: Married    Spouse name: Psychologist, clinical   Number of children: 1   Years of education: some colle   Highest education level: Not on file  Occupational History   Occupation: Engineer, water at [employer]  Tobacco Use   Smoking status: Never   Smokeless tobacco: Never  Vaping Use   Vaping Use: Former  Substance and Sexual Activity   Alcohol use: Yes   Drug use: No   Sexual  activity: Yes    Birth control/protection: Condom  Other Topics Concern   Not on file  Social History Narrative   Lives with husband and 2 adopted special needs children and 40 month old   Caffeine use: 1 serving daily   Right handed    Social Determinants of Health   Financial Resource Strain: Not on file  Food Insecurity: Not on file  Transportation Needs: Not on file  Physical Activity: Not on file  Stress: Not on file  Social Connections: Not on file  Intimate Partner Violence: Not on file     PHYSICAL EXAM: Vitals:   12/29/22 1030  BP: 115/67  Pulse: 72  SpO2: 98%   General: No acute distress Head:  Normocephalic/atraumatic Skin/Extremities: No rash, no edema Neurological Exam: Mental status: alert and oriented to person, place, and time, no dysarthria or aphasia, Fund of knowledge is appropriate.  Recent and remote memory are intact, 3/3 delayed recall.  Attention and concentration are normal, 5/5 WORLD backwards. Cranial nerves: CN I: not tested CN II: pupils equal, round, visual fields intact CN III, IV, VI:  full range of motion, no nystagmus, no ptosis CN V: decreased cold on left V2, right V1, intact pin CN VII: upper and lower face symmetric CN VIII: hearing intact to conversation Bulk & Tone: normal, no fasciculations. Motor: 5/5 throughout with no pronator drift. Sensation: intact to light touch, cold, pin on both UE, decreased cold on left LE, intact pin and vibration sense. Romberg test negative Deep Tendon Reflexes: +2 throughout Cerebellar: no incoordination on finger to nose testing Gait: narrow-based and steady, able to tandem walk adequately. Tremor: none in office today   IMPRESSION: This is a 32 year old right-handed woman with a history of Ehlers-Danlos syndrome, IgA nephropathy, Mast Cell Activation, POTS, chronic pain, fibromyalgia, ADHD, anxiety/depression/PTSD, presenting to establish care for seizures. She has a history of convulsions,  staring/unresponsive episodes. She was diagnosed with co-existing epilepsy and functional neurological disorder on May 2023 EMU admission where she had an episode of "brain soup" and another of staring with no EEG changes seen. She reports the "brain soup" symptoms resolved with stopping Lacosamide. She notes her last GTC was over a year ago, last staring/unresponsive episode was over 6 months ago. We discussed repeating MRI brain with and without contrast and 1-hour EEG for seizure classification. Continue Gabapentin 400mg  TID for pain and seizure prophylaxis. Proceed with Pain Management appointment. [place] driving laws were discussed with the patient, and she knows to stop driving  after a seizure, until 6 months seizure-free. Follow-up in 4 months, call for any changes.     Thank you for allowing me to participate in the care of this patient. Please do not hesitate to call for any questions or concerns.   Patrcia Dolly, M.D.  CC: Dr. Katrinka Blazing, Dr. Rosanne Sack

## 2022-12-30 ENCOUNTER — Ambulatory Visit
Admission: RE | Admit: 2022-12-30 | Discharge: 2022-12-30 | Disposition: A | Discharge status: Home / Self Care | Payer: Medicaid Other | Source: Ambulatory Visit | Attending: Family Medicine | Admitting: Family Medicine

## 2022-12-30 DIAGNOSIS — N2889 Other specified disorders of kidney and ureter: Secondary | ICD-10-CM

## 2022-12-30 MED ORDER — GADOPICLENOL 0.5 MMOL/ML IV SOLN
8.0000 mL | Freq: Once | INTRAVENOUS | Status: AC | PRN
  Administered 2022-12-30: 8 mL via INTRAVENOUS

## 2023-01-01 ENCOUNTER — Encounter: Payer: Self-pay | Admitting: Physical Medicine & Rehabilitation

## 2023-01-01 ENCOUNTER — Encounter: Payer: Medicaid Other | Attending: Physical Medicine & Rehabilitation | Admitting: Physical Medicine & Rehabilitation

## 2023-01-01 VITALS — BP 114/77 | HR 65 | Ht 64.0 in | Wt 171.0 lb

## 2023-01-01 DIAGNOSIS — M797 Fibromyalgia: Secondary | ICD-10-CM | POA: Diagnosis not present

## 2023-01-01 DIAGNOSIS — F32A Depression, unspecified: Secondary | ICD-10-CM | POA: Diagnosis not present

## 2023-01-01 DIAGNOSIS — Q796 Ehlers-Danlos syndrome, unspecified: Secondary | ICD-10-CM | POA: Insufficient documentation

## 2023-01-01 MED ORDER — CYCLOBENZAPRINE HCL 5 MG PO TABS: 5.0000 mg | ORAL_TABLET | Freq: Every evening | ORAL | 3 refills | Status: DC | PRN

## 2023-01-01 NOTE — Progress Notes (Addendum)
 Subjective:     Patient ID: Angela Woodard, female   DOB: Mar 14, 1991, 32 y.o.   MRN: [id number]  HPI  HPI  Angela Woodard is a 32 y.o. year old female  who  has a past medical history of Arthritis, Celiac disease, Depression, Ehlers-Danlos, hypermobile type, Fibromyalgia, History of kidney stones, Mast cell activation syndrome (HCC), Migraine, Nephropathy, OCD (obsessive compulsive disorder), POTS (postural orthostatic tachycardia syndrome), Pruritus, and Seizure disorder (HCC).   They are presenting to PM&R clinic as a new patient for pain management evaluation. They were referred treatment of pain due to EDS and fibromyalgia.  Patient reports that she has had pain all her life.  The pain is the greatest in her periscapular muscles and lower back.  Patient says she was diagnosed with fibromyalgia at 32 years old.  She was later diagnosed with EDS about 3 years ago.  She has frequent issues with her joints and often dislocates her hips and shoulders and other joints.  She also has a history of POTS, epilepsy, ADHD, OCD.  Feet will often tingle and sometimes will go numb.  She would like to avoid opioid medications.  Patient is followed by sports medicine Dr. Antoine Primas.  She has been getting osteopathic manipulation which has been helping her pain.  Patient is concerned about recurrent renal mass that is being evaluated.  She has a history of IgA nephropathy.   Red flag symptoms: No red flags for back pain endorsed in Hx or ROS  Medications tried: Nsaids- Aleve helps slightly Tylenol - Helps slightly Opiates  - Denies  Tramadol, vicodin- doesn't like, would like to avoid  Gabapentin - takes for seizures and pain   Lyrica- has not tried  TCAs  - Denies  SNRIs - Caused depression , cymbalta and effexor  Non-THC based CBD- helps    Other treatments: PT/OT  - Aquatic PT was helpful in the past  TENs unit - has been  Injections- denies  Surgery- no surgeries    Prior UDS  results: No results found for: "LABOPIA", "COCAINSCRNUR", "LABBENZ", "AMPHETMU", "THCU", "LABBARB"      Pain Inventory Average Pain 9 Pain Right Now 8 My pain is constant, sharp, burning, dull, stabbing, tingling, and aching  In the last 24 hours, has pain interfered with the following? General activity 10 Relation with others 8 Enjoyment of life 9 What TIME of day is your pain at its worst? morning  Sleep (in general) Poor  Pain is worse with: walking, bending, sitting, inactivity, standing, and some activites Pain improves with: medication Relief from Meds:  5%  walk without assistance use a cane how many minutes can you walk? 15 ability to climb steps?  yes do you drive?  no Do you have any goals in this area?  yes  not employed: date last employed not since 2023 I need assistance with the following:  household duties Do you have any goals in this area?  yes  weakness numbness tremor tingling trouble walking spasms dizziness confusion anxiety loss of taste or smell  Any changes since last visit?  yes x-rays CT/MRI Ultrasound All in [facility]  Any changes since last visit?  no    Family History  Problem Relation Age of Onset   Throat cancer Father    Melanoma Father    Hypertension Mother    Heart disease Paternal Grandfather    Lung cancer Paternal Grandfather    Obesity Paternal Grandfather    Diabetes Paternal  Grandfather    Memory loss Maternal Grandmother    Colon cancer Maternal Grandmother    Anxiety disorder Sister    Seizures Neg Hx    Social History   Socioeconomic History   Marital status: Married    Spouse name: Psychologist, clinical   Number of children: 1   Years of education: some colle   Highest education level: Not on file  Occupational History   Occupation: Engineer, water at [employer]  Tobacco Use   Smoking status: Never   Smokeless tobacco: Never  Vaping Use   Vaping Use: Former   Substances: CBD   Substance and Sexual Activity   Alcohol use: Yes   Drug use: No   Sexual activity: Yes    Birth control/protection: Condom  Other Topics Concern   Not on file  Social History Narrative   Lives with husband and 2 adopted special needs children and 37 month old   Caffeine use: 1 serving daily   Right handed    Social Determinants of Health   Financial Resource Strain: Not on file  Food Insecurity: Not on file  Transportation Needs: Not on file  Physical Activity: Not on file  Stress: Not on file  Social Connections: Not on file   Past Surgical History:  Procedure Laterality Date   TYMPANOSTOMY     x3   Past Medical History:  Diagnosis Date   Arthritis    Celiac disease    Depression    Ehlers-Danlos, hypermobile type    Normal echo 2021   Fibromyalgia    History of kidney stones    Mast cell activation syndrome (HCC)    Migraine    Nephropathy    OCD (obsessive compulsive disorder)    POTS (postural orthostatic tachycardia syndrome)    Pruritus    Normal bile acids 06/06/2021   Seizure disorder (HCC)    Last reported event 05/15/2021- during pt sleep   BP 114/77   Pulse 65   Ht 5\' 4"  (1.626 m)   Wt 171 lb (77.6 kg)   LMP 11/05/2022 (Approximate)   SpO2 98%   BMI 29.35 kg/m   Opioid Risk Score:   Fall Risk Score:  `1  Depression screen PHQ 2/9      No data to display          Review of Systems  Musculoskeletal:  Positive for arthralgias, back pain, gait problem and myalgias.       Pain all over the body  Neurological:  Positive for dizziness, tremors, weakness, numbness and headaches.       Tingling, spasms  Psychiatric/Behavioral:  Positive for confusion.        Anxiety  All other systems reviewed and are negative.      Objective:   Physical Exam  Gen: no distress, normal appearing HEENT: oral mucosa pink and moist, NCAT Cardio: Reg rate Chest: normal effort, normal rate of breathing Abd: soft, non-distended Ext: no edema Psych: Very  pleasant, normal affect, jovial Skin: intact Neuro: Alert and oriented x 4, follows commands, cranial nerves II through XII intact grossly, normal speech and language RUE: 5/5 Deltoid, 5/5 Biceps, 5/5 Triceps, 5/5 Wrist Ext, 5/5 Grip LUE: 5/5 Deltoid, 5/5 Biceps, 5/5 Triceps, 5/5 Wrist Ext, 5/5 Grip RLE: HF 5/5, KE 5/5, KF 5/5, ADF 5/5, APF 5/5 LLE: HF 5/5, KE 5/5, HF, 5/5, ADF 5/5, APF 5/5  Sensory exam normal for light touch and pain in all 4 limbs.  She does report some  altered sensation in her bilateral fifth toes.  No limb ataxia or cerebellar  signs. No abnormal tone appreciated.    Musculoskeletal: Hypermobility noted and she is able to bend thumb to wrist, hyperextend knees and elbows bilaterally Diffuse tenderness throughout bilateral upper and lower extremities Paraspinal tenderness at L-spine and C-spine Tenderness noted bilateral SI joints-greatest area of tenderness today Periscapular muscle tenderness with trigger points noted Slump test bilaterally she reported pain shooting into her thighs Spurling's negative No swollen or inflamed joints noted    Assessment:      BAYLEI SIEBELS is a 32 y.o. year old female  who  has a past medical history of Arthritis, Celiac disease, Depression, Ehlers-Danlos, hypermobile type, Fibromyalgia, History of kidney stones, Mast cell activation syndrome (HCC), Migraine, Nephropathy, OCD (obsessive compulsive disorder), POTS (postural orthostatic tachycardia syndrome), Pruritus, and Seizure disorder (HCC).   They are presenting to PM&R clinic as a new patient for pain management evaluation. They were referred treatment of pain due to EDS and fibromyalgia.  1) Ehlers-danlos syndrome.  Patient does have hypermobility on exam. -Patient did have significant tenderness at SI joints, continue to monitor this area for now 2) Fibromyalgia 3) Depression.  Denies SI or HI    Plan:      1). Order Flexeril 5 mg at bedtime 2) She is interested in  low-dose naltrexone.  Start with 1 mg daily for 2 weeks, if tolerating for 2 weeks can increase to 2 mg daily.  This was called into pharmacy 3) I think she would benefit from a consult to PT for aquatic therapy.  She reports that this has helped past. Ordered 4) consider trigger point injections, shockwave therapy at later visit 5) order Zynax nexwave 6) Discussed foods for pain, provided list 7) Could consider Lyrica however she is using gabapentin for seizure control also so would need to discuss with neurology 8) Results poor results with SNRIs in the past, TCA could be considered 9) Gradual progressive exercise    03/19/23 called pt about refill request naltrexone, no answer, left discrete VM. [facility] added 1 additional refill of naltrexone 1mg  in meantime  Addendum- called pt back- she has been out of naltrexone but has just found out she is pregnant. Discussed options with her and we decided we will hold off on medication for now.   10/16/23 Received refill request for flexeril, called pt to discuss, no answer, left discrete VM

## 2023-01-02 ENCOUNTER — Encounter: Payer: Self-pay | Admitting: Family Medicine

## 2023-01-05 ENCOUNTER — Ambulatory Visit (INDEPENDENT_AMBULATORY_CARE_PROVIDER_SITE_OTHER): Payer: Medicaid Other | Admitting: Neurology

## 2023-01-05 DIAGNOSIS — G40909 Epilepsy, unspecified, not intractable, without status epilepticus: Secondary | ICD-10-CM

## 2023-01-06 NOTE — Progress Notes (Signed)
EEG complete - results pending 

## 2023-01-12 ENCOUNTER — Ambulatory Visit: Payer: Medicaid Other | Admitting: Podiatry

## 2023-01-12 NOTE — Procedures (Signed)
ELECTROENCEPHALOGRAM REPORT  Date of Study: 01/05/2023  Patient's Name: Angela Woodard MRN: [id number] Date of Birth: 03-22-1991  Referring Provider: Dr. Patrcia Dolly  Clinical History: This is a 32 year old woman with recurrent episodes of convulsions and staring. EEG for classification.  Medications: Gabapentin, midodrine,Flexeril, Adderall, Inderal  Technical Summary: A multichannel digital 1-hour EEG recording measured by the international 10-20 system with electrodes applied with paste and impedances below 5000 ohms performed in our laboratory with EKG monitoring in an awake and asleep patient.  Hyperventilation and photic stimulation were performed.  The digital EEG was referentially recorded, reformatted, and digitally filtered in a variety of bipolar and referential montages for optimal display.    Description: The patient is awake and asleep during the recording.  During maximal wakefulness, there is a symmetric, medium voltage 10 Hz posterior dominant rhythm that attenuates with eye opening.  The record is symmetric.  During drowsiness and sleep, there is an increase in theta slowing of the background.  Vertex waves and symmetric sleep spindles were seen. Hyperventilation and photic stimulation did not elicit any abnormalities.  There were no epileptiform discharges or electrographic seizures seen.    EKG lead was unremarkable.  Impression: This 1-hour awake and asleep EEG is normal.    Clinical Correlation: A normal EEG does not exclude a clinical diagnosis of epilepsy.  If further clinical questions remain, prolonged EEG may be helpful.  Clinical correlation is advised.   Patrcia Dolly, M.D.

## 2023-01-13 ENCOUNTER — Telehealth: Payer: Self-pay

## 2023-01-13 NOTE — Telephone Encounter (Signed)
Patient's spouse, Gregary Signs, called in - DOB verified - stated patient is reacting badly without taking antihistamines that they want to let out office know she has to take medications for relief.  Patient/Spouse advised no skin testing will be done but provider will discuss other options with them at her visit.  Patient/Spouse verbalized understanding to all, no further questions.

## 2023-01-15 ENCOUNTER — Ambulatory Visit (INDEPENDENT_AMBULATORY_CARE_PROVIDER_SITE_OTHER): Payer: Medicaid Other | Admitting: Allergy

## 2023-01-15 ENCOUNTER — Other Ambulatory Visit: Payer: Self-pay

## 2023-01-15 ENCOUNTER — Encounter: Payer: Self-pay | Admitting: Allergy

## 2023-01-15 VITALS — BP 110/60 | HR 71 | Temp 98.3°F | Ht 64.5 in | Wt 170.0 lb

## 2023-01-15 DIAGNOSIS — D8940 Mast cell activation, unspecified: Secondary | ICD-10-CM | POA: Diagnosis not present

## 2023-01-15 MED ORDER — CROMOLYN SODIUM 100 MG/5ML PO CONC: 100.0000 mg | Freq: Three times a day (TID) | ORAL | 5 refills | Status: DC

## 2023-01-15 MED ORDER — ALBUTEROL SULFATE HFA 108 (90 BASE) MCG/ACT IN AERS: 2.0000 | INHALATION_SPRAY | Freq: Four times a day (QID) | RESPIRATORY_TRACT | 2 refills | Status: DC | PRN

## 2023-01-15 MED ORDER — EPINEPHRINE 0.3 MG/0.3ML IJ SOAJ: 0.3000 mg | INTRAMUSCULAR | 2 refills | Status: AC | PRN

## 2023-01-15 NOTE — Patient Instructions (Addendum)
MCAS -Constellation of symptoms can be consistent with mast cell activation syndrome -Will need to do more formal workup at this time to evaluate for frank mastocytosis -Will obtain lab work for mastocytosis including tryptase level, alpha gal panel, environmental allergy panel, basic labs, urinary studies.  Further testing will be based on these results -For management of MCAS recommend you continue your current Zyrtec 3 times a day, Pepcid 3 times a day and Singulair daily regimen for your primary antihistamine, secondary antihistamine, antileukotriene respectively. Recommend starting cromolyn taken prior to meals that can help with gut symptom control.  Cromolyn is a mast cell stabilizer. -Have access to albuterol inhaler for relief of symptoms of having wheezing, cough, chest tightness or shortness of breath. -Have access to an epinephrine device in case of allergic reaction. Will provide with emergency action plan to follow in case of allergic reaction.  Follow-up pending lab work

## 2023-01-15 NOTE — Progress Notes (Signed)
New Patient Note  RE: Angela Woodard MRN: [id number] DOB: 05/14/1991 Date of Office Visit: 01/15/2023  Primary care provider: Lamont Dowdy, DO  Chief Complaint: MCAS  History of present illness: Angela Woodard is a 32 y.o. female presenting today for evaluation of MCAS.  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   She has been diagnosed with Ehlers-Danlos syndrome with hypermobility.  She was diagnosed by rheumatologist who then referred her to cardiology.  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 She has been following with cardiology for POTS and has midodrine to use with symptoms. 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 She has history of seizure disorder. She has fibromyalgia diagnosed at 32yo.  She states when she was pregnant with her1st biological child her high-risk maternity team diagnosed her with MCAS.  She states she has had chronic health conditions her entire life. She states when she was younger there was suspicion that she has lupus but she had negative ANAs mostly (only 1 was positive).    She reports symptoms she has had can include itching (was worse in pregnancy), flushing of the cheeks (like a malar looking rash), eczema like rash on hands (that can occur in bouts for a year and then disappear for a time and it is itchy and painful), nausea, diarrhea, vomiting, sometimes constipation, bad abdominal pain, rapid heartrate/palpitations, low BP to the point of passing out (she states at times was thought she was having seizures but it was low BP), wheezing (worse lately with laying down), headaches (entire life), brain fog/memory problems, dizziness, tingling/numb sensations, sleep issues, hives.   She feels that foods can be triggers and at one time in the past she was limited in what she could eat and had lost about 40lbs.  She feels she may have some degree of symptoms as above once a day.  She does feel that alcohol  can be a trigger.  She has an expired epipen.   She states she has had times where it feels like her throat is closing for no reason.  She also states with allergies she can have red eyes, nasal congestion/drainage, throat itch.  Zyrtec helps some.    Currently taking zyrtec 3 times a day, pepcid 3 times a day and singulair nightly.  She uses flonase and astepro when needed for congestion or drainage.   She is also taking a supplement for histamine.   Review of systems: 10 pt ROS of symptoms negative unless noted in HPI  Past medical history: Past Medical History:  Diagnosis Date   Arthritis    Asthma    Celiac disease    Depression    Ehlers-Danlos, hypermobile type    Normal echo 2021   Fibromyalgia    History of kidney stones    Mast cell activation syndrome (HCC)    Migraine    Nephropathy    OCD (obsessive compulsive disorder)    POTS (postural orthostatic tachycardia syndrome)    Pruritus    Normal bile acids 06/06/2021   Seizure disorder (HCC)    Last reported event 05/15/2021- during pt sleep   Urticaria     Past surgical history: Past Surgical History:  Procedure Laterality Date   ADENOIDECTOMY     TYMPANOSTOMY     x3   TYMPANOSTOMY TUBE PLACEMENT      Family history:  Family History  Problem Relation Age of Onset   Allergic rhinitis Mother  Hypertension Mother    Allergic rhinitis Father    Throat cancer Father    Melanoma Father    Urticaria Sister    Allergic rhinitis Sister    Anxiety disorder Sister    Memory loss Maternal Grandmother    Colon cancer Maternal Grandmother    Heart disease Paternal Grandfather    Lung cancer Paternal Grandfather    Obesity Paternal Grandfather    Diabetes Paternal Grandfather    Seizures Neg Hx     Social history: Lives in a home with carpeting with gas heating and central cooling.  Cat and ferrets in the home.  There is concern for water damage or mildew in the home.  Home was recently treated for roaches.   She is a Special educational needs teacher.  Denies a smoking history.  Medication List: Current Outpatient Medications  Medication Sig Dispense Refill   albuterol (VENTOLIN HFA) 108 (90 Base) MCG/ACT inhaler Inhale 2 puffs into the lungs every 6 (six) hours as needed for wheezing or shortness of breath. 8 g 2   amphetamine-dextroamphetamine (ADDERALL) 20 MG tablet Take 20 mg by mouth 2 (two) times daily. 20mg  in the morning and 10mg  at night     cetirizine (ZYRTEC) 10 MG tablet Take 10 mg by mouth daily.     cromolyn (GASTROCROM) 100 MG/5ML solution Take 5 mLs (100 mg total) by mouth 3 (three) times daily before meals. 480 mL 5   cyclobenzaprine (FLEXERIL) 5 MG tablet Take 1 tablet (5 mg total) by mouth at bedtime as needed for muscle spasms. 30 tablet 3   EPINEPHrine 0.3 mg/0.3 mL IJ SOAJ injection Inject 0.3 mg into the muscle as needed for anaphylaxis. 1 each 2   fluticasone (FLONASE) 50 MCG/ACT nasal spray Place 2 sprays into both nostrils daily. 16 g 0   gabapentin (NEURONTIN) 400 MG capsule TAKE 1 CAPSULE BY MOUTH 3 TIMES DAILY. 270 capsule 1   midodrine (PROAMATINE) 2.5 MG tablet Take 2.5 mg by mouth 3 (three) times daily with meals. As needed for hypotension     montelukast (SINGULAIR) 10 MG tablet TAKE 1 TABLET BY MOUTH EVERYDAY AT BEDTIME 90 tablet 0   naproxen (NAPROSYN) 500 MG tablet Take 1 tablet (500 mg total) by mouth 2 (two) times daily with a meal. 30 tablet 0   propranolol (INDERAL) 10 MG tablet Take 10 mg by mouth 3 (three) times daily.     UNABLE TO FIND Take 2 capsules by mouth daily. Med Name: Zyflamend, 2 capsules once daily per patient     No current facility-administered medications for this visit.    Known medication allergies: Allergies  Allergen Reactions   Relpax [Eletriptan] Anaphylaxis    Throat swelling   Sulfa Antibiotics Hives and Rash   Gluten Meal    Latex Hives   Lamotrigine Rash     Physical examination: Blood pressure 110/60, pulse 71, temperature 98.3 F  (36.8 C), height 5' 4.5" (1.638 m), weight 170 lb (77.1 kg), SpO2 97%.  General: Alert, interactive, in no acute distress. HEENT: PERRLA, TMs pearly gray, turbinates minimally edematous without discharge, post-pharynx non erythematous. Neck: Supple without lymphadenopathy. Lungs: Clear to auscultation without wheezing, rhonchi or rales. {no increased work of breathing. CV: Normal S1, S2 without murmurs. Abdomen: Nondistended, nontender. Skin: Warm and dry, without lesions or rashes. Extremities:  No clubbing, cyanosis or edema. Neuro:   Grossly intact.  Diagnositics/Labs: Labs:  Component     Latest Ref Rng 11/20/2022  WBC     4.0 -  10.5 K/uL 7.1   RBC     3.87 - 5.11 Mil/uL 4.02   Hemoglobin     12.0 - 15.0 g/dL 16.1   HCT     09.6 - 04.5 % 38.5   MCV     78.0 - 100.0 fl 96.0   MCHC     30.0 - 36.0 g/dL 40.9   RDW     81.1 - 91.4 % 11.9   Platelets     150.0 - 400.0 K/uL 261.0   Neutrophils     43.0 - 77.0 % 51.7   Lymphocytes     12.0 - 46.0 % 40.5   Monocytes Relative     3.0 - 12.0 % 6.7   Eosinophil     0.0 - 5.0 % 0.6   Basophil     0.0 - 3.0 % 0.5   NEUT#     1.4 - 7.7 K/uL 3.7   Lymphocyte #     0.7 - 4.0 K/uL 2.9   Monocyte #     0.1 - 1.0 K/uL 0.5   Eosinophils Absolute     0.0 - 0.7 K/uL 0.0   Basophils Absolute     0.0 - 0.1 K/uL 0.0   Sodium     135 - 145 mEq/L 138   Potassium     3.5 - 5.1 mEq/L 4.1   Chloride     96 - 112 mEq/L 104   CO2     19 - 32 mEq/L 27   Glucose     70 - 99 mg/dL 92   BUN     6 - 23 mg/dL 14   Creatinine     7.82 - 1.20 mg/dL 9.56   Total Bilirubin     0.2 - 1.2 mg/dL 0.3   Alkaline Phosphatase     39 - 117 U/L 50   AST     0 - 37 U/L 18   ALT     0 - 35 U/L 12   Total Protein     6.0 - 8.3 g/dL 7.3   Albumin     3.5 - 5.2 g/dL 4.2   GFR     >21.30 mL/min 106.22   Calcium     8.6 - 10.2 mg/dL 9.4   Calcium     8.4 - 10.5 mg/dL 9.4   PTH, Intact     16 - 77 pg/mL 38   TSI     <140 % baseline    T4,Free(Direct)     0.60 - 1.60 ng/dL 8.65   Triiodothyronine,Free,Serum     2.3 - 4.2 pg/mL 3.7   TSH     0.35 - 5.50 uIU/mL 0.64   Uric Acid, Serum     2.4 - 7.0 mg/dL 5.6   Sed Rate     0 - 20 mm/hr 15    Component     Latest Ref Rng 04/17/2022  Anti Nuclear Antibody (ANA)     NEGATIVE  NEGATIVE    Component     Latest Ref Rng 04/17/2022  Iron     42 - 145 ug/dL 83   Transferrin     784.6 - 360.0 mg/dL 962.9   Saturation Ratios     20.0 - 50.0 % 23.8   TIBC     250.0 - 450.0 mcg/dL 528.4   PTH, Intact     16 - 77 pg/mL 16   Calcium     8.6 - 10.2 mg/dL  9.2   Vitamin B12     211 - 911 pg/mL 468   Thyroperoxidase Ab SerPl-aCnc     <9 IU/mL <1   RA Latex Turbid.     <14 IU/mL <14   Ferritin     10.0 - 291.0 ng/mL 30.5   Cyclic Citrullin Peptide Ab     UNITS <16     Assessment and plan: MCAS -Constellation of symptoms can be consistent with mast cell activation syndrome -Will need to do more formal workup at this time to evaluate for frank mastocytosis -Will obtain lab work for mastocytosis including tryptase level, alpha gal panel, environmental allergy panel, basic labs, urinary studies.  Further testing will be based on these results -For management of MCAS recommend you continue your current Zyrtec 3 times a day, Pepcid 3 times a day and Singulair daily regimen for your primary antihistamine, secondary antihistamine, antileukotriene respectively. Recommend starting cromolyn taken prior to meals that can help with gut symptom control.  Cromolyn is a mast cell stabilizer. -Have access to albuterol inhaler for relief of symptoms of having wheezing, cough, chest tightness or shortness of breath. -Have access to an epinephrine device in case of allergic reaction. Will provide with emergency action plan to follow in case of allergic reaction.  Follow-up pending lab work I appreciate the opportunity to take part in Angela Woodard's care. Please do not hesitate to contact  me with questions.  Sincerely,   Margo Aye, MD Allergy/Immunology [facility]

## 2023-01-19 NOTE — Progress Notes (Unsigned)
[name] [address] [place] [address] Phone: [phone] Subjective:   Bruce Donath, am serving as a scribe for Dr. Antoine Primas.  I'm seeing this patient by the request  of:  Cheatham, William W, DO  CC: Low back pain follow-up  ONG:[id number]  Angela Woodard is a 32 y.o. female coming in with complaint of back and neck pain. OMT 11/20/2022.  Patient was seen previously and did have a renal ultrasound showing a questionable mass.  MRI though of the abdomen and was unremarkable.  Patient states   Medications patient has been prescribed: Gabapentin, Singulair  Taking: yes          Reviewed prior external information including notes and imaging from previsou exam, outside providers and external EMR if available.   As well as notes that were available from care everywhere and other healthcare systems.  Past medical history, social, surgical and family history all reviewed in electronic medical record.  No pertanent information unless stated regarding to the chief complaint.   Past Medical History:  Diagnosis Date   Arthritis    Asthma    Celiac disease    Depression    Ehlers-Danlos, hypermobile type    Normal echo 2021   Fibromyalgia    History of kidney stones    Mast cell activation syndrome (HCC)    Migraine    Nephropathy    OCD (obsessive compulsive disorder)    POTS (postural orthostatic tachycardia syndrome)    Pruritus    Normal bile acids 06/06/2021   Seizure disorder (HCC)    Last reported event 05/15/2021- during pt sleep   Urticaria     Allergies  Allergen Reactions   Relpax [Eletriptan] Anaphylaxis    Throat swelling   Sulfa Antibiotics Hives and Rash   Gluten Meal    Latex Hives   Lamotrigine Rash     Review of Systems:  No headache, visual changes, nausea, vomiting, diarrhea, constipation, dizziness, abdominal pain, skin rash, fevers, chills, night sweats, weight loss, swollen lymph nodes,   joint swelling, chest pain, shortness of breath, mood changes. POSITIVE muscle aches, body aches, difficulty swallowing  Objective  Blood pressure 118/82, pulse 78, height 5' 4.5" (1.638 m), weight 173 lb (78.5 kg), SpO2 98%.   General: No apparent distress alert and oriented x3 mood and affect normal, dressed appropriately.  HEENT: Pupils equal, extraocular movements intact  Respiratory: Patient's speak in full sentences and does not appear short of breath  Cardiovascular: No lower extremity edema, non tender, no erythema  Gait relatively normal MSK:  Back does have some loss of lordosis noted.  Some tenderness to palpation in the paraspinal musculature.  Osteopathic findings  C7 flexed rotated and side bent left T3 extended rotated and side bent right inhaled rib T8 extended rotated and side bent left L2 flexed rotated and side bent right Sacrum right on right    Assessment and Plan:  History of primary IgA nephropathy Awaiting referral for nephrology.  Fibromyalgia Patient is doing a little better already with the naltrexone at low-dose.  Working with formal physical therapist.  Discussed with patient to continue to stay active.  Attempted osteopathic manipulation for this.  Hopeful that will make some improvement.  Follow-up with me again in 2 to 3 months.  Difficulty swallowing Patient states that she has some difficulty swallowing.  Describes that at 1 point she may have been diagnosed with a diverticulum but never finished workup.  Will  refer to GI for further evaluation if she would like.    Nonallopathic problems  Decision today to treat with OMT was based on Physical Exam  After verbal consent patient was treated with HVLA, ME, FPR techniques in cervical, rib, thoracic, lumbar, and sacral  areas  Patient tolerated the procedure well with improvement in symptoms  Patient given exercises, stretches and lifestyle modifications  See medications in patient  instructions if given  Patient will follow up in 4-8 weeks     The above documentation has been reviewed and is accurate and complete Judi Saa, DO         Note: This dictation was prepared with Dragon dictation along with smaller phrase technology. Any transcriptional errors that result from this process are unintentional.

## 2023-01-20 ENCOUNTER — Ambulatory Visit: Payer: Medicaid Other | Admitting: Family Medicine

## 2023-01-20 ENCOUNTER — Encounter: Payer: Self-pay | Admitting: Family Medicine

## 2023-01-20 VITALS — BP 118/82 | HR 78 | Ht 64.5 in | Wt 173.0 lb

## 2023-01-20 DIAGNOSIS — M9901 Segmental and somatic dysfunction of cervical region: Secondary | ICD-10-CM

## 2023-01-20 DIAGNOSIS — Z87448 Personal history of other diseases of urinary system: Secondary | ICD-10-CM

## 2023-01-20 DIAGNOSIS — M2142 Flat foot [pes planus] (acquired), left foot: Secondary | ICD-10-CM

## 2023-01-20 DIAGNOSIS — R1310 Dysphagia, unspecified: Secondary | ICD-10-CM | POA: Diagnosis not present

## 2023-01-20 DIAGNOSIS — M9903 Segmental and somatic dysfunction of lumbar region: Secondary | ICD-10-CM

## 2023-01-20 DIAGNOSIS — M9902 Segmental and somatic dysfunction of thoracic region: Secondary | ICD-10-CM | POA: Diagnosis not present

## 2023-01-20 DIAGNOSIS — M797 Fibromyalgia: Secondary | ICD-10-CM

## 2023-01-20 DIAGNOSIS — M9908 Segmental and somatic dysfunction of rib cage: Secondary | ICD-10-CM | POA: Diagnosis not present

## 2023-01-20 DIAGNOSIS — M2141 Flat foot [pes planus] (acquired), right foot: Secondary | ICD-10-CM

## 2023-01-20 DIAGNOSIS — M9904 Segmental and somatic dysfunction of sacral region: Secondary | ICD-10-CM

## 2023-01-20 DIAGNOSIS — M2140 Flat foot [pes planus] (acquired), unspecified foot: Secondary | ICD-10-CM

## 2023-01-20 NOTE — Assessment & Plan Note (Signed)
Patient has some hypermobility that likely is contributing.  Discussed with patient different treatment options but patient has elected to follow-up with orthopedic surgery and would like referral.

## 2023-01-20 NOTE — Assessment & Plan Note (Signed)
Patient states that she has some difficulty swallowing.  Describes that at 1 point she may have been diagnosed with a diverticulum but never finished workup.  Will refer to GI for further evaluation if she would like.

## 2023-01-20 NOTE — Assessment & Plan Note (Signed)
Patient is doing a little better already with the naltrexone at low-dose.  Working with formal physical therapist.  Discussed with patient to continue to stay active.  Attempted osteopathic manipulation for this.  Hopeful that will make some improvement.  Follow-up with me again in 2 to 3 months.

## 2023-01-20 NOTE — Patient Instructions (Addendum)
Referral to Ortho and GI Referral of Nephrology already in See you again in 2 months

## 2023-01-20 NOTE — Assessment & Plan Note (Signed)
Awaiting referral for nephrology.

## 2023-01-22 ENCOUNTER — Ambulatory Visit: Payer: Medicaid Other | Admitting: Allergy

## 2023-01-22 ENCOUNTER — Telehealth: Payer: Medicaid Other | Admitting: Physician Assistant

## 2023-01-22 DIAGNOSIS — J029 Acute pharyngitis, unspecified: Secondary | ICD-10-CM

## 2023-01-22 LAB — ALLERGENS W/TOTAL IGE AREA 2

## 2023-01-22 LAB — ALPHA-GAL PANEL
Allergen Lamb IgE: <0.1 kU/L
Beef IgE: <0.1 kU/L
IgE (Immunoglobulin E), Serum: 16 IU/mL (ref 6–495)
O215-IgE Alpha-Gal: <0.1 kU/L
Pork IgE: <0.1 kU/L

## 2023-01-22 LAB — IGE FOOD BASIC W/COMPONENT RFX
Codfish IgE: <0.1 kU/L
F001-IgE Egg White: <0.1 kU/L
F002-IgE Milk: <0.1 kU/L
Peanut, IgE: <0.1 kU/L
Soybean IgE: <0.1 kU/L
Wheat IgE: <0.1 kU/L

## 2023-01-22 LAB — TRYPTASE: Tryptase: 4.7 ug/L (ref 2.2–13.2)

## 2023-01-22 LAB — PROSTAGLANDIN D2, SERUM: Prostaglandin D2, serum: 3.5 pg/mL

## 2023-01-22 MED ORDER — AMOXICILLIN 500 MG PO TABS
500.0000 mg | ORAL_TABLET | Freq: Two times a day (BID) | ORAL | 0 refills | Status: AC
Start: 2023-01-22 — End: 2023-02-01

## 2023-01-22 NOTE — Progress Notes (Signed)
I have spent 5 minutes in review of e-visit questionnaire, review and updating patient chart, medical decision making and response to patient.   William Cody Martin, PA-C    

## 2023-01-22 NOTE — Progress Notes (Signed)
E-Visit for Sore Throat - Strep Symptoms  We are sorry that you are not feeling well.  Here is how we plan to help!  Based on what you have shared with me it is likely that you have a bacterial pharyngitis.    This is an infection cause by bacteria and is treated with antibiotics.  I have prescribed Amoxicillin 500 mg twice a day for 10 days.Cromolyn can sometimes cause some painful or difficulty swallowing or other stomach/throat symptoms but should not cause the exudate (pus) noted on your left tonsil.  For throat pain, we recommend over the counter oral pain relief medications such as acetaminophen, or anti-inflammatory medications such as ibuprofen or naproxen sodium. Topical treatments such as oral throat lozenges or sprays may be used as needed. Strep infections are not as easily transmitted as other respiratory infections, however we still recommend that you avoid close contact with loved ones, especially the very young and elderly.  Remember to wash your hands thoroughly throughout the day as this is the number one way to prevent the spread of infection and wipe down door knobs and counters with disinfectant.   Home Care: Only take medications as instructed by your medical team. Complete the entire course of an antibiotic. Do not take these medications with alcohol. A steam or ultrasonic humidifier can help congestion.  You can place a towel over your head and breathe in the steam from hot water coming from a faucet. Avoid close contacts especially the very young and the elderly. Cover your mouth when you cough or sneeze. Always remember to wash your hands.  Get Help Right Away If: You develop worsening fever or sinus pain. You develop a severe head ache or visual changes. Your symptoms persist after you have completed your treatment plan.  Make sure you Understand these instructions. Will watch your condition. Will get help right away if you are not doing well or get  worse.   Thank you for choosing an e-visit.  Your e-visit answers were reviewed by a board certified advanced clinical practitioner to complete your personal care plan. Depending upon the condition, your plan could have included both over the counter or prescription medications.  Please review your pharmacy choice. Make sure the pharmacy is open so you can pick up prescription now. If there is a problem, you may contact your provider through [employer] and have the prescription routed to another pharmacy.  Your safety is important to [place]. If you have drug allergies check your prescription carefully.   For the next 24 hours you can use MyChart to ask questions about today's visit, request a non-urgent call back, or ask for a work or school excuse. You will get an email in the next two days asking about your experience. I hope that your e-visit has been valuable and will speed your recovery.

## 2023-01-23 LAB — LEUKOTRIENE E4, 24 HR, U

## 2023-01-23 LAB — N-METHYLHISTAMINE, 24 HR, U

## 2023-01-26 LAB — LEUKOTRIENE E4, 24 HR, U

## 2023-01-26 LAB — N-METHYLHISTAMINE, 24 HR, U

## 2023-01-28 ENCOUNTER — Other Ambulatory Visit: Payer: Self-pay

## 2023-01-28 LAB — N-METHYLHISTAMINE, 24 HR, U

## 2023-01-28 LAB — LEUKOTRIENE E4, 24 HR, U

## 2023-01-29 ENCOUNTER — Encounter: Payer: Medicaid Other | Admitting: Physical Medicine & Rehabilitation

## 2023-01-29 LAB — LEUKOTRIENE E4, 24 HR, U: Creatinine Concentration,24 HR: 44 mg/dL

## 2023-01-29 LAB — N-METHYLHISTAMINE, 24 HR, U

## 2023-02-01 ENCOUNTER — Telehealth: Payer: Medicaid Other | Admitting: Family Medicine

## 2023-02-01 DIAGNOSIS — R059 Cough, unspecified: Secondary | ICD-10-CM

## 2023-02-01 DIAGNOSIS — R509 Fever, unspecified: Secondary | ICD-10-CM | POA: Diagnosis not present

## 2023-02-01 DIAGNOSIS — R52 Pain, unspecified: Secondary | ICD-10-CM

## 2023-02-01 NOTE — Progress Notes (Signed)
Virtual Visit Consent   [name], you are scheduled for a virtual visit with a [facility] provider today. Just as with appointments in the office, your consent must be obtained to participate. Your consent will be active for this visit and any virtual visit you may have with one of our providers in the next 365 days. If you have a MyChart account, a copy of this consent can be sent to you electronically.  As this is a virtual visit, video technology does not allow for your provider to perform a traditional examination. This may limit your provider's ability to fully assess your condition. If your provider identifies any concerns that need to be evaluated in person or the need to arrange testing (such as labs, EKG, etc.), we will make arrangements to do so. Although advances in technology are sophisticated, we cannot ensure that it will always work on either your end or our end. If the connection with a video visit is poor, the visit may have to be switched to a telephone visit. With either a video or telephone visit, we are not always able to ensure that we have a secure connection.  By engaging in this virtual visit, you consent to the provision of healthcare and authorize for your insurance to be billed (if applicable) for the services provided during this visit. Depending on your insurance coverage, you may receive a charge related to this service.  I need to obtain your verbal consent now. Are you willing to proceed with your visit today? Angela Woodard has provided verbal consent on 02/01/2023 for a virtual visit (video or telephone). Georgana Curio, FNP  Date: 02/01/2023 12:37 PM  Virtual Visit via Video Note   I, Georgana Curio, connected with  GEOFFREY NEDVED  ([id number], 02-25-1991) on 02/01/23 at 12:45 PM EDT by a video-enabled telemedicine application and verified that I am speaking with the correct person using two identifiers.  Location: Patient: Virtual Visit Location  Patient: Home Provider: Virtual Visit Location Provider: Home Office   I discussed the limitations of evaluation and management by telemedicine and the availability of in person appointments. The patient expressed understanding and agreed to proceed.    History of Present Illness: Angela Woodard is a 32 y.o. who identifies as a female who was assigned female at birth, and is being seen today for cough, fever up to 101. Took antibiotics and throat has improved but cough, and pain with deep breath in ribs has started. .She also has a whooshing in ears persistent. She has 4 children. She is concerned about pneumonia as I am.   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   Depression affecting pregnancy in second trimester, antepartum 09/21/2017   Alteration consciousness 12/04/2015    Allergies:  Allergies  Allergen Reactions   Relpax [Eletriptan] Anaphylaxis    Throat swelling   Sulfa Antibiotics Hives and Rash   Gluten Meal    Latex Hives   Lamotrigine Rash   Medications:  Current Outpatient Medications:    albuterol (VENTOLIN HFA) 108 (90 Base) MCG/ACT inhaler, Inhale  2 puffs into the lungs every 6 (six) hours as needed for wheezing or shortness of breath., Disp: 8 g, Rfl: 2   amoxicillin (AMOXIL) 500 MG tablet, Take 1 tablet (500 mg total) by mouth 2 (two) times daily for 10 days., Disp: 20 tablet, Rfl: 0   amphetamine-dextroamphetamine (ADDERALL) 20 MG tablet, Take 20 mg by mouth 2 (two) times daily. 20mg  in the morning and 10mg  at night, Disp: ,  Rfl:    cetirizine (ZYRTEC) 10 MG tablet, Take 10 mg by mouth daily., Disp: , Rfl:    cromolyn (GASTROCROM) 100 MG/5ML solution, Take 5 mLs (100 mg total) by mouth 3 (three) times daily before meals., Disp: 480 mL, Rfl: 5   cyclobenzaprine (FLEXERIL) 5 MG tablet, Take 1 tablet (5 mg total) by mouth at bedtime as needed for muscle spasms., Disp: 30 tablet, Rfl: 3   EPINEPHrine 0.3 mg/0.3 mL IJ SOAJ injection, Inject 0.3 mg into the muscle as needed for anaphylaxis., Disp: 1 each, Rfl: 2   fluticasone (FLONASE) 50 MCG/ACT nasal spray, Place 2 sprays into both nostrils daily., Disp: 16 g, Rfl: 0   gabapentin (NEURONTIN) 400 MG capsule, TAKE 1 CAPSULE BY MOUTH 3 TIMES DAILY., Disp: 270 capsule, Rfl: 1   midodrine (PROAMATINE) 2.5 MG tablet, Take 2.5 mg by mouth 3 (three) times daily with meals. As needed for hypotension, Disp: , Rfl:    montelukast (SINGULAIR) 10 MG tablet, TAKE 1 TABLET BY MOUTH EVERYDAY AT BEDTIME, Disp: 90 tablet, Rfl: 0   naproxen (NAPROSYN) 500 MG tablet, Take 1 tablet (500 mg total) by mouth 2 (two) times daily with a meal., Disp: 30 tablet, Rfl: 0   propranolol (INDERAL) 10 MG tablet, Take 10 mg by mouth 3 (three) times daily., Disp: , Rfl:    UNABLE TO FIND, Take 2 capsules by mouth daily. Med Name: Zyflamend, 2 capsules once daily per patient, Disp: , Rfl:   Observations/Objective: Patient is well-developed, well-nourished in no acute distress.  Resting comfortably  at home.  Head is normocephalic, atraumatic.  No labored breathing.  Speech is clear and coherent with logical content.  Patient is alert and oriented at baseline.    Assessment and Plan: 1. Fever, unspecified fever cause  2. Pain aggravated by coughing and deep breathing  Discussed current symptoms with what she has taken. Possibility for pneumonia with persistent fever. Instructed to to go to the UC for further eval.   Follow Up Instructions: I discussed the assessment and treatment plan with the  patient. The patient was provided an opportunity to ask questions and all were answered. The patient agreed with the plan and demonstrated an understanding of the instructions.  A copy of instructions were sent to the patient via MyChart unless otherwise noted below.     The patient was advised to call back or seek an in-person evaluation if the symptoms worsen or if the condition fails to improve as anticipated.  Time:  I spent 8 minutes with the patient via telehealth technology discussing the above problems/concerns.    Georgana Curio, FNP

## 2023-02-01 NOTE — Patient Instructions (Signed)
Fever, Adult     A fever is a high body temperature that is 100.31F (38C) or higher. Brief mild or moderate fevers generally have no lasting effects, and they often do not need treatment. Moderate or high fevers can feel uncomfortable and can sometimes be a sign of a serious problem. Fevers can also cause dehydration because the body may sweat, especially if the fever keeps coming back or lasts a long time. You can use a thermometer to check for a fever. Body temperature can change with: Age. Time of day. Where the temperature is taken, such as in the mouth, rectum, ear, under the arm, or on the forehead. Follow these instructions at home: Medicines Take over-the-counter and prescription medicines only as told by your health care provider. Follow instructions on how much medicine to take and how often. If you were prescribed antibiotics, take them as told by your provider. Do not stop using the antibiotic even if you start to feel better. General instructions Watch for any changes in your symptoms. Let your provider know about them. Rest as needed. Drink enough fluid to keep your pee (urine) pale yellow. This helps to prevent dehydration. Bathe or sponge bathe with room-temperature water to help lower your body temperature as needed. Do not use cold water. Do not use too many blankets or wear heavy clothes. Stay home from work and public places for at least 24 hours after your fever is gone. Your fever should be gone without having to use medicines. Contact a health care provider if: You have vomiting or diarrhea that does not get better. You cannot eat or drink without vomiting. You have pain when you pee (urinate). Your symptoms do not get better with treatment or you have new symptoms. You have a skin rash. You have signs of dehydration, such as: Dark pee, very little pee, or no pee. Cracked lips or dry mouth. Sunken eyes. Sleepiness. Weakness. Get help right away if: You have  shortness of breath or trouble breathing. You feel dizzy or you faint. You are confused and do not know the time of day, where you are, or who you are (disoriented). You have severe pain in your abdomen. These symptoms may be an emergency. Get help right away. Call 911. Do not wait to see if the symptoms will go away. Do not drive yourself to the hospital. This information is not intended to replace advice given to you by your health care provider. Make sure you discuss any questions you have with your health care provider. Document Revised: 03/25/2022 Document Reviewed: 03/25/2022 Elsevier Patient Education  2024 [employer].

## 2023-02-05 ENCOUNTER — Ambulatory Visit (HOSPITAL_BASED_OUTPATIENT_CLINIC_OR_DEPARTMENT_OTHER): Payer: Medicaid Other | Attending: Physical Medicine & Rehabilitation | Admitting: Physical Therapy

## 2023-02-10 ENCOUNTER — Encounter: Payer: Self-pay | Admitting: Neurology

## 2023-02-16 ENCOUNTER — Other Ambulatory Visit: Payer: Medicaid Other

## 2023-03-02 ENCOUNTER — Ambulatory Visit (HOSPITAL_BASED_OUTPATIENT_CLINIC_OR_DEPARTMENT_OTHER): Payer: Medicaid Other | Admitting: Physical Therapy

## 2023-03-05 ENCOUNTER — Encounter: Payer: Medicaid Other | Attending: Physical Medicine & Rehabilitation | Admitting: Physical Medicine & Rehabilitation

## 2023-03-05 DIAGNOSIS — F32A Depression, unspecified: Secondary | ICD-10-CM | POA: Insufficient documentation

## 2023-03-05 DIAGNOSIS — M797 Fibromyalgia: Secondary | ICD-10-CM | POA: Insufficient documentation

## 2023-03-05 DIAGNOSIS — Q796 Ehlers-Danlos syndrome, unspecified: Secondary | ICD-10-CM | POA: Insufficient documentation

## 2023-03-08 ENCOUNTER — Other Ambulatory Visit: Payer: Self-pay | Admitting: Family Medicine

## 2023-03-18 ENCOUNTER — Other Ambulatory Visit: Payer: Self-pay | Admitting: Physical Medicine & Rehabilitation

## 2023-03-18 MED ORDER — NONFORMULARY OR COMPOUNDED ITEM: 1.0000 mg | Freq: Every day | 1 refills | Status: DC

## 2023-03-18 MED ORDER — NON FORMULARY
2.0000 mg | Freq: Every day | Status: DC
Start: 2023-03-19 — End: 2023-03-18

## 2023-03-23 NOTE — Progress Notes (Deleted)
  [name] [address] [place] [address] Phone: [phone] Subjective:    I'm seeing this patient by the request  of:  Lamont Dowdy, DO  CC: neck and back exam   [id number]  Angela Woodard is a 32 y.o. female coming in with complaint of back and neck pain. OMT 01/20/2023. Patient states   Medications patient has been prescribed: None  Taking:         Reviewed prior external information including notes and imaging from previsou exam, outside providers and external EMR if available.   As well as notes that were available from care everywhere and other healthcare systems.  Past medical history, social, surgical and family history all reviewed in electronic medical record.  No pertanent information unless stated regarding to the chief complaint.   Past Medical History:  Diagnosis Date   Arthritis    Asthma    Celiac disease    Depression    Ehlers-Danlos, hypermobile type    Normal echo 2021   Fibromyalgia    History of kidney stones    Mast cell activation syndrome (HCC)    Migraine    Nephropathy    OCD (obsessive compulsive disorder)    POTS (postural orthostatic tachycardia syndrome)    Pruritus    Normal bile acids 06/06/2021   Seizure disorder (HCC)    Last reported event 05/15/2021- during pt sleep   Urticaria     Allergies  Allergen Reactions   Relpax [Eletriptan] Anaphylaxis    Throat swelling   Sulfa Antibiotics Hives and Rash   Gluten Meal    Latex Hives   Lamotrigine Rash     Review of Systems:  No headache, visual changes, nausea, vomiting, diarrhea, constipation, dizziness, abdominal pain, skin rash, fevers, chills, night sweats, weight loss, swollen lymph nodes, body aches, joint swelling, chest pain, shortness of breath, mood changes. POSITIVE muscle aches  Objective  There were no vitals taken for this visit.   General: No apparent distress alert and oriented x3 mood and affect  normal, dressed appropriately.  HEENT: Pupils equal, extraocular movements intact  Respiratory: Patient's speak in full sentences and does not appear short of breath  Cardiovascular: No lower extremity edema, non tender, no erythema  Gait MSK:  Back exam shows  Neck exam shows   Osteopathic findings  C2 flexed rotated and side bent right C6 flexed rotated and side bent left T3 extended rotated and side bent right inhaled rib T9 extended rotated and side bent left L2 flexed rotated and side bent right Sacrum right on right     Assessment and Plan:  No problem-specific Assessment & Plan notes found for this encounter.    Nonallopathic problems  Decision today to treat with OMT was based on Physical Exam  After verbal consent patient was treated with HVLA, ME, FPR techniques in cervical, rib, thoracic, lumbar, and sacral  areas  Patient tolerated the procedure well with improvement in symptoms  Patient given exercises, stretches and lifestyle modifications  See medications in patient instructions if given  Patient will follow up in 4-8 weeks     The above documentation has been reviewed and is accurate and complete Judi Saa, DO         Note: This dictation was prepared with Dragon dictation along with smaller phrase technology. Any transcriptional errors that result from this process are unintentional.

## 2023-03-24 ENCOUNTER — Ambulatory Visit: Payer: Medicaid Other | Admitting: Family Medicine

## 2023-03-26 DIAGNOSIS — F988 Other specified behavioral and emotional disorders with onset usually occurring in childhood and adolescence: Secondary | ICD-10-CM | POA: Insufficient documentation

## 2023-04-02 ENCOUNTER — Telehealth: Payer: Medicaid Other | Admitting: Physician Assistant

## 2023-04-02 DIAGNOSIS — R3989 Other symptoms and signs involving the genitourinary system: Secondary | ICD-10-CM

## 2023-04-02 MED ORDER — NITROFURANTOIN MONOHYD MACRO 100 MG PO CAPS: 100.0000 mg | ORAL_CAPSULE | Freq: Two times a day (BID) | ORAL | 0 refills | Status: DC

## 2023-04-02 NOTE — Progress Notes (Signed)

## 2023-04-09 ENCOUNTER — Institutional Professional Consult (permissible substitution): Payer: Medicaid Other | Admitting: Plastic Surgery

## 2023-05-06 ENCOUNTER — Encounter: Payer: Self-pay | Admitting: Neurology

## 2023-05-06 ENCOUNTER — Telehealth: Payer: Medicaid Other | Admitting: Nurse Practitioner

## 2023-05-06 ENCOUNTER — Encounter: Payer: Self-pay | Admitting: Nurse Practitioner

## 2023-05-06 ENCOUNTER — Telehealth: Payer: Medicaid Other | Admitting: Neurology

## 2023-05-06 VITALS — Ht 64.0 in | Wt 150.0 lb

## 2023-05-06 DIAGNOSIS — R4189 Other symptoms and signs involving cognitive functions and awareness: Secondary | ICD-10-CM

## 2023-05-06 DIAGNOSIS — G40909 Epilepsy, unspecified, not intractable, without status epilepticus: Secondary | ICD-10-CM | POA: Diagnosis not present

## 2023-05-06 DIAGNOSIS — B084 Enteroviral vesicular stomatitis with exanthem: Secondary | ICD-10-CM

## 2023-05-06 DIAGNOSIS — H9312 Tinnitus, left ear: Secondary | ICD-10-CM

## 2023-05-06 DIAGNOSIS — R258 Other abnormal involuntary movements: Secondary | ICD-10-CM | POA: Diagnosis not present

## 2023-05-06 MED ORDER — NYSTATIN 100000 UNIT/ML MT SUSP: 10.0000 mL | Freq: Four times a day (QID) | OROMUCOSAL | 1 refills | Status: AC | PRN

## 2023-05-06 NOTE — Progress Notes (Signed)
Virtual Visit Consent   Angela Woodard, you are scheduled for a virtual visit with a [facility] provider today. Just as with appointments in the office, your consent must be obtained to participate. Your consent will be active for this visit and any virtual visit you may have with one of our providers in the next 365 days. If you have a MyChart account, a copy of this consent can be sent to you electronically.  As this is a virtual visit, video technology does not allow for your provider to perform a traditional examination. This may limit your provider's ability to fully assess your condition. If your provider identifies any concerns that need to be evaluated in person or the need to arrange testing (such as labs, EKG, etc.), we will make arrangements to do so. Although advances in technology are sophisticated, we cannot ensure that it will always work on either your end or our end. If the connection with a video visit is poor, the visit may have to be switched to a telephone visit. With either a video or telephone visit, we are not always able to ensure that we have a secure connection.  By engaging in this virtual visit, you consent to the provision of healthcare and authorize for your insurance to be billed (if applicable) for the services provided during this visit. Depending on your insurance coverage, you may receive a charge related to this service.  I need to obtain your verbal consent now. Are you willing to proceed with your visit today? Angela Woodard has provided verbal consent on 05/06/2023 for a virtual visit (video or telephone). Angela Simas, FNP  Date: 05/06/2023 3:56 PM  Virtual Visit via Video Note   I, Angela Woodard, connected with  Angela Woodard  ([id number], [birth year]) on 05/06/23 at  4:00 PM EDT by a video-enabled telemedicine application and verified that I am speaking with the correct person using two identifiers.  Location: Patient: Virtual Visit Location  Patient: Home Provider: Virtual Visit Location Provider: Home Office   I discussed the limitations of evaluation and management by telemedicine and the availability of in person appointments. The patient expressed understanding and agreed to proceed.    History of Present Illness: Angela Woodard is a 32 y.o. who identifies as a female who was assigned female at birth, and is being seen today for sore throat , nausea at times- noted sores in her mouth  She has a mild rash on her hands   She has been taking tylenol and using an oral  rins  Her children have HFM disease currently   Hardest thing is eating due to sore throat   See significant  medical history below  Problems:  Patient Active Problem List   Diagnosis Date Noted   Difficulty swallowing 01/20/2023   Pes planus of both feet 01/20/2023   History of seizure disorder 11/20/2022   Thyroid enlarged 08/18/2022   Ehlers-Danlos syndrome 12/09/2021   Celiac disease 05/02/2021   Fibromyalgia 05/02/2021   Cardiac disease during pregnancy in second trimester 05/01/2021   Postural orthostatic tachycardia syndrome 09/03/2020   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   Depression affecting pregnancy in second trimester, antepartum 09/21/2017   Alteration consciousness 12/04/2015    Allergies:  Allergies  Allergen Reactions   Relpax [Eletriptan] Anaphylaxis    Throat swelling   Sulfa Antibiotics Hives and Rash   Gluten Meal    Latex Hives   Lamotrigine Rash   Medications:  Current Outpatient  Medications:    albuterol (VENTOLIN HFA) 108 (90 Base) MCG/ACT inhaler, Inhale 2 puffs into the lungs every 6 (six) hours as needed for wheezing or shortness of breath., Disp: 8 g, Rfl: 2   amphetamine-dextroamphetamine (ADDERALL) 20 MG tablet, Take 20 mg by mouth 2 (two) times daily. 20mg  in the morning and 10mg  at night, Disp: , Rfl:    cetirizine (ZYRTEC) 10 MG tablet, Take 30 mg by mouth daily., Disp: , Rfl:    cromolyn  (GASTROCROM) 100 MG/5ML solution, Take 5 mLs (100 mg total) by mouth 3 (three) times daily before meals., Disp: 480 mL, Rfl: 5   cyclobenzaprine (FLEXERIL) 5 MG tablet, Take 1 tablet (5 mg total) by mouth at bedtime as needed for muscle spasms., Disp: 30 tablet, Rfl: 3   EPINEPHrine 0.3 mg/0.3 mL IJ SOAJ injection, Inject 0.3 mg into the muscle as needed for anaphylaxis., Disp: 1 each, Rfl: 2   fluticasone (FLONASE) 50 MCG/ACT nasal spray, Place 2 sprays into both nostrils daily., Disp: 16 g, Rfl: 0   gabapentin (NEURONTIN) 400 MG capsule, TAKE 1 CAPSULE BY MOUTH 3 TIMES DAILY., Disp: 270 capsule, Rfl: 1   midodrine (PROAMATINE) 2.5 MG tablet, Take 2.5 mg by mouth 3 (three) times daily with meals. As needed for hypotension, Disp: , Rfl:    montelukast (SINGULAIR) 10 MG tablet, TAKE 1 TABLET BY MOUTH EVERYDAY AT BEDTIME, Disp: 90 tablet, Rfl: 0   naproxen (NAPROSYN) 500 MG tablet, Take 1 tablet (500 mg total) by mouth 2 (two) times daily with a meal., Disp: 30 tablet, Rfl: 0   NONFORMULARY OR COMPOUNDED ITEM, Take 1-2 mg by mouth daily. Naltrexone, Disp: 100 each, Rfl: 1   propranolol (INDERAL) 10 MG tablet, Take 10 mg by mouth 3 (three) times daily., Disp: , Rfl:    UNABLE TO FIND, Take 2 capsules by mouth daily. Med Name: Zyflamend, 2 capsules once daily per patient (Patient not taking: Reported on 05/06/2023), Disp: , Rfl:   Observations/Objective: Patient is well-developed, well-nourished in no acute distress.  Resting comfortably  at home.  Head is normocephalic, atraumatic.  No labored breathing.  Speech is clear and coherent with logical content.  Patient is alert and oriented at baseline.    Assessment and Plan:   1. Hand, foot and mouth disease  Immune support advised & discussed     - magic mouthwash (nystatin, lidocaine, diphenhydrAMINE, alum & mag hydroxide) suspension; Swish and spit 10 mLs 4 (four) times daily as needed for up to 14 days for mouth pain.  Dispense: 180 mL;  Refill: 1     Follow Up Instructions: I discussed the assessment and treatment plan with the patient. The patient was provided an opportunity to ask questions and all were answered. The patient agreed with the plan and demonstrated an understanding of the instructions.  A copy of instructions were sent to the patient via MyChart unless otherwise noted below.    The patient was advised to call back or seek an in-person evaluation if the symptoms worsen or if the condition fails to improve as anticipated.    Angela Simas, FNP

## 2023-05-06 NOTE — Progress Notes (Signed)
Virtual Visit via Video Note The purpose of this virtual visit is to provide medical care while limiting exposure to the novel coronavirus.    Consent was obtained for video visit:  Yes.   Answered questions that patient had about telehealth interaction:  Yes.     Pt location: Home Physician Location: office Name of referring provider:  Lamont Dowdy, DO I connected with Angela Woodard at patients initiation/request on 05/06/2023 at 11:30 AM EDT by video enabled telemedicine application and verified that I am speaking with the correct person using two identifiers. Pt MRN:  [id number] Pt DOB:  1991/02/09 Video Participants:  Angela Woodard   History of Present Illness:  The patient had a virtual video visit on 05/06/2023. She was last seen in the neurology clinic 4 months ago for seizures. She is currently symptomatic from hand foot and mouth disease that all her children have had. Since her last visit, she had a normal 1-hour wake and sleep EEG in 01/2023. Her insurance did not approve brain MRI. She denies any convulsions since her last visit but has had "seizure-ry" symptoms. She has had periods where she is dissociating, staring off/zoned out, maybe a few times not responsive. She thinks they are when her blood pressure bottoms out. She feels there was a possible nocturnal seizure when she woke up in more pain than usual. She has noticed an uptick in her muscle spasms, they have been a lot worse. Her leg would jerk, neck spasms have gotten more aggressive. She reports brain fog is worse, she used to have a photographic memory so the memory changes are so noticeable. She forgets her medications, especially the afternoon pills. She has started using a pillbox.The Gabapentin does help her, she has not had any breakthrough seizures since taking Gabapentin 400mg  TID for pain. She reports tinnitus in the left ear, she hears the ocean. Her biggest issues are sleep and "weird GI  symptoms" affecting her esophagus and bowel. She has chronic fatigue and feels it is 20 times worse.    History on Initial Assessment 12/29/2022: This is a 32 year old right-handed woman with a history of Ehlers-Danlos syndrome, IgA nephropathy, Mast Cell Activation, POTS, chronic pain, fibromyalgia, ADHD, anxiety/depression/PTSD, presenting to establish care for seizures. Records from her prior neurologist at [facility], Dr. Verdie Mosher, were reviewed. She started having seizures at age 31. She would have rare generalized convulsions that increased in frequency over the years. She would have periods where she would stare/become unresponsive, sometimes occurring a couple of times a day. Her last visit at [facility] was in 10/2021. At that time, she was on Gabapentin for pain. She has had EEGs and brain imaging in the past reportedly normal. She was admitted to [facility] from May 13-18, 2023 where EEG showed bilateral temporal slowing that improved as medications were weaned, no clear discharges. She had an episode of "brain soup" and and episode of staring with no EEG changes seen.  Per notes, her husband shared a video that was also atypical and seemed to have non-epileptic elements. She was diagnosed with Co-existing epilepsy and Functional Neurological disorder. She was discharged on Lacosamide 150mg  BID and Gabapentin 400mg  TID. She reports feeling unheard when she was in the EMU.   She describes an aura of overwhelming anxiety with heart palpitations, feeling off, followed by full body convulsions. She has staring type events with unresponsiveness for 2 minutes. In 10/2021, she was having convulsions every 2-3 months and staring spells  every month. She had tried Levetiracetam (fatigue, ineffective), Lamotrigine (rash), and Lacosamide, which per notes improved the seizures but caused brain fog, memory lapses, and fatigue. Lacosamide increased to 150mg  BID. She states that she weaned off the Lacosamide over 2 months ago, and  the brain fog, fatigue, feeling like brain was being melted, improved off medication. She was having blurrred vision and more tremors than usual, which also got better. She is only on Gabapentin 400mg  TID without side effects. She reports the last GTC was over a year ago. They tend to be around night, usually her brain feels "squeaky," she used to get really angry, then would not recall events. She occasionally tastes blood (tasting it during the visit), then may lose consciousness. Her last staring/unresponsive episode was over 6 months ago. She has less of an aura with them, she just does not feel right. She thinks these started in childhood, her mom recalls at age 89 playing soccer she stopped with fists clenched, staring. They thought she was angry and "put me in therapy." Her mother has seen one episode of staring where she was out of it. She is very exhausted after them.   She states that her body is "a mess just in general." She has had migraines and cluster headaches since childhood, with pain behind the eyes and in the back of her head. She has stabbing pains on the sides. There is nausea/vomiting with the migraines. Recently she has been having a migraine once a week that she attributes to "stress and other things." She takes prn Excedrin which helps. She has prn midodrine for hypotension. She reports being in pain "24/7." Spine pain is the most debilitating on a daily basis. She has an MRI tomorrow for a kidney mass. She seen Pain Management on Thursday. She gets 4-5 hours of sleep and is always fatigued. Mood is better since restarting ADHD medication, also helping with her anxiety. Her therapist told her "you're too much for me." She states "I'm in so much pain it leads to depression." She has muscle spasms that are more with Effexor. She lives with her husband and 4 children ages 45, 29, 2, and 1. No pregnancy plans, they use the barrier method for contraception. She drinks alcohol socially. She does  not drive.   Epilepsy Risk Factors:  A paternal cousin has seizures. She had a normal birth and early development.  There is no history of febrile convulsions, CNS infections such as meningitis/encephalitis, significant traumatic brain injury, neurosurgical procedures.  Prior AEDs: Levetiracetam, Lamotrigine, Lacosamide  Diagnostic Data: EEGs: Per notes, ambulatory EEG in 2017 with [facility] normal EMU admission at [facility] May 13-18, 2023: EEG showed bilateral temporal slowing that improved as medications were weaned, no clear discharges. She had an episode of "brain soup" and and episode of staring with no EEG changes seen. MRI: MRI brain without contrast at [facility] 05/2021 normal   Current Outpatient Medications on File Prior to Visit  Medication Sig Dispense Refill   albuterol (VENTOLIN HFA) 108 (90 Base) MCG/ACT inhaler Inhale 2 puffs into the lungs every 6 (six) hours as needed for wheezing or shortness of breath. 8 g 2   amphetamine-dextroamphetamine (ADDERALL) 20 MG tablet Take 20 mg by mouth 2 (two) times daily. 20mg  in the morning and 10mg  at night     cetirizine (ZYRTEC) 10 MG tablet Take 30 mg by mouth daily.     cromolyn (GASTROCROM) 100 MG/5ML solution Take 5 mLs (100 mg total) by mouth 3 (three)  times daily before meals. 480 mL 5   cyclobenzaprine (FLEXERIL) 5 MG tablet Take 1 tablet (5 mg total) by mouth at bedtime as needed for muscle spasms. 30 tablet 3   EPINEPHrine 0.3 mg/0.3 mL IJ SOAJ injection Inject 0.3 mg into the muscle as needed for anaphylaxis. 1 each 2   fluticasone (FLONASE) 50 MCG/ACT nasal spray Place 2 sprays into both nostrils daily. 16 g 0   gabapentin (NEURONTIN) 400 MG capsule TAKE 1 CAPSULE BY MOUTH 3 TIMES DAILY. 270 capsule 1   midodrine (PROAMATINE) 2.5 MG tablet Take 2.5 mg by mouth 3 (three) times daily with meals. As needed for hypotension     montelukast (SINGULAIR) 10 MG tablet TAKE 1 TABLET BY MOUTH EVERYDAY AT BEDTIME 90 tablet 0   naproxen (NAPROSYN) 500 MG  tablet Take 1 tablet (500 mg total) by mouth 2 (two) times daily with a meal. 30 tablet 0   NONFORMULARY OR COMPOUNDED ITEM Take 1-2 mg by mouth daily. Naltrexone 100 each 1   propranolol (INDERAL) 10 MG tablet Take 10 mg by mouth 3 (three) times daily.     UNABLE TO FIND Take 2 capsules by mouth daily. Med Name: Zyflamend, 2 capsules once daily per patient (Patient not taking: Reported on 05/06/2023)     No current facility-administered medications on file prior to visit.     Observations/Objective:   Vitals:   05/06/23 1114  Weight: 150 lb (68 kg)  Height: 5\' 4"  (1.626 m)   GEN:  The patient appears stated age and is in NAD.  Neurological examination: Patient is awake, alert. No aphasia or dysarthria. Intact fluency and comprehension. Cranial nerves: Extraocular movements intact. No facial asymmetry. Motor: moves all extremities symmetrically, at least anti-gravity x 4.    Assessment and Plan:   This is a 32 yo RH woman with multiple conditions including Ehlers-Danlos syndrome, IgA nephropathy, Mast Cell Activation, POTS, chronic pain, fibromyalgia, ADHD, anxiety/depression/PTSD, and seizures. No GTCs since over a year ago, good response to Gabapentin 400mg  TID that she takes for pain. She however continues to report cognitive changes, body jerks and spasms, zoning out, and left tinnitus. MRI brain with and without contrast will be ordered to assess for underlying structural abnormality. There is lower likelihood these are seizure-related, however we may consider ambulatory EEG in the future after improvement in her other medical conditions (GI, pain). She is aware of [place] driving laws to stop driving after a seizure until 6 months seizure-free. Follow-up in 6 months, call for any changes.    Follow Up Instructions:   -I discussed the assessment and treatment plan with the patient. The patient was provided an opportunity to ask questions and all were answered. The patient agreed with the  plan and demonstrated an understanding of the instructions.   The patient was advised to call back or seek an in-person evaluation if the symptoms worsen or if the condition fails to improve as anticipated.     Van Clines, MD  CC: Dr. Rosanne Sack, Dr. Katrinka Blazing

## 2023-05-06 NOTE — Addendum Note (Signed)
Addended by: Dimas Chyle on: 05/06/2023 01:35 PM   Modules accepted: Orders

## 2023-05-06 NOTE — Patient Instructions (Signed)
I hope you feel better soon!  We will try again with MRI brain with and without contrast  2. Continue all your medications and follow-up with your other providers  3. Follow-up in 6 months, call for any changes.    Seizure Precautions: 1. If medication has been prescribed for you to prevent seizures, take it exactly as directed.  Do not stop taking the medicine without talking to your doctor first, even if you have not had a seizure in a long time.   2. Avoid activities in which a seizure would cause danger to yourself or to others.  Don't operate dangerous machinery, swim alone, or climb in high or dangerous places, such as on ladders, roofs, or girders.  Do not drive unless your doctor says you may.  3. If you have any warning that you may have a seizure, lay down in a safe place where you can't hurt yourself.    4.  No driving for 6 months from last seizure, as per [facility].   Please refer to the following link on the Epilepsy Foundation of America's website for more information: http://www.epilepsyfoundation.org/answerplace/Social/driving/drivingu.cfm   5.  Maintain good sleep hygiene. Avoid alcohol.  6.  Notify your neurology if you are planning pregnancy or if you become pregnant.  7.  Contact your doctor if you have any problems that may be related to the medicine you are taking.  8.  Call 911 and bring the patient back to the ED if:        A.  The seizure lasts longer than 5 minutes.       B.  The patient doesn't awaken shortly after the seizure  C.  The patient has new problems such as difficulty seeing, speaking or moving  D.  The patient was injured during the seizure  E.  The patient has a temperature over 102 F (39C)  F.  The patient vomited and now is having trouble breathing

## 2023-05-07 ENCOUNTER — Ambulatory Visit: Payer: Medicaid Other | Admitting: Allergy

## 2023-05-14 ENCOUNTER — Other Ambulatory Visit: Payer: Self-pay | Admitting: Physical Medicine & Rehabilitation

## 2023-05-14 IMAGING — US US RENAL
1 series · 16 of 25 positions shown · non-contrast
Comparison: None.

CLINICAL DATA: Back pain in pregnancy

EXAM:
RENAL / URINARY TRACT ULTRASOUND COMPLETE

[Series 1: us renal · 69 acquisitions, 16 frames shown]
[im 1/69]
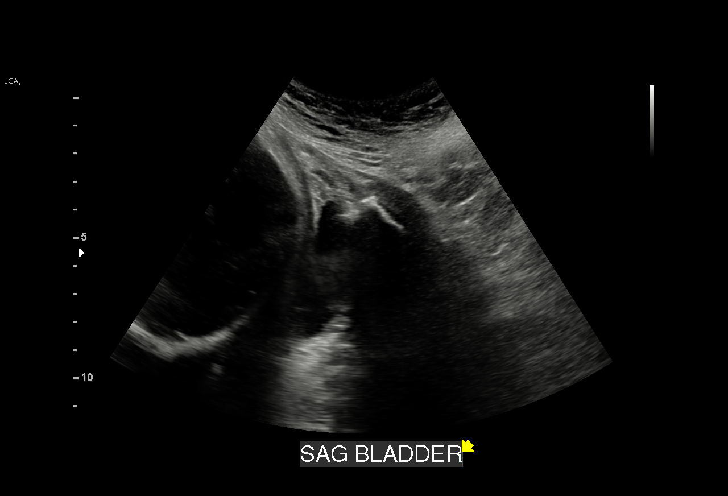
[im 6/69]
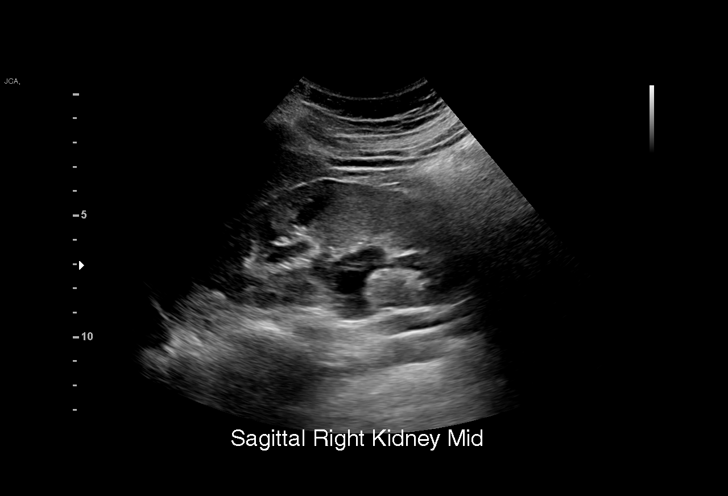
[im 9/69]
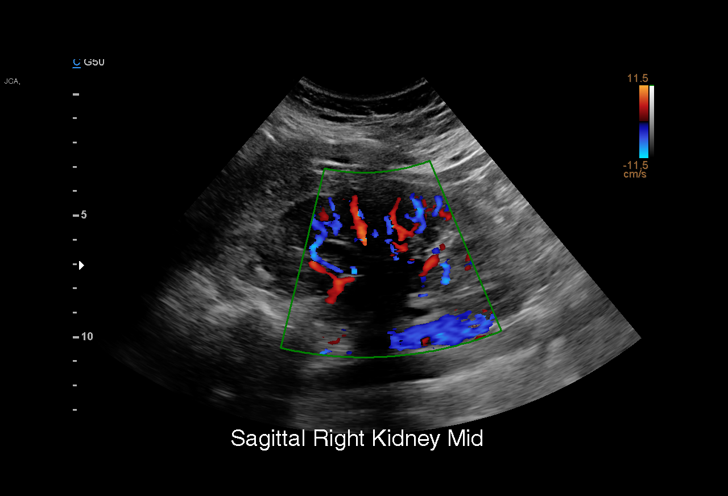
[im 15/69]
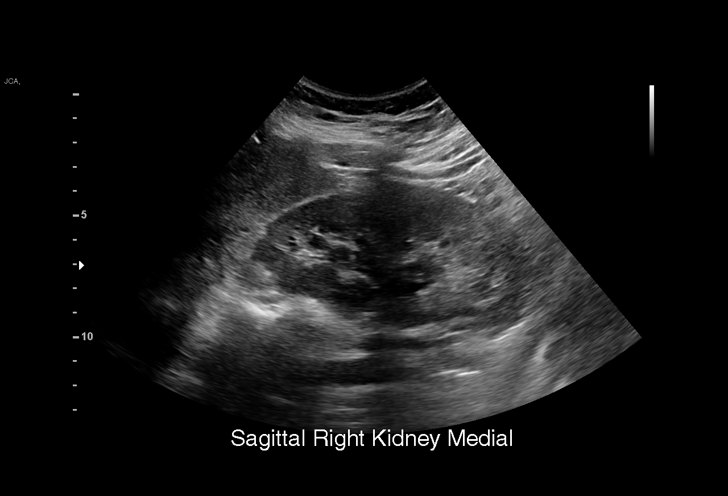
[im 20/69]
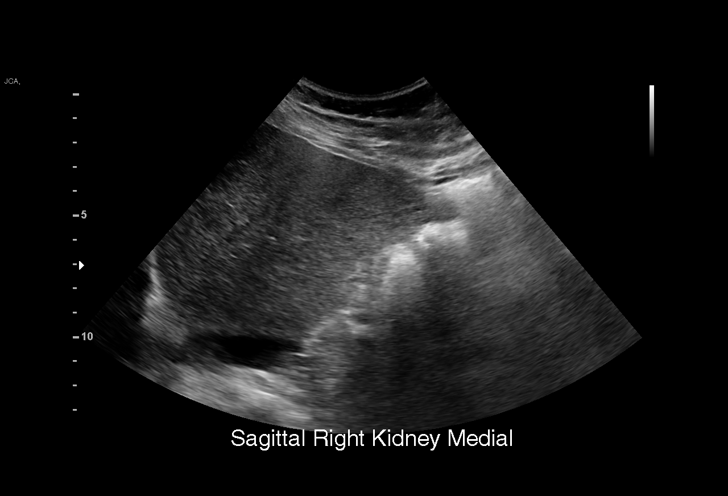
[im 23/69]
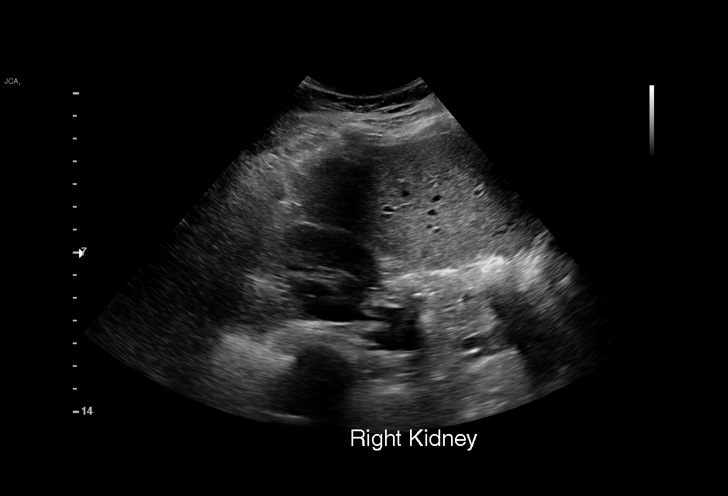
[im 29/69]
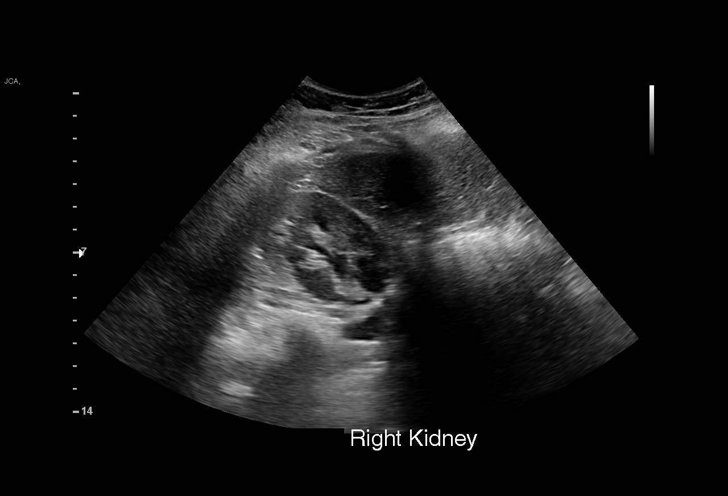
[im 32/69]
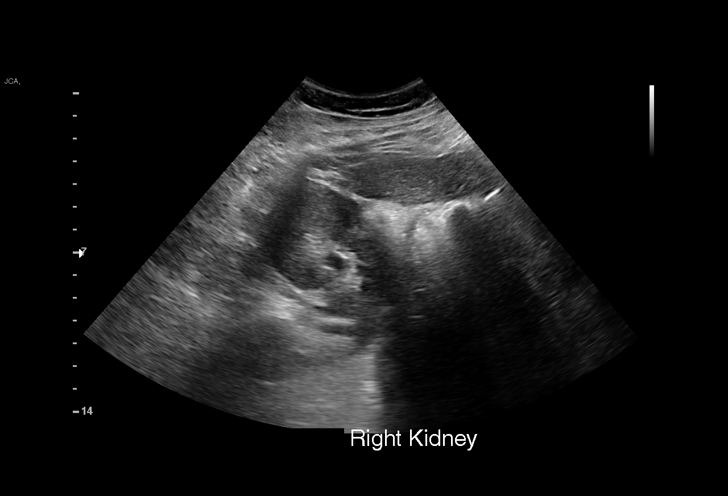
[im 37/69]
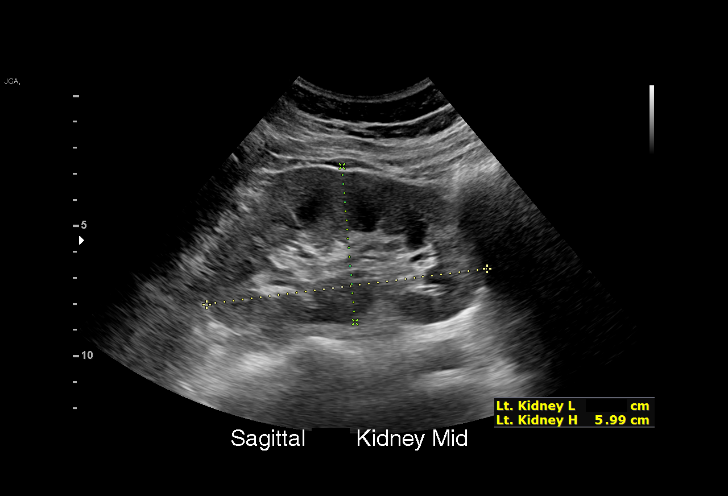
[im 40/69]
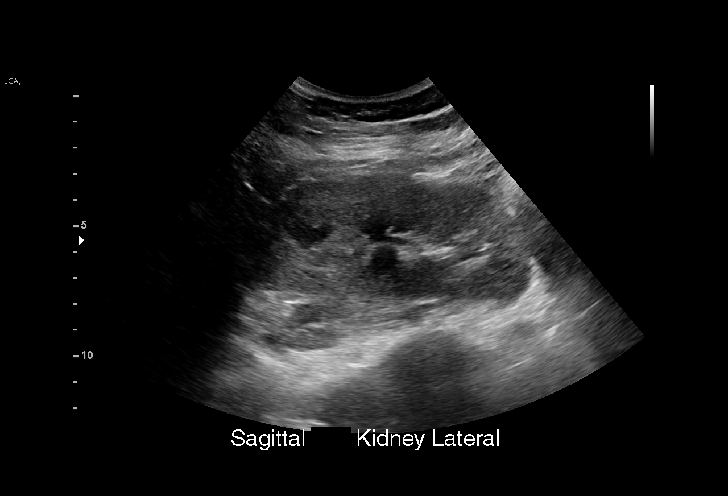
[im 46/69]
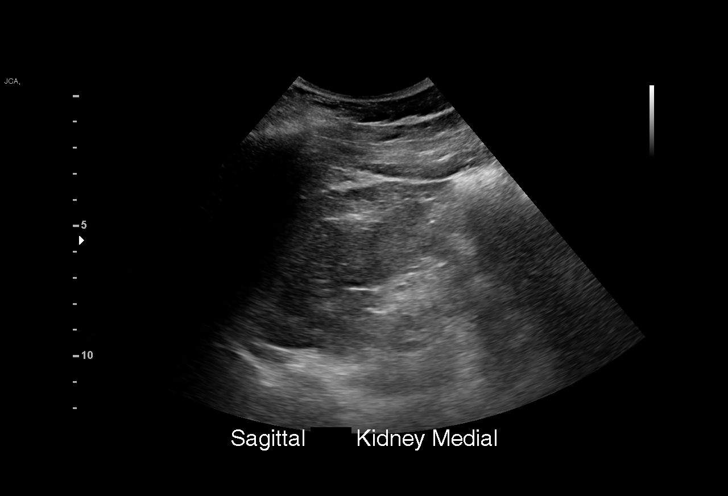
[im 49/69]
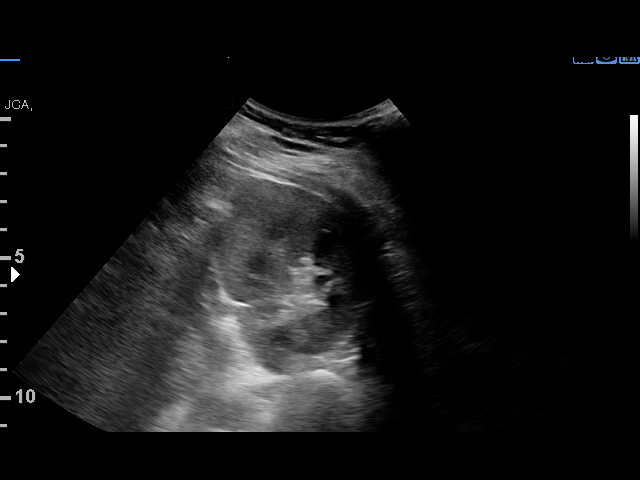
[im 54/69]
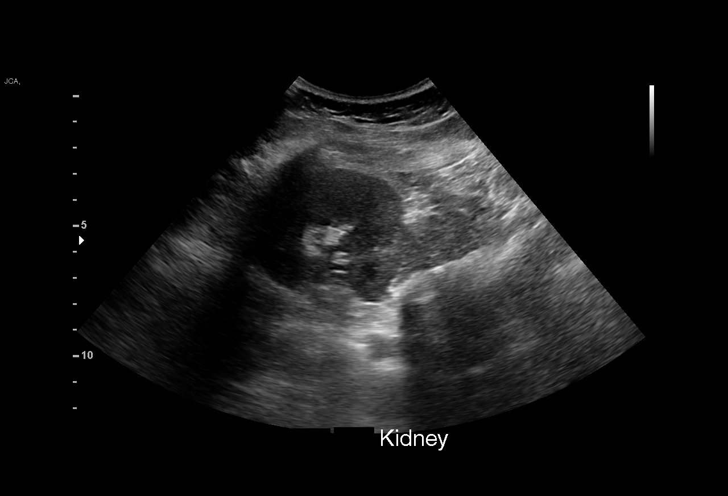
[im 60/69]
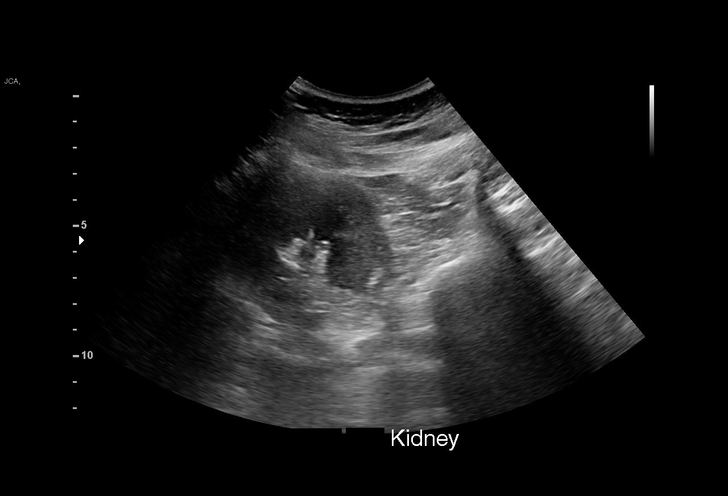
[im 63/69]
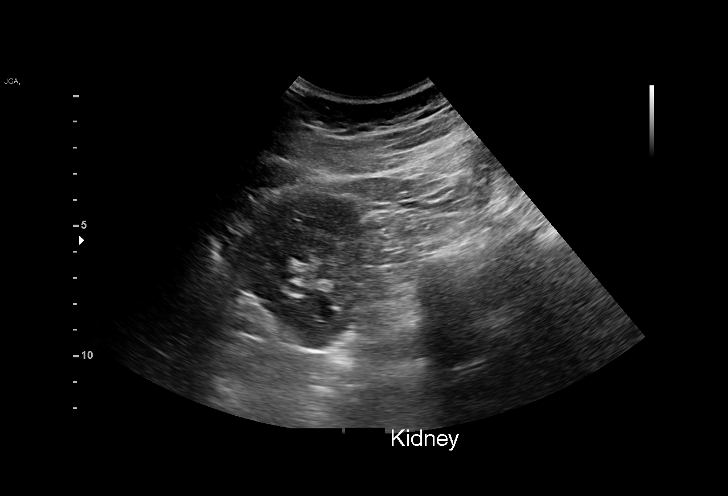
[im 69/69]
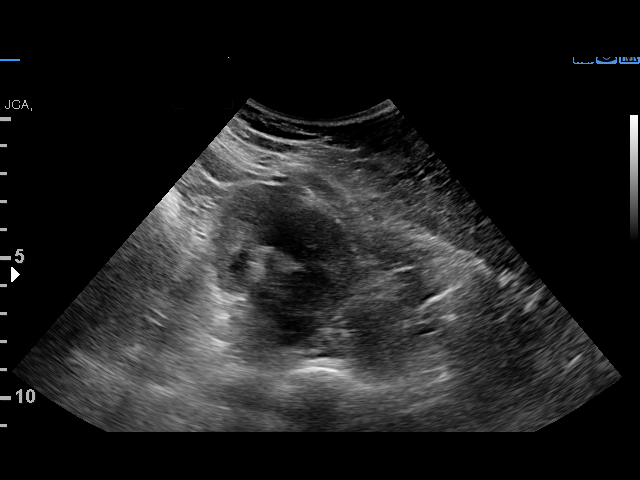

[16 of 25 positions shown; findings below may reference images not displayed]

FINDINGS: Right Kidney:

Renal measurements: 11.8 x 4.8 x 4.3 cm = volume: 126.1 mL.
Echogenicity within normal limits. No mass or hydronephrosis
visualized.

Left Kidney:

Renal measurements: 10.8 x 6.0 x 5.0 cm = volume: 169.8 mL. Left
upper pole renal cyst measures 1.9 x 0.8 cm. No hydronephrosis solid
renal mass.

Bladder:

Appears normal for degree of bladder distention.

Other:

None.
IMPRESSION: No hydronephrosis or other acute renal abnormality.

## 2023-05-25 ENCOUNTER — Other Ambulatory Visit: Payer: Medicaid Other

## 2023-05-27 ENCOUNTER — Telehealth: Payer: Self-pay | Admitting: *Deleted

## 2023-05-27 NOTE — Telephone Encounter (Signed)
[facility] is attempting to request a refill on the naltrexone. It cannot process through epic because it is not in her med list. When ordering naltrexone it needs to be entered as "non formulary compounded mediction" even though it still will need to be called to the pharmacy.

## 2023-05-29 ENCOUNTER — Other Ambulatory Visit: Payer: Self-pay | Admitting: Family Medicine

## 2023-06-01 ENCOUNTER — Encounter (HOSPITAL_COMMUNITY): Payer: Self-pay | Admitting: Obstetrics & Gynecology

## 2023-06-01 ENCOUNTER — Inpatient Hospital Stay (HOSPITAL_COMMUNITY)
Admission: AD | Admit: 2023-06-01 | Discharge: 2023-06-02 | Disposition: A | Discharge status: Home / Self Care | Payer: Medicaid Other | Attending: Obstetrics & Gynecology | Admitting: Obstetrics & Gynecology

## 2023-06-01 ENCOUNTER — Inpatient Hospital Stay (HOSPITAL_COMMUNITY): Payer: Medicaid Other

## 2023-06-01 DIAGNOSIS — G40909 Epilepsy, unspecified, not intractable, without status epilepticus: Secondary | ICD-10-CM | POA: Insufficient documentation

## 2023-06-01 DIAGNOSIS — O3480 Maternal care for other abnormalities of pelvic organs, unspecified trimester: Secondary | ICD-10-CM | POA: Insufficient documentation

## 2023-06-01 DIAGNOSIS — M797 Fibromyalgia: Secondary | ICD-10-CM | POA: Diagnosis not present

## 2023-06-01 DIAGNOSIS — M25519 Pain in unspecified shoulder: Secondary | ICD-10-CM | POA: Insufficient documentation

## 2023-06-01 DIAGNOSIS — O99351 Diseases of the nervous system complicating pregnancy, first trimester: Secondary | ICD-10-CM | POA: Diagnosis not present

## 2023-06-01 DIAGNOSIS — Z3A01 Less than 8 weeks gestation of pregnancy: Secondary | ICD-10-CM | POA: Diagnosis not present

## 2023-06-01 DIAGNOSIS — G8929 Other chronic pain: Secondary | ICD-10-CM | POA: Insufficient documentation

## 2023-06-01 DIAGNOSIS — R509 Fever, unspecified: Secondary | ICD-10-CM | POA: Diagnosis not present

## 2023-06-01 DIAGNOSIS — O26891 Other specified pregnancy related conditions, first trimester: Secondary | ICD-10-CM | POA: Diagnosis present

## 2023-06-01 DIAGNOSIS — R5383 Other fatigue: Secondary | ICD-10-CM

## 2023-06-01 DIAGNOSIS — O3680X0 Pregnancy with inconclusive fetal viability, not applicable or unspecified: Secondary | ICD-10-CM | POA: Insufficient documentation

## 2023-06-01 DIAGNOSIS — N83201 Unspecified ovarian cyst, right side: Secondary | ICD-10-CM | POA: Insufficient documentation

## 2023-06-01 DIAGNOSIS — R1032 Left lower quadrant pain: Secondary | ICD-10-CM | POA: Diagnosis not present

## 2023-06-01 LAB — HCG, QUANTITATIVE, PREGNANCY: hCG, Beta Chain, Quant, S: 1599 m[IU]/mL — ABNORMAL HIGH (ref <5)

## 2023-06-01 MED ORDER — MONTELUKAST SODIUM 10 MG PO TABS: 10.0000 mg | ORAL_TABLET | Freq: Every day | ORAL | 0 refills | Status: DC

## 2023-06-01 NOTE — MAU Provider Note (Incomplete Revision)
History     [id number]  Arrival date and time: 06/01/23 1833    Chief Complaint  Patient presents with   Abdominal Pain   Shoulder Pain   Fever   sniffles   Fatigue     HPI Angela Woodard is a 32 y.o. at [redacted]w[redacted]d by sure LMP with PMHx notable for Carylon Perches Danlos Syndrome, celiac, fibromyalgia, seizure disorder, IgA nephropathy, who presents for concern for sinus infection and LLQ abdominal pain.   Patient reports sure LMP giving GA of [redacted] weeks Has had some cramping for the past few days, L>R, comes and goes Has not had any vaginal bleeding Worried about the possibility of an ectopic pregnancy given her numerous health issues Acknowledges chronic pain in multiple locations but LLQ pain is distinct from her usual pain  Also wondering if she has a sinus infection Has been having congested/runny nose and a post nasal drip All of her kids have been sick over the past few weeks She had a fever yesterday, with temp just below 101F, though none now No chest pain or SOB currently No cough      OB History     Gravida  4   Para  1   Term  1   Preterm      AB  1   Living  1      SAB  1   IAB      Ectopic      Multiple      Live Births              Past Medical History:  Diagnosis Date   Arthritis    Asthma    Celiac disease    Depression    Ehlers-Danlos, hypermobile type    Normal echo 2021   Fibromyalgia    History of kidney stones    Mast cell activation syndrome (HCC)    Migraine    Nephropathy    OCD (obsessive compulsive disorder)    POTS (postural orthostatic tachycardia syndrome)    Pruritus    Normal bile acids 06/06/2021   Seizure disorder (HCC)    Last reported event 05/15/2021- during pt sleep   Urticaria     Past Surgical History:  Procedure Laterality Date   ADENOIDECTOMY     TYMPANOSTOMY     x3   TYMPANOSTOMY TUBE PLACEMENT      Family History  Problem Relation Age of Onset   Allergic rhinitis Mother    Hypertension  Mother    Allergic rhinitis Father    Throat cancer Father    Melanoma Father    Urticaria Sister    Allergic rhinitis Sister    Anxiety disorder Sister    Memory loss Maternal Grandmother    Colon cancer Maternal Grandmother    Heart disease Paternal Grandfather    Lung cancer Paternal Grandfather    Obesity Paternal Grandfather    Diabetes Paternal Grandfather    Seizures Neg Hx     Social History   Socioeconomic History   Marital status: Married    Spouse name: Psychologist, clinical   Number of children: 1   Years of education: some colle   Highest education level: Not on file  Occupational History   Occupation: Engineer, water at [employer]  Tobacco Use   Smoking status: Never    Passive exposure: Never   Smokeless tobacco: Never  Vaping Use   Vaping status: Former   Substances: CBD  Substance and Sexual Activity  Alcohol use: Not Currently   Drug use: No   Sexual activity: Yes    Birth control/protection: Condom  Other Topics Concern   Not on file  Social History Narrative   Lives with husband and 2 adopted special needs children and 41 month old   Caffeine use: 1 serving daily   Right handed    Social Determinants of Health   Financial Resource Strain: Low Risk  (05/28/2023)   Received from [facility]   Overall Financial Resource Strain (CARDIA)    Difficulty of Paying Living Expenses: Not very hard  Food Insecurity: No Food Insecurity (05/28/2023)   Received from [facility]   Hunger Vital Sign    Worried About Running Out of Food in the Last Year: Never true    Ran Out of Food in the Last Year: Never true  Transportation Needs: No Transportation Needs (05/28/2023)   Received from [facility]   PRAPARE - Transportation    Lack of Transportation (Medical): No    Lack of Transportation (Non-Medical): No  Physical Activity: Not on file  Stress: Not on file  Social Connections: Unknown (02/02/2023)   [facility]    Social Network    Social Network: Not on file  Intimate Partner Violence: Not At Risk (02/02/2023)   Received from [facility]   HITS    Over the last 12 months how often did your partner physically hurt you?: Never    Over the last 12 months how often did your partner insult you or talk down to you?: Never    Over the last 12 months how often did your partner threaten you with physical harm?: Never    Over the last 12 months how often did your partner scream or curse at you?: Never    Allergies  Allergen Reactions   Relpax [Eletriptan] Anaphylaxis    Throat swelling   Sulfa Antibiotics Hives and Rash   Gluten Meal    Latex Hives   Lamotrigine Rash    No current facility-administered medications on file prior to encounter.   Current Outpatient Medications on File Prior to Encounter  Medication Sig Dispense Refill   albuterol (VENTOLIN HFA) 108 (90 Base) MCG/ACT inhaler Inhale 2 puffs into the lungs every 6 (six) hours as needed for wheezing or shortness of breath. 8 g 2   amphetamine-dextroamphetamine (ADDERALL) 20 MG tablet Take 20 mg by mouth 2 (two) times daily. 20mg  in the morning and 10mg  at night     cetirizine (ZYRTEC) 10 MG tablet Take 30 mg by mouth daily.     cromolyn (GASTROCROM) 100 MG/5ML solution Take 5 mLs (100 mg total) by mouth 3 (three) times daily before meals. 480 mL 5   cyclobenzaprine (FLEXERIL) 5 MG tablet TAKE 1 TABLET(5 MG) BY MOUTH AT BEDTIME AS NEEDED FOR MUSCLE SPASMS 30 tablet 3   EPINEPHrine 0.3 mg/0.3 mL IJ SOAJ injection Inject 0.3 mg into the muscle as needed for anaphylaxis. 1 each 2   fluticasone (FLONASE) 50 MCG/ACT nasal spray Place 2 sprays into both nostrils daily. 16 g 0   gabapentin (NEURONTIN) 400 MG capsule TAKE 1 CAPSULE BY MOUTH 3 TIMES DAILY. 270 capsule 1   midodrine (PROAMATINE) 2.5 MG tablet Take 2.5 mg by mouth 3 (three) times daily with meals. As needed for hypotension     montelukast (SINGULAIR) 10 MG tablet Take 1 tablet (10 mg  total) by mouth at bedtime. 90 tablet 0   naproxen (NAPROSYN)  500 MG tablet Take 1 tablet (500 mg total) by mouth 2 (two) times daily with a meal. 30 tablet 0   NONFORMULARY OR COMPOUNDED ITEM Take 1-2 mg by mouth daily. Naltrexone 100 each 1   propranolol (INDERAL) 10 MG tablet Take 10 mg by mouth 3 (three) times daily.     UNABLE TO FIND Take 2 capsules by mouth daily. Med Name: Zyflamend, 2 capsules once daily per patient (Patient not taking: Reported on 05/06/2023)       ROS Pertinent positives and negative per HPI, all others reviewed and negative  Physical Exam   BP 104/72 (BP Location: Right Arm)   Pulse 95   Temp 98 F (36.7 C) (Oral)   Resp 16   Ht 5\' 4"  (1.626 m)   Wt 69.8 kg   LMP 05/02/2023 (Approximate)   SpO2 99%   BMI 26.42 kg/m   Patient Vitals for the past 24 hrs:  BP Temp Temp src Pulse Resp SpO2 Height Weight  06/01/23 1851 104/72 98 F (36.7 C) Oral 95 16 99 % 5\' 4"  (1.626 m) 69.8 kg    Physical Exam Vitals reviewed.  Constitutional:      General: She is not in acute distress.    Appearance: She is well-developed. She is not diaphoretic.  Eyes:     General: No scleral icterus. Pulmonary:     Effort: Pulmonary effort is normal. No respiratory distress.     Breath sounds: Normal breath sounds. No stridor. No wheezing, rhonchi or rales.  Abdominal:     General: Abdomen is flat. Bowel sounds are normal. There is no distension.     Palpations: Abdomen is soft. There is no mass.     Tenderness: There is abdominal tenderness in the left lower quadrant. There is no guarding or rebound.  Skin:    General: Skin is warm and dry.  Neurological:     Mental Status: She is alert.     Coordination: Coordination normal.      Cervical Exam    Bedside Ultrasound Pt informed that the ultrasound is considered a limited OB ultrasound and is not intended to be a complete ultrasound exam.  Patient also informed that the ultrasound is not being completed with the  intent of assessing for fetal or placental anomalies or any pelvic abnormalities.  Explained that the purpose of today's ultrasound is to assess for  viability.  Patient acknowledges the purpose of the exam and the limitations of the study.      My interpretation: First trimester findings: Intrauterine gestational sac seen: likely IUGS seen though not well visualized   Labs No results found for this or any previous visit (from the past 24 hour(s)).  Imaging No results found.  MAU Course  Procedures  Lab Orders         hCG, quantitative, pregnancy    No orders of the defined types were placed in this encounter.   Imaging Orders         US OB LESS THAN 14 WEEKS WITH OB TRANSVAGINAL     MDM Moderate (Level 3-4)  Assessment and Plan  # Viral illness #[redacted] weeks gestation of pregnancy Presentation most c/w viral illness, discussed antibiotics not indicated. No signs of acute re-worsening or localized pain to suggest acute sinusitus. No cough/other symptoms or focal lung findings to suggest pneumonia. Recommend conservative treatment and rest.  #LLQ pain Likely gestational sac visualized on [place] but I am not certain given early stage and [place]  quality. Recommend hcg check and formal US. Signed out to oncoming provider at change of shift.   Dispo:  pending [place]     Venora Maples, MD/MPH 06/01/23 8:36 PM

## 2023-06-01 NOTE — MAU Note (Signed)
Angela Woodard is a 32 y.o. at [redacted]w[redacted]d here in MAU reporting: whole family has been for 2 wks. They all have walking pneumonia.  She doesn't have cough or congestion, no sore throat,  only has a little sniffle, has a fever (highest 100.6), has been having chills and sweats.  Has had some cramping, though none today. Is really really fatigued today. Feeling bloated today, dull pain on LLQ.  Has had some shoulder pain. No bleeding. LMP: 10/26 Onset of complaint: ongoing Pain score: LLQ 4/shoulder severe(has chronic pain issues, is always is pain) Vitals:   06/01/23 1851  BP: 104/72  Pulse: 95  Resp: 16  Temp: 98 F (36.7 C)  SpO2: 99%      Lab orders placed from triage:

## 2023-06-01 NOTE — MAU Provider Note (Cosign Needed)
History     [id number]  Arrival date and time: 06/01/23 1833    Chief Complaint  Patient presents with   Abdominal Pain   Shoulder Pain   Fever   sniffles   Fatigue     HPI Angela Woodard is a 32 y.o. at [redacted]w[redacted]d by sure LMP with PMHx notable for Carylon Perches Danlos Syndrome, celiac, fibromyalgia, seizure disorder, IgA nephropathy, who presents for concern for sinus infection and LLQ abdominal pain.   Patient reports sure LMP giving GA of [redacted] weeks Has had some cramping for the past few days, L>R, comes and goes Has not had any vaginal bleeding Worried about the possibility of an ectopic pregnancy given her numerous health issues Acknowledges chronic pain in multiple locations but LLQ pain is distinct from her usual pain  Also wondering if she has a sinus infection Has been having congested/runny nose and a post nasal drip All of her kids have been sick over the past few weeks She had a fever yesterday, with temp just below 101F, though none now No chest pain or SOB currently No cough      OB History     Gravida  4   Para  1   Term  1   Preterm      AB  1   Living  1      SAB  1   IAB      Ectopic      Multiple      Live Births              Past Medical History:  Diagnosis Date   Arthritis    Asthma    Celiac disease    Depression    Ehlers-Danlos, hypermobile type    Normal echo 2021   Fibromyalgia    History of kidney stones    Mast cell activation syndrome (HCC)    Migraine    Nephropathy    OCD (obsessive compulsive disorder)    POTS (postural orthostatic tachycardia syndrome)    Pruritus    Normal bile acids 06/06/2021   Seizure disorder (HCC)    Last reported event 05/15/2021- during pt sleep   Urticaria     Past Surgical History:  Procedure Laterality Date   ADENOIDECTOMY     TYMPANOSTOMY     x3   TYMPANOSTOMY TUBE PLACEMENT      Family History  Problem Relation Age of Onset   Allergic rhinitis Mother    Hypertension  Mother    Allergic rhinitis Father    Throat cancer Father    Melanoma Father    Urticaria Sister    Allergic rhinitis Sister    Anxiety disorder Sister    Memory loss Maternal Grandmother    Colon cancer Maternal Grandmother    Heart disease Paternal Grandfather    Lung cancer Paternal Grandfather    Obesity Paternal Grandfather    Diabetes Paternal Grandfather    Seizures Neg Hx     Social History   Socioeconomic History   Marital status: Married    Spouse name: Psychologist, clinical   Number of children: 1   Years of education: some colle   Highest education level: Not on file  Occupational History   Occupation: Engineer, water at [employer]  Tobacco Use   Smoking status: Never    Passive exposure: Never   Smokeless tobacco: Never  Vaping Use   Vaping status: Former   Substances: CBD  Substance and Sexual Activity  Alcohol use: Not Currently   Drug use: No   Sexual activity: Yes    Birth control/protection: Condom  Other Topics Concern   Not on file  Social History Narrative   Lives with husband and 2 adopted special needs children and 41 month old   Caffeine use: 1 serving daily   Right handed    Social Determinants of Health   Financial Resource Strain: Low Risk  (05/28/2023)   Received from [facility]   Overall Financial Resource Strain (CARDIA)    Difficulty of Paying Living Expenses: Not very hard  Food Insecurity: No Food Insecurity (05/28/2023)   Received from [facility]   Hunger Vital Sign    Worried About Running Out of Food in the Last Year: Never true    Ran Out of Food in the Last Year: Never true  Transportation Needs: No Transportation Needs (05/28/2023)   Received from [facility]   PRAPARE - Transportation    Lack of Transportation (Medical): No    Lack of Transportation (Non-Medical): No  Physical Activity: Not on file  Stress: Not on file  Social Connections: Unknown (02/02/2023)   Received from [facility]    Social Network    Social Network: Not on file  Intimate Partner Violence: Not At Risk (02/02/2023)   Received from [facility]   HITS    Over the last 12 months how often did your partner physically hurt you?: Never    Over the last 12 months how often did your partner insult you or talk down to you?: Never    Over the last 12 months how often did your partner threaten you with physical harm?: Never    Over the last 12 months how often did your partner scream or curse at you?: Never    Allergies  Allergen Reactions   Relpax [Eletriptan] Anaphylaxis    Throat swelling   Sulfa Antibiotics Hives and Rash   Gluten Meal    Latex Hives   Lamotrigine Rash    No current facility-administered medications on file prior to encounter.   Current Outpatient Medications on File Prior to Encounter  Medication Sig Dispense Refill   albuterol (VENTOLIN HFA) 108 (90 Base) MCG/ACT inhaler Inhale 2 puffs into the lungs every 6 (six) hours as needed for wheezing or shortness of breath. 8 g 2   amphetamine-dextroamphetamine (ADDERALL) 20 MG tablet Take 20 mg by mouth 2 (two) times daily. 20mg  in the morning and 10mg  at night     cetirizine (ZYRTEC) 10 MG tablet Take 30 mg by mouth daily.     cromolyn (GASTROCROM) 100 MG/5ML solution Take 5 mLs (100 mg total) by mouth 3 (three) times daily before meals. 480 mL 5   cyclobenzaprine (FLEXERIL) 5 MG tablet TAKE 1 TABLET(5 MG) BY MOUTH AT BEDTIME AS NEEDED FOR MUSCLE SPASMS 30 tablet 3   EPINEPHrine 0.3 mg/0.3 mL IJ SOAJ injection Inject 0.3 mg into the muscle as needed for anaphylaxis. 1 each 2   fluticasone (FLONASE) 50 MCG/ACT nasal spray Place 2 sprays into both nostrils daily. 16 g 0   gabapentin (NEURONTIN) 400 MG capsule TAKE 1 CAPSULE BY MOUTH 3 TIMES DAILY. 270 capsule 1   midodrine (PROAMATINE) 2.5 MG tablet Take 2.5 mg by mouth 3 (three) times daily with meals. As needed for hypotension     montelukast (SINGULAIR) 10 MG tablet Take 1 tablet (10 mg  total) by mouth at bedtime. 90 tablet 0   naproxen (NAPROSYN)  500 MG tablet Take 1 tablet (500 mg total) by mouth 2 (two) times daily with a meal. 30 tablet 0   NONFORMULARY OR COMPOUNDED ITEM Take 1-2 mg by mouth daily. Naltrexone 100 each 1   propranolol (INDERAL) 10 MG tablet Take 10 mg by mouth 3 (three) times daily.     UNABLE TO FIND Take 2 capsules by mouth daily. Med Name: Zyflamend, 2 capsules once daily per patient (Patient not taking: Reported on 05/06/2023)       ROS Pertinent positives and negative per HPI, all others reviewed and negative  Physical Exam   BP 104/72 (BP Location: Right Arm)   Pulse 95   Temp 98 F (36.7 C) (Oral)   Resp 16   Ht 5\' 4"  (1.626 m)   Wt 69.8 kg   LMP 05/02/2023 (Approximate)   SpO2 99%   BMI 26.42 kg/m   Patient Vitals for the past 24 hrs:  BP Temp Temp src Pulse Resp SpO2 Height Weight  06/01/23 1851 104/72 98 F (36.7 C) Oral 95 16 99 % 5\' 4"  (1.626 m) 69.8 kg    Physical Exam Vitals reviewed.  Constitutional:      General: She is not in acute distress.    Appearance: She is well-developed. She is not diaphoretic.  Eyes:     General: No scleral icterus. Pulmonary:     Effort: Pulmonary effort is normal. No respiratory distress.     Breath sounds: Normal breath sounds. No stridor. No wheezing, rhonchi or rales.  Abdominal:     General: Abdomen is flat. Bowel sounds are normal. There is no distension.     Palpations: Abdomen is soft. There is no mass.     Tenderness: There is abdominal tenderness in the left lower quadrant. There is no guarding or rebound.  Skin:    General: Skin is warm and dry.  Neurological:     Mental Status: She is alert.     Coordination: Coordination normal.      Cervical Exam    Bedside Ultrasound Pt informed that the ultrasound is considered a limited OB ultrasound and is not intended to be a complete ultrasound exam.  Patient also informed that the ultrasound is not being completed with the  intent of assessing for fetal or placental anomalies or any pelvic abnormalities.  Explained that the purpose of today's ultrasound is to assess for  viability.  Patient acknowledges the purpose of the exam and the limitations of the study.      My interpretation: First trimester findings: Intrauterine gestational sac seen: likely IUGS seen though not well visualized   Labs No results found for this or any previous visit (from the past 24 hour(s)).  Imaging No results found.  MAU Course  Procedures  Lab Orders         hCG, quantitative, pregnancy    No orders of the defined types were placed in this encounter.   Imaging Orders         US OB LESS THAN 14 WEEKS WITH OB TRANSVAGINAL     MDM Moderate (Level 3-4)  Assessment and Plan  # Viral illness #[redacted] weeks gestation of pregnancy Presentation most c/w viral illness, discussed antibiotics not indicated. No signs of acute re-worsening or localized pain to suggest acute sinusitus. No cough/other symptoms or focal lung findings to suggest pneumonia. Recommend conservative treatment and rest.  #LLQ pain Likely gestational sac visualized on [place] but I am not certain given early stage and [place]  quality. Recommend hcg check and formal US. Signed out to oncoming provider at change of shift.   Dispo:  pending [place]     Venora Maples, MD/MPH 06/01/23 8:36 PM  US OB LESS THAN 14 WEEKS WITH OB TRANSVAGINAL  Result Date: 06/01/2023 CLINICAL DATA:  Left lower quadrant pain. Beta HCG unknown. LMP 05/02/2023 EXAM: OBSTETRIC <14 WK [place] AND TRANSVAGINAL OB US TECHNIQUE: Both transabdominal and transvaginal ultrasound examinations were performed for complete evaluation of the gestation as well as the maternal uterus, adnexal regions, and pelvic cul-de-sac. Transvaginal technique was performed to assess early pregnancy. COMPARISON:  None Available. FINDINGS: Intrauterine gestational sac: Single Yolk sac:  Not Visualized. Embryo:  Not Visualized.  Cardiac Activity: Not Visualized. MSD: 3.4 mm   5 w   0 d Subchorionic hemorrhage:  None visualized. Maternal uterus/adnexae: 3.1 cm corpus complex cyst in the right ovary with peripheral vascularity and internal layering echogenic debris. Unremarkable left ovary. No free fluid. IMPRESSION: 1. Probable early intrauterine gestational sac, but no yolk sac, fetal pole, or cardiac activity yet visualized. Recommend follow-up quantitative B-HCG levels and follow-up US in 14 days to assess viability. This recommendation follows SRU consensus guidelines: Diagnostic Criteria for Nonviable Pregnancy Early in the First Trimester. Malva Limes Med 2013; 865:7846-96. 2. Presumed hemorrhagic corpus luteum cyst in the right ovary. Electronically Signed   By: Minerva Fester M.D.   On: 06/01/2023 22:20    Discussed findings with patient  A:  Pregnancy at 5weeks       Fever       Fatigue       Shoulder pain       Pregnancy unknown location, now appears to be intrauterine  P:   Discharge home        Supportive care for URI        Routine prenatal care       Encouraged to return if she develops worsening of symptoms, increase in pain, fever, or other concerning symptoms.   Aviva Signs, CNM

## 2023-06-02 DIAGNOSIS — M25519 Pain in unspecified shoulder: Secondary | ICD-10-CM

## 2023-06-02 DIAGNOSIS — O3680X0 Pregnancy with inconclusive fetal viability, not applicable or unspecified: Secondary | ICD-10-CM

## 2023-06-02 DIAGNOSIS — Z3A01 Less than 8 weeks gestation of pregnancy: Secondary | ICD-10-CM

## 2023-06-02 DIAGNOSIS — R5383 Other fatigue: Secondary | ICD-10-CM

## 2023-06-02 DIAGNOSIS — R509 Fever, unspecified: Secondary | ICD-10-CM

## 2023-06-03 MED ORDER — NONFORMULARY OR COMPOUNDED ITEM: 1.0000 mg | Freq: Every day | 1 refills | Status: DC

## 2023-06-06 ENCOUNTER — Encounter: Payer: Self-pay | Admitting: Physician Assistant

## 2023-06-06 ENCOUNTER — Telehealth: Payer: Medicaid Other

## 2023-06-07 ENCOUNTER — Inpatient Hospital Stay (HOSPITAL_COMMUNITY): Payer: Medicaid Other

## 2023-06-07 ENCOUNTER — Encounter (HOSPITAL_COMMUNITY): Payer: Self-pay | Admitting: Obstetrics and Gynecology

## 2023-06-07 ENCOUNTER — Other Ambulatory Visit: Payer: Self-pay

## 2023-06-07 ENCOUNTER — Inpatient Hospital Stay (HOSPITAL_COMMUNITY)
Admission: AD | Admit: 2023-06-07 | Discharge: 2023-06-07 | Disposition: A | Discharge status: Home / Self Care | Payer: Medicaid Other

## 2023-06-07 DIAGNOSIS — M797 Fibromyalgia: Secondary | ICD-10-CM | POA: Insufficient documentation

## 2023-06-07 DIAGNOSIS — Z6826 Body mass index (BMI) 26.0-26.9, adult: Secondary | ICD-10-CM | POA: Insufficient documentation

## 2023-06-07 DIAGNOSIS — R634 Abnormal weight loss: Secondary | ICD-10-CM | POA: Diagnosis not present

## 2023-06-07 DIAGNOSIS — O99511 Diseases of the respiratory system complicating pregnancy, first trimester: Secondary | ICD-10-CM | POA: Insufficient documentation

## 2023-06-07 DIAGNOSIS — O99891 Other specified diseases and conditions complicating pregnancy: Secondary | ICD-10-CM | POA: Diagnosis not present

## 2023-06-07 DIAGNOSIS — D8940 Mast cell activation, unspecified: Secondary | ICD-10-CM | POA: Insufficient documentation

## 2023-06-07 DIAGNOSIS — O26891 Other specified pregnancy related conditions, first trimester: Secondary | ICD-10-CM | POA: Insufficient documentation

## 2023-06-07 DIAGNOSIS — G90A Postural orthostatic tachycardia syndrome (POTS): Secondary | ICD-10-CM | POA: Insufficient documentation

## 2023-06-07 DIAGNOSIS — R1084 Generalized abdominal pain: Secondary | ICD-10-CM | POA: Diagnosis not present

## 2023-06-07 DIAGNOSIS — G40909 Epilepsy, unspecified, not intractable, without status epilepticus: Secondary | ICD-10-CM | POA: Diagnosis not present

## 2023-06-07 DIAGNOSIS — O99341 Other mental disorders complicating pregnancy, first trimester: Secondary | ICD-10-CM | POA: Insufficient documentation

## 2023-06-07 DIAGNOSIS — O99351 Diseases of the nervous system complicating pregnancy, first trimester: Secondary | ICD-10-CM | POA: Insufficient documentation

## 2023-06-07 DIAGNOSIS — R0902 Hypoxemia: Secondary | ICD-10-CM | POA: Diagnosis present

## 2023-06-07 DIAGNOSIS — Z79899 Other long term (current) drug therapy: Secondary | ICD-10-CM | POA: Insufficient documentation

## 2023-06-07 DIAGNOSIS — J45909 Unspecified asthma, uncomplicated: Secondary | ICD-10-CM | POA: Insufficient documentation

## 2023-06-07 DIAGNOSIS — Z3A01 Less than 8 weeks gestation of pregnancy: Secondary | ICD-10-CM | POA: Insufficient documentation

## 2023-06-07 DIAGNOSIS — R0602 Shortness of breath: Secondary | ICD-10-CM

## 2023-06-07 DIAGNOSIS — R079 Chest pain, unspecified: Secondary | ICD-10-CM

## 2023-06-07 DIAGNOSIS — R0789 Other chest pain: Secondary | ICD-10-CM | POA: Insufficient documentation

## 2023-06-07 DIAGNOSIS — F429 Obsessive-compulsive disorder, unspecified: Secondary | ICD-10-CM | POA: Insufficient documentation

## 2023-06-07 DIAGNOSIS — H748X2 Other specified disorders of left middle ear and mastoid: Secondary | ICD-10-CM | POA: Diagnosis not present

## 2023-06-07 DIAGNOSIS — Q796 Ehlers-Danlos syndrome, unspecified: Secondary | ICD-10-CM

## 2023-06-07 DIAGNOSIS — R1033 Periumbilical pain: Secondary | ICD-10-CM | POA: Diagnosis not present

## 2023-06-07 DIAGNOSIS — K900 Celiac disease: Secondary | ICD-10-CM | POA: Diagnosis not present

## 2023-06-07 LAB — CBC
HCT: 37.3 % (ref 36.0–46.0)
Hemoglobin: 12.3 g/dL (ref 12.0–15.0)
MCH: 32 pg (ref 26.0–34.0)
MCHC: 33 g/dL (ref 30.0–36.0)
MCV: 97.1 fL (ref 80.0–100.0)
Platelets: 220 10*3/uL (ref 150–400)
RBC: 3.84 MIL/uL — ABNORMAL LOW (ref 3.87–5.11)
RDW: 12.5 % (ref 11.5–15.5)
WBC: 6.5 10*3/uL (ref 4.0–10.5)
nRBC: 0 % (ref 0.0–0.2)

## 2023-06-07 LAB — D-DIMER, QUANTITATIVE: D-Dimer, Quant: 0.77 ug{FEU}/mL — ABNORMAL HIGH (ref 0.00–0.50)

## 2023-06-07 LAB — BASIC METABOLIC PANEL
Anion gap: 8 (ref 5–15)
BUN: 7 mg/dL (ref 6–20)
CO2: 21 mmol/L — ABNORMAL LOW (ref 22–32)
Calcium: 8.8 mg/dL — ABNORMAL LOW (ref 8.9–10.3)
Chloride: 107 mmol/L (ref 98–111)
Creatinine, Ser: 0.58 mg/dL (ref 0.44–1.00)
GFR, Estimated: >60 mL/min (ref >60)
Glucose, Bld: 88 mg/dL (ref 70–99)
Potassium: 3.9 mmol/L (ref 3.5–5.1)
Sodium: 136 mmol/L (ref 135–145)

## 2023-06-07 LAB — TROPONIN I (HIGH SENSITIVITY): Troponin I (High Sensitivity): <2 ng/L (ref <18)

## 2023-06-07 LAB — BRAIN NATRIURETIC PEPTIDE: B Natriuretic Peptide: 11.7 pg/mL (ref 0.0–100.0)

## 2023-06-07 LAB — HCG, QUANTITATIVE, PREGNANCY: hCG, Beta Chain, Quant, S: 12308 m[IU]/mL — ABNORMAL HIGH (ref <5)

## 2023-06-07 MED ORDER — IOHEXOL 350 MG/ML SOLN
75.0000 mL | Freq: Once | INTRAVENOUS | Status: AC | PRN
  Administered 2023-06-07: 75 mL via INTRAVENOUS

## 2023-06-07 NOTE — Discharge Instructions (Signed)
Thank you for allowing [place] to be a part of your care today.  You were evaluated in the ED for chest pain.  Your CT scan was negative for a blood clot in your lungs.  It did mention a borderline enlarged heart, I recommend following up with your cardiologist regarding your chest pain and the findings on the CT.  A scheduler will reach out to you to schedule the ultrasound for your mesenteric artery.  This will be done on an outpatient basis.  I recommend following up with your gastroenterologist after this is done.  Continue taking all of your medications as prescribed.  For discomfort, I recommend taking Tylenol as this is safe to use during pregnancy.  Do not take medications such as ibuprofen or Aleve (naproxen).    Return to the ED if you develop sudden worsening of your symptoms or if you have any new concerns.

## 2023-06-07 NOTE — Progress Notes (Signed)
VASCULAR LAB    Mesenteric duplex ordered in the ED will be scheduled as an outpatient.  Patient will be called by the Vascular Lab administrative assistant to schedule.      Ladine Kiper, RVT 06/07/2023, 6:34 PM

## 2023-06-07 NOTE — MAU Provider Note (Signed)
History     CSN: [id number]  Arrival date and time: 06/07/23 1119   None     No chief complaint on file.  HPI  Ms.Angela Woodard is a 32 y.o. female [phone] @ [redacted]w[redacted]d here in MAU with complaints of chest pressure/chest pain, the feeling like there is a band around her chest. She also reports shortness of breath and decreased O2 saturations at home in the high 80's. She has a long standing medical history, including POTS, Cardiac disease, and Ehlers-Danlos syndrome. The symptoms started last night.   She was seen on 11/25 in MAU for similar symptoms. She was discharged home with a single gestational sac.   OB History     Gravida  4   Para  1   Term  1   Preterm      AB  1   Living  1      SAB  1   IAB      Ectopic      Multiple      Live Births              Past Medical History:  Diagnosis Date   Arthritis    Asthma    Celiac disease    Depression    Ehlers-Danlos, hypermobile type    Normal echo 2021   Fibromyalgia    History of kidney stones    Mast cell activation syndrome (HCC)    Migraine    Nephropathy    OCD (obsessive compulsive disorder)    POTS (postural orthostatic tachycardia syndrome)    Pruritus    Normal bile acids 06/06/2021   Seizure disorder (HCC)    Last reported event 05/15/2021- during pt sleep   Urticaria     Past Surgical History:  Procedure Laterality Date   ADENOIDECTOMY     TYMPANOSTOMY     x3   TYMPANOSTOMY TUBE PLACEMENT      Family History  Problem Relation Age of Onset   Allergic rhinitis Mother    Hypertension Mother    Allergic rhinitis Father    Throat cancer Father    Melanoma Father    Urticaria Sister    Allergic rhinitis Sister    Anxiety disorder Sister    Memory loss Maternal Grandmother    Colon cancer Maternal Grandmother    Heart disease Paternal Grandfather    Lung cancer Paternal Grandfather    Obesity Paternal Grandfather    Diabetes Paternal Grandfather    Seizures Neg Hx      Social History   Tobacco Use   Smoking status: Never    Passive exposure: Never   Smokeless tobacco: Never  Vaping Use   Vaping status: Former   Substances: CBD  Substance Use Topics   Alcohol use: Not Currently   Drug use: No    Allergies:  Allergies  Allergen Reactions   Relpax [Eletriptan] Anaphylaxis    Throat swelling   Sulfa Antibiotics Hives and Rash   Gluten Meal    Latex Hives   Lamotrigine Rash    Medications Prior to Admission  Medication Sig Dispense Refill Last Dose   albuterol (VENTOLIN HFA) 108 (90 Base) MCG/ACT inhaler Inhale 2 puffs into the lungs every 6 (six) hours as needed for wheezing or shortness of breath. 8 g 2    amphetamine-dextroamphetamine (ADDERALL) 20 MG tablet Take 20 mg by mouth 2 (two) times daily. 20mg  in the morning and 10mg  at night      cetirizine (  ZYRTEC) 10 MG tablet Take 30 mg by mouth daily.      cromolyn (GASTROCROM) 100 MG/5ML solution Take 5 mLs (100 mg total) by mouth 3 (three) times daily before meals. 480 mL 5    cyclobenzaprine (FLEXERIL) 5 MG tablet TAKE 1 TABLET(5 MG) BY MOUTH AT BEDTIME AS NEEDED FOR MUSCLE SPASMS 30 tablet 3    EPINEPHrine 0.3 mg/0.3 mL IJ SOAJ injection Inject 0.3 mg into the muscle as needed for anaphylaxis. 1 each 2    fluticasone (FLONASE) 50 MCG/ACT nasal spray Place 2 sprays into both nostrils daily. 16 g 0    gabapentin (NEURONTIN) 400 MG capsule TAKE 1 CAPSULE BY MOUTH 3 TIMES DAILY. 270 capsule 1    midodrine (PROAMATINE) 2.5 MG tablet Take 2.5 mg by mouth 3 (three) times daily with meals. As needed for hypotension      montelukast (SINGULAIR) 10 MG tablet Take 1 tablet (10 mg total) by mouth at bedtime. 90 tablet 0    NONFORMULARY OR COMPOUNDED ITEM Take 1-2 mg by mouth daily. Naltrexone 90 each 1    propranolol (INDERAL) 10 MG tablet Take 10 mg by mouth 3 (three) times daily.      UNABLE TO FIND Take 2 capsules by mouth daily. Med Name: Zyflamend, 2 capsules once daily per patient (Patient  not taking: Reported on 05/06/2023)       Review of Systems  Respiratory:  Positive for chest tightness and shortness of breath.   Cardiovascular:  Positive for chest pain.  Gastrointestinal:  Negative for abdominal pain.   Physical Exam   Blood pressure 103/66, pulse 88, temperature (!) 97.5 F (36.4 C), temperature source Oral, resp. rate 18, height 5\' 4"  (1.626 m), weight 70.6 kg, last menstrual period 05/02/2023, SpO2 99%.  Physical Exam Vitals reviewed.  Constitutional:      General: She is not in acute distress. Neurological:     Mental Status: She is alert.     MAU Course  Procedures  MDM  Reviewed patient with [facility] ED provider who accepts transfer of the patient given cardiac history with cardiac complaints.   Duane Lope, NP 06/07/2023 4:25 PM   Assessment and Plan

## 2023-06-07 NOTE — ED Provider Notes (Signed)
[place] [facility] AT [facility] Provider Note   CSN: [id number] Arrival date & time: 06/07/23  1119     History  Chief Complaint  Patient presents with   Chest Pain    Angela Woodard is a 32 y.o. female with past medical history significant for arthritis, migraine, OCD, POTS, fibromyalgia, Ehlers-Danlos, celiac disease, seizures, mast cell activation syndrome, asthma presents to the ED complaining of chest pain that started yesterday.  Patient states that she laid down to take a nap when she woke up she felt a heaviness in her chest and like someone was "squeezing her rib cage on both sides".  Endorses some shortness of breath with it.  Patient reports last night her oxygen level was in the mid 80s and her blood pressure was also low.  Patient is currently [redacted]w[redacted]d pregnant.  Patient is [phone].  Patient was seen at [facility] on 06/01/2023 complaining of possible sinus infection and LLQ abdominal pain.  Patient reports recent illness and fever, but those symptoms resolved.  Patient continues to have abdominal pain and was supposed to get a CT scan done by GI, but this was postponed due to patient being very early on in pregnancy.  She also reports that while sitting in the ED and moving around, her HR has gone up to the 180s, but comes back down.   Patient states her HR does not typically go that high, even with her POTS.  Denies syncope, lightheadedness, dizziness, weakness, cough, fever, nausea, vomiting, diarrhea.  Patient also requesting to look at her left ear as she can "hear fluid" inside of it.         Home Medications Prior to Admission medications   Medication Sig Start Date End Date Taking? Authorizing Provider  albuterol (VENTOLIN HFA) 108 (90 Base) MCG/ACT inhaler Inhale 2 puffs into the lungs every 6 (six) hours as needed for wheezing or shortness of breath. 01/15/23  Yes Padgett, Pilar Grammes, MD  cetirizine (ZYRTEC) 10 MG tablet Take 10 mg by mouth  daily.   Yes [provider]  cromolyn (GASTROCROM) 100 MG/5ML solution Take 5 mLs (100 mg total) by mouth 3 (three) times daily before meals. 01/15/23  Yes Padgett, Pilar Grammes, MD  cyclobenzaprine (FLEXERIL) 5 MG tablet TAKE 1 TABLET(5 MG) BY MOUTH AT BEDTIME AS NEEDED FOR MUSCLE SPASMS Patient taking differently: Take 5 mg by mouth at bedtime. 05/14/23  Yes Fanny Dance, MD  EPINEPHrine 0.3 mg/0.3 mL IJ SOAJ injection Inject 0.3 mg into the muscle as needed for anaphylaxis. 01/15/23  Yes Padgett, Pilar Grammes, MD  famotidine (PEPCID) 20 MG tablet Take 20 mg by mouth in the morning, at noon, and at bedtime.   Yes [provider]  fluticasone (FLONASE) 50 MCG/ACT nasal spray Place 2 sprays into both nostrils daily. 08/17/22  Yes Claiborne Rigg, NP  gabapentin (NEURONTIN) 400 MG capsule TAKE 1 CAPSULE BY MOUTH 3 TIMES DAILY. 12/01/22  Yes Judi Saa, DO  magnesium oxide (MAG-OX) 400 MG tablet Take 200 mg by mouth daily.   Yes [provider]  midodrine (PROAMATINE) 2.5 MG tablet Take 2.5 mg by mouth 3 (three) times daily with meals. As needed for hypotension   Yes [provider]  montelukast (SINGULAIR) 10 MG tablet Take 1 tablet (10 mg total) by mouth at bedtime. 06/01/23  Yes Judi Saa, DO  propranolol (INDERAL) 10 MG tablet Take 10 mg by mouth 3 (three) times daily.   Yes [provider]  amphetamine-dextroamphetamine (ADDERALL) 20 MG tablet Take 20 mg by mouth 2 (two) times daily. 20mg  in the morning and 10mg  at night Patient not taking: Reported on 06/07/2023 12/03/22 12/03/23  [provider]  NONFORMULARY OR COMPOUNDED ITEM Take 1-2 mg by mouth daily. Naltrexone Patient not taking: Reported on 06/07/2023 06/03/23   Fanny Dance, MD      Allergies    Relpax [eletriptan], Sulfa antibiotics, Gluten meal, Latex, and Lamotrigine    Review of Systems   Review of Systems  Constitutional:  Negative for fever.   Respiratory:  Positive for chest tightness and shortness of breath. Negative for cough.   Cardiovascular:  Positive for chest pain and palpitations.  Gastrointestinal:  Positive for abdominal pain. Negative for diarrhea, nausea and vomiting.  Neurological:  Negative for dizziness, syncope, weakness and light-headedness.    Physical Exam Updated Vital Signs BP 110/75 (BP Location: Right Arm)   Pulse 71   Temp 98.4 F (36.9 C)   Resp 12   Ht 5\' 4"  (1.626 m)   Wt 70.3 kg   LMP 05/02/2023 (Approximate)   SpO2 100%   BMI 26.61 kg/m  Physical Exam Vitals and nursing note reviewed.  Constitutional:      General: She is not in acute distress.    Appearance: Normal appearance. She is not ill-appearing or diaphoretic.  HENT:     Right Ear: Tympanic membrane and ear canal normal.     Left Ear: Ear canal normal. A middle ear effusion (serous) is present. Tympanic membrane is not erythematous or bulging.  Cardiovascular:     Rate and Rhythm: Normal rate and regular rhythm.     Pulses: Normal pulses.     Heart sounds: Normal heart sounds.  Pulmonary:     Effort: Pulmonary effort is normal. No tachypnea, accessory muscle usage or respiratory distress.     Breath sounds: Normal breath sounds and air entry.  Abdominal:     General: There is distension (mild).     Palpations: Abdomen is soft.     Tenderness: There is abdominal tenderness in the periumbilical area. There is no guarding or rebound.  Musculoskeletal:     Right lower leg: No edema.     Left lower leg: No edema.  Skin:    General: Skin is warm and dry.     Capillary Refill: Capillary refill takes less than 2 seconds.  Neurological:     Mental Status: She is alert. Mental status is at baseline.  Psychiatric:        Mood and Affect: Mood normal.        Behavior: Behavior normal.     ED Results / Procedures / Treatments   Labs (all labs ordered are listed, but only abnormal results are displayed) Labs Reviewed  BASIC  METABOLIC PANEL - Abnormal; Notable for the following components:      Result Value   CO2 21 (*)    Calcium 8.8 (*)    All other components within normal limits  CBC - Abnormal; Notable for the following components:   RBC 3.84 (*)    All other components within normal limits  D-DIMER, QUANTITATIVE - Abnormal; Notable for the following components:   D-Dimer, Quant 0.77 (*)    All other components within normal limits  HCG, QUANTITATIVE, PREGNANCY - Abnormal; Notable for the following components:   hCG, Beta Chain, Quant, S 12,308 (*)    All other components within normal limits  BRAIN NATRIURETIC PEPTIDE  TROPONIN  I (HIGH SENSITIVITY)    EKG None  Radiology CT Angio Chest PE W and/or Wo Contrast  Result Date: 06/07/2023 CLINICAL DATA:  Woke up from a nap with chest heaviness and squeezing. EXAM: CT ANGIOGRAPHY CHEST WITH CONTRAST TECHNIQUE: Multidetector CT imaging of the chest was performed using the standard protocol during bolus administration of intravenous contrast. Multiplanar CT image reconstructions and MIPs were obtained to evaluate the vascular anatomy. RADIATION DOSE REDUCTION: This exam was performed according to the departmental dose-optimization program which includes automated exposure control, adjustment of the mA and/or kV according to patient size and/or use of iterative reconstruction technique. CONTRAST:  75mL OMNIPAQUE IOHEXOL 350 MG/ML SOLN COMPARISON:  Chest x-ray 06/07/2023. FINDINGS: Cardiovascular: Heart is borderline enlarged. No significant pericardial effusion. Thoracic aorta overall has a normal course and caliber. Pulsation artifact along the ascending aorta. No segmental or larger pulmonary embolism identified. Mediastinum/Nodes: No specific abnormal lymph node enlargement identified in the axillary region, hilum or mediastinum. Lungs/Pleura: No consolidation, pneumothorax or effusion. Slight basilar atelectasis. Breathing motion. Upper Abdomen: No acute  abnormality. Musculoskeletal: No chest wall abnormality. No acute or significant osseous findings. Review of the MIP images confirms the above findings. IMPRESSION: No pulmonary embolism identified.  Borderline enlarged heart. Electronically Signed   By: Karen Kays M.D.   On: 06/07/2023 17:52   DG Chest 2 View  Result Date: 06/07/2023 CLINICAL DATA:  Shortness of breath, chest pain. EXAM: CHEST - 2 VIEW COMPARISON:  February 22, 2010. FINDINGS: The heart size and mediastinal contours are within normal limits. Both lungs are clear. The visualized skeletal structures are unremarkable. IMPRESSION: No active cardiopulmonary disease. Electronically Signed   By: Lupita Raider M.D.   On: 06/07/2023 14:12    Procedures Procedures    Medications Ordered in ED Medications  iohexol (OMNIPAQUE) 350 MG/ML injection 75 mL (75 mLs Intravenous Contrast Given 06/07/23 1705)    ED Course/ Medical Decision Making/ A&P                                 Medical Decision Making Amount and/or Complexity of Data Reviewed Labs: ordered. Radiology: ordered.  Risk Prescription drug management.   This patient presents to the ED with chief complaint(s) of chest pain, shortness of breath, low SPO2 at home with pertinent past medical history of POTS, mast cell activation, seizure disorder, fibromyalgia, currently pregnant.  The complaint involves an extensive differential diagnosis and also carries with it a high risk of complications and morbidity.    The differential diagnosis includes PE, ACS, myocarditis, pericarditis   The initial plan is to obtain labs, chest x-ray  Additional history obtained: Records reviewed  records from [facility] and [facility]   Initial Assessment:   On exam, patient is resting comfortably in bed and does not appear to be in acute respiratory distress.  Lungs are clear to auscultation bilaterally with adequate tidal volume.  Heart rate is normal in the 60s with regular rhythm.   Abdomen is soft with periumbilical tenderness on palpation.  No rebound tenderness or guarding.  Independent ECG/labs interpretation:  The following labs were independently interpreted:  CBC without leukocytosis or anemia.  Metabolic panel without major electrolyte disturbance. D-dimer is elevated at 0.77.  Even with adjustment for 1st trimester pregnancy, patient is over the threshold.  Will obtain CT PE study.  Beta-hCG 12,308, which is increasing as expected.  6 days prior was 1,599.  Independent visualization and interpretation of imaging: I independently visualized the following imaging with scope of interpretation limited to determining acute life threatening conditions related to emergency care: CT PE study, which revealed no evidence of PE.  Borderline enlarged heart.   Patient was to have a CT abdomen pelvis done to investigate abdominal pain, bloating, dysphagia, and flat stools that she has been having for years.  CT was cancelled by GI when patient became pregnant.  Patient does still wish to have this scan done.  Discussed with patient the risks of radiation exposure very early on in her pregnancy including increased risk of childhood cancer.  Contacted CT and spoke with CT tech who states that CT PE study will be possible, but due to patient being very early in pregnancy, CT abdomen/pelvis not recommended.  Radiologist would be contacted by tech for further guidance on this as this is their protocol.  Will move forward with obtaining CTA to r/o PE.   Upon review of visit with gastroenterology, patient concerned for MALS as a cause of her symptoms.  Discussed with patient a safe screening that can be done for this is a mesenteric duplex ultrasound.  She would like to move forward with the [place] if possible.  Consultations obtained:   Discussed [place] options with [place] tech on the phone.  Mesenteric [place] cannot be done as STAT but patient could come back as an outpatient to have the study done.   Discussed this with patient.  After shared decision making conversation, patient will do [place] as an outpatient and scheduler will contact her to arrange this.   Disposition:   Patient's workup is overall reassuring and does not demonstrate acute finding to explain her symptoms.  She is negative for PE.  Her HR and SPO2 have both remained in normal range during the 7 hours patient was in ED.  Recommended patient follow up with her cardiologist and her gastroenterologist.    The patient has been appropriately medically screened and/or stabilized in the ED. I have low suspicion for any other emergent medical condition which would require further screening, evaluation or treatment in the ED or require inpatient management. At time of discharge the patient is hemodynamically stable and in no acute distress. I have discussed work-up results and diagnosis with patient and answered all questions. Patient is agreeable with discharge plan. We discussed strict return precautions for returning to the emergency department and they verbalized understanding.            Final Clinical Impression(s) / ED Diagnoses Final diagnoses:  Chest pain, unspecified type  Periumbilical abdominal pain  Shortness of breath    Rx / DC Orders ED Discharge Orders     None         Lenard Simmer, PA-C 06/07/23 1831    Durwin Glaze, MD 06/07/23 2032

## 2023-06-07 NOTE — [facility] Note (Addendum)
Angela Woodard is a 32 y.o. at [redacted]w[redacted]d here in [facility] reporting: she monitors her O2 saturation @ home secondary POTS syndrome and Sats have been ranging between 86-92.  States she also takes her BP at  home and BP has been lower than usual.  Also reports intermittent chest pain with a heaviness in chest while lying down.  States felt like a "boa constrictor was squeezing me."   Denies VB or abdominal pain. LMP: NA Onset of complaint: last night Pain score: 7 Vitals:   06/07/23 1135  BP: 103/66  Pulse: 88  Resp: 18  Temp: (!) 97.5 F (36.4 C)  SpO2: 99%     FHT:NA Lab orders placed from triage:   None

## 2023-06-07 NOTE — Progress Notes (Signed)
Report called to ED triage nurse regarding transfer to ED.

## 2023-06-07 NOTE — ED Triage Notes (Addendum)
Pt arrived POV from [facility] c/o CP that started a day ago. Pt states she laid down to take a nap and when she woke up she felt heaviness in her chest and feels like someone is squeezing her rib cage on both sides. Pt also had some SHOB with it. Pt does have an extensive cardiac history. Pt has Pots. Pt states last night her O2 was in the mid 80's and then her BP was also low. Pt is 5 weeks and 6 days pregnant.

## 2023-06-08 ENCOUNTER — Other Ambulatory Visit (HOSPITAL_COMMUNITY): Payer: Self-pay | Admitting: Emergency Medicine

## 2023-06-08 DIAGNOSIS — R1084 Generalized abdominal pain: Secondary | ICD-10-CM

## 2023-06-10 ENCOUNTER — Telehealth: Payer: Medicaid Other | Admitting: Physician Assistant

## 2023-06-10 DIAGNOSIS — B9689 Other specified bacterial agents as the cause of diseases classified elsewhere: Secondary | ICD-10-CM

## 2023-06-10 MED ORDER — AZITHROMYCIN 250 MG PO TABS: ORAL_TABLET | ORAL | 0 refills | Status: AC

## 2023-06-10 NOTE — Progress Notes (Signed)
E-Visit for Sinus Problems  We are sorry that you are not feeling well.  Here is how we plan to help!  Based on what you have shared with me it looks like you have sinusitis.  Sinusitis is inflammation and infection in the sinus cavities of the head.  Based on your presentation I believe you most likely have Acute Bacterial Sinusitis.  This is an infection caused by bacteria and is treated with antibiotics. I have prescribed Azithromycin 250mg Take 2 tablets on day 1, then 1 tablet daily until completed.  You may use an oral decongestant such as Mucinex D or if you have glaucoma or high blood pressure use plain Mucinex. Saline nasal spray help and can safely be used as often as needed for congestion.  If you develop worsening sinus pain, fever or notice severe headache and vision changes, or if symptoms are not better after completion of antibiotic, please schedule an appointment with a health care provider.    Sinus infections are not as easily transmitted as other respiratory infection, however we still recommend that you avoid close contact with loved ones, especially the very young and elderly.  Remember to wash your hands thoroughly throughout the day as this is the number one way to prevent the spread of infection!  Home Care: Only take medications as instructed by your medical team. Complete the entire course of an antibiotic. Do not take these medications with alcohol. A steam or ultrasonic humidifier can help congestion.  You can place a towel over your head and breathe in the steam from hot water coming from a faucet. Avoid close contacts especially the very young and the elderly. Cover your mouth when you cough or sneeze. Always remember to wash your hands.  Get Help Right Away If: You develop worsening fever or sinus pain. You develop a severe head ache or visual changes. Your symptoms persist after you have completed your treatment plan.  Make sure you Understand these  instructions. Will watch your condition. Will get help right away if you are not doing well or get worse.  Thank you for choosing an e-visit.  Your e-visit answers were reviewed by a board certified advanced clinical practitioner to complete your personal care plan. Depending upon the condition, your plan could have included both over the counter or prescription medications.  Please review your pharmacy choice. Make sure the pharmacy is open so you can pick up prescription now. If there is a problem, you may contact your provider through MyChart messaging and have the prescription routed to another pharmacy.  Your safety is important to us. If you have drug allergies check your prescription carefully.   For the next 24 hours you can use MyChart to ask questions about today's visit, request a non-urgent call back, or ask for a work or school excuse. You will get an email in the next two days asking about your experience. I hope that your e-visit has been valuable and will speed your recovery.  I have spent 5 minutes in review of e-visit questionnaire, review and updating patient chart, medical decision making and response to patient.   Koraline Phillipson M Aki Abalos, PA-C  

## 2023-06-13 ENCOUNTER — Telehealth: Payer: Medicaid Other | Admitting: Nurse Practitioner

## 2023-06-13 DIAGNOSIS — B9689 Other specified bacterial agents as the cause of diseases classified elsewhere: Secondary | ICD-10-CM

## 2023-06-13 DIAGNOSIS — J019 Acute sinusitis, unspecified: Secondary | ICD-10-CM

## 2023-06-13 NOTE — Progress Notes (Signed)
Virtual Visit Consent   Angela Woodard, you are scheduled for a virtual visit with a [facility] provider today. Just as with appointments in the office, your consent must be obtained to participate. Your consent will be active for this visit and any virtual visit you may have with one of our providers in the next 365 days. If you have a MyChart account, a copy of this consent can be sent to you electronically.  As this is a virtual visit, video technology does not allow for your provider to perform a traditional examination. This may limit your provider's ability to fully assess your condition. If your provider identifies any concerns that need to be evaluated in person or the need to arrange testing (such as labs, EKG, etc.), we will make arrangements to do so. Although advances in technology are sophisticated, we cannot ensure that it will always work on either your end or our end. If the connection with a video visit is poor, the visit may have to be switched to a telephone visit. With either a video or telephone visit, we are not always able to ensure that we have a secure connection.  By engaging in this virtual visit, you consent to the provision of healthcare and authorize for your insurance to be billed (if applicable) for the services provided during this visit. Depending on your insurance coverage, you may receive a charge related to this service.  I need to obtain your verbal consent now. Are you willing to proceed with your visit today? KERLY WEARS has provided verbal consent on 06/13/2023 for a virtual visit (video or telephone). Claiborne Rigg, NP  Date: 06/13/2023 6:47 PM  Virtual Visit via Video Note   I, Claiborne Rigg, connected with  Angela Woodard  ([id number], [birth year]) on 06/13/23 at  6:15 PM EST by a video-enabled telemedicine application and verified that I am speaking with the correct person using two identifiers.  Location: Patient: Virtual Visit  Location Patient: Home Provider: Virtual Visit Location Provider: Home Office   I discussed the limitations of evaluation and management by telemedicine and the availability of in person appointments. The patient expressed understanding and agreed to proceed.    History of Present Illness: Angela Woodard is a 32 y.o. who identifies as a female who was assigned female at birth, and is being seen today for URI .  Ms. Levon states her entire family has been sick over the past several weeks. She is currently in her first trimester of pregnancy and was treated 3 days ago with zpak for bacterial sinusitis. Today she states the purulent drainage has resolved however she is still experiencing fever tmax 100.5 over the past [redacted] weeks along with sinus pressure, headache and just feeling "gross". 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   Nephrolithiasis 12/18/2019   Seizure disorder (HCC) 12/18/2019   Depression affecting pregnancy in second trimester, antepartum 09/21/2017   Alteration consciousness 12/04/2015  Allergies:  Allergies  Allergen Reactions   Relpax [Eletriptan] Anaphylaxis    Throat swelling   Sulfa Antibiotics Hives and Rash   Gluten Meal    Latex Hives   Lamotrigine Rash   Medications:  Current Outpatient Medications:    albuterol (VENTOLIN HFA) 108 (90 Base) MCG/ACT inhaler, Inhale 2 puffs into the lungs  every 6 (six) hours as needed for wheezing or shortness of breath., Disp: 8 g, Rfl: 2   amphetamine-dextroamphetamine (ADDERALL) 20 MG tablet, Take 20 mg by mouth 2 (two) times daily. 20mg  in the morning and 10mg  at night (Patient not taking: Reported on 06/07/2023), Disp: , Rfl:    azithromycin (ZITHROMAX) 250 MG tablet, Take 2 tablets on day 1, then 1 tablet daily on days 2 through 5, Disp: 6 tablet, Rfl: 0   cetirizine (ZYRTEC) 10 MG tablet, Take 10 mg by mouth daily., Disp: , Rfl:    cromolyn (GASTROCROM) 100 MG/5ML solution, Take 5 mLs (100 mg total) by mouth 3 (three) times daily before meals., Disp: 480 mL, Rfl: 5   cyclobenzaprine (FLEXERIL) 5 MG tablet, TAKE 1 TABLET(5 MG) BY MOUTH AT BEDTIME AS NEEDED FOR MUSCLE SPASMS (Patient taking differently: Take 5 mg by mouth at bedtime.), Disp: 30 tablet, Rfl: 3   EPINEPHrine 0.3 mg/0.3 mL IJ SOAJ injection, Inject 0.3 mg into the muscle as needed for anaphylaxis., Disp: 1 each, Rfl: 2   famotidine (PEPCID) 20 MG tablet, Take 20 mg by mouth in the morning, at noon, and at bedtime., Disp: , Rfl:    fluticasone (FLONASE) 50 MCG/ACT nasal spray, Place 2 sprays into both nostrils daily., Disp: 16 g, Rfl: 0   gabapentin (NEURONTIN) 400 MG capsule, TAKE 1 CAPSULE BY MOUTH 3 TIMES DAILY., Disp: 270 capsule, Rfl: 1   magnesium oxide (MAG-OX) 400 MG tablet, Take 200 mg by mouth daily., Disp: , Rfl:    midodrine (PROAMATINE) 2.5 MG tablet, Take 2.5 mg by mouth 3 (three) times daily with meals. As needed for hypotension, Disp: , Rfl:    montelukast (SINGULAIR) 10 MG tablet, Take 1 tablet (10 mg total) by mouth at bedtime., Disp: 90 tablet, Rfl: 0   NONFORMULARY OR COMPOUNDED ITEM, Take 1-2 mg by mouth daily. Naltrexone (Patient not taking: Reported on 06/07/2023), Disp: 90 each, Rfl: 1   propranolol (INDERAL) 10 MG tablet, Take 10 mg by mouth 3 (three) times daily., Disp: , Rfl:   Observations/Objective: Patient is well-developed, well-nourished in no acute  distress.  Resting comfortably at home.  Head is normocephalic, atraumatic.  No labored breathing.  Speech is clear and coherent with logical content.  Patient is alert and oriented at baseline.    Assessment and Plan: 1. Acute bacterial sinusitis Continue zpak and conservative treatment at this time If fever persists despite taking tylenol will need face to face visit.   Follow Up Instructions: I discussed the assessment and treatment plan with the patient. The patient was provided an opportunity to ask questions and all were answered. The patient agreed with the plan and demonstrated an understanding of the instructions.  A copy of instructions were sent to the patient via MyChart unless otherwise noted below.    The patient was advised to call back or seek an in-person evaluation if the symptoms worsen or if the condition fails to improve as anticipated.    Claiborne Rigg, NP

## 2023-06-13 NOTE — Patient Instructions (Signed)
Angela Woodard, thank you for joining Claiborne Rigg, NP for today's virtual visit.  While this provider is not your primary care provider (PCP), if your PCP is located in our provider database this encounter information will be shared with them immediately following your visit.   A [facility] MyChart account gives you access to today's visit and all your visits, tests, and labs performed at [facility] " click here if you don't have a [facility] MyChart account or go to [email or web address]  Consent: (Patient) Angela Woodard provided verbal consent for this virtual visit at the beginning of the encounter.  Current Medications:  Current Outpatient Medications:    albuterol (VENTOLIN HFA) 108 (90 Base) MCG/ACT inhaler, Inhale 2 puffs into the lungs every 6 (six) hours as needed for wheezing or shortness of breath., Disp: 8 g, Rfl: 2   amphetamine-dextroamphetamine (ADDERALL) 20 MG tablet, Take 20 mg by mouth 2 (two) times daily. 20mg  in the morning and 10mg  at night (Patient not taking: Reported on 06/07/2023), Disp: , Rfl:    azithromycin (ZITHROMAX) 250 MG tablet, Take 2 tablets on day 1, then 1 tablet daily on days 2 through 5, Disp: 6 tablet, Rfl: 0   cetirizine (ZYRTEC) 10 MG tablet, Take 10 mg by mouth daily., Disp: , Rfl:    cromolyn (GASTROCROM) 100 MG/5ML solution, Take 5 mLs (100 mg total) by mouth 3 (three) times daily before meals., Disp: 480 mL, Rfl: 5   cyclobenzaprine (FLEXERIL) 5 MG tablet, TAKE 1 TABLET(5 MG) BY MOUTH AT BEDTIME AS NEEDED FOR MUSCLE SPASMS (Patient taking differently: Take 5 mg by mouth at bedtime.), Disp: 30 tablet, Rfl: 3   EPINEPHrine 0.3 mg/0.3 mL IJ SOAJ injection, Inject 0.3 mg into the muscle as needed for anaphylaxis., Disp: 1 each, Rfl: 2   famotidine (PEPCID) 20 MG tablet, Take 20 mg by mouth in the morning, at noon, and at bedtime., Disp: , Rfl:    fluticasone (FLONASE) 50 MCG/ACT nasal spray, Place 2 sprays into both  nostrils daily., Disp: 16 g, Rfl: 0   gabapentin (NEURONTIN) 400 MG capsule, TAKE 1 CAPSULE BY MOUTH 3 TIMES DAILY., Disp: 270 capsule, Rfl: 1   magnesium oxide (MAG-OX) 400 MG tablet, Take 200 mg by mouth daily., Disp: , Rfl:    midodrine (PROAMATINE) 2.5 MG tablet, Take 2.5 mg by mouth 3 (three) times daily with meals. As needed for hypotension, Disp: , Rfl:    montelukast (SINGULAIR) 10 MG tablet, Take 1 tablet (10 mg total) by mouth at bedtime., Disp: 90 tablet, Rfl: 0   NONFORMULARY OR COMPOUNDED ITEM, Take 1-2 mg by mouth daily. Naltrexone (Patient not taking: Reported on 06/07/2023), Disp: 90 each, Rfl: 1   propranolol (INDERAL) 10 MG tablet, Take 10 mg by mouth 3 (three) times daily., Disp: , Rfl:    Medications ordered in this encounter:  No orders of the defined types were placed in this encounter.    *If you need refills on other medications prior to your next appointment, please contact your pharmacy*  Follow-Up: Call back or seek an in-person evaluation if the symptoms worsen or if the condition fails to improve as anticipated.  [facility] [phone]  Other Instructions Continue zpak and conservative treatment at this time If fever persists despite taking tylenol will need face to face visit.     If you have been instructed to have an in-person evaluation today at a local Urgent Care facility, please use the link  below. It will take you to a list of all of our available [facility], including address, phone number and hours of operation. Please do not delay care.  [facility]  If you or a family member do not have a primary care provider, use the link below to schedule a visit and establish care. When you choose a [facility] primary care physician or advanced practice provider, you gain a long-term partner in health. Find a Primary Care Provider  Learn more about [facility] in-office and virtual care options: [facility] -  Get Care Now

## 2023-06-15 ENCOUNTER — Ambulatory Visit: Payer: Medicaid Other | Admitting: Podiatry

## 2023-06-16 ENCOUNTER — Ambulatory Visit (HOSPITAL_COMMUNITY)
Admission: RE | Admit: 2023-06-16 | Discharge: 2023-06-16 | Disposition: A | Discharge status: Home / Self Care | Payer: Medicaid Other | Source: Ambulatory Visit | Attending: Emergency Medicine | Admitting: Emergency Medicine

## 2023-06-16 DIAGNOSIS — R1084 Generalized abdominal pain: Secondary | ICD-10-CM | POA: Insufficient documentation

## 2023-06-18 NOTE — Progress Notes (Unsigned)
  [name] [address] [place] [address] Phone: [phone] Subjective:    I'm seeing this patient by the request  of:  Lamont Dowdy, DO  CC: back and neck pain follow up   [id number]  Angela Woodard is a 32 y.o. female coming in with complaint of back and neck pain. OMT 11/20/2022. Patient states   Medications patient has been prescribed:   Taking:         Reviewed prior external information including notes and imaging from previsou exam, outside providers and external EMR if available.   As well as notes that were available from care everywhere and other healthcare systems.  Past medical history, social, surgical and family history all reviewed in electronic medical record.  No pertanent information unless stated regarding to the chief complaint.   Past Medical History:  Diagnosis Date   Arthritis    Asthma    Celiac disease    Depression    Ehlers-Danlos, hypermobile type    Normal echo 2021   Fibromyalgia    History of kidney stones    Mast cell activation syndrome (HCC)    Migraine    Nephropathy    OCD (obsessive compulsive disorder)    POTS (postural orthostatic tachycardia syndrome)    Pruritus    Normal bile acids 06/06/2021   Seizure disorder (HCC)    Last reported event 05/15/2021- during pt sleep   Urticaria     Allergies  Allergen Reactions   Relpax [Eletriptan] Anaphylaxis    Throat swelling   Sulfa Antibiotics Hives and Rash   Gluten Meal    Latex Hives   Lamotrigine Rash     Review of Systems:  No headache, visual changes, nausea, vomiting, diarrhea, constipation, dizziness, abdominal pain, skin rash, fevers, chills, night sweats, weight loss, swollen lymph nodes, body aches, joint swelling, chest pain, shortness of breath, mood changes. POSITIVE muscle aches  Objective  Last menstrual period 05/02/2023.   General: No apparent distress alert and oriented x3 mood and affect normal,  dressed appropriately.  HEENT: Pupils equal, extraocular movements intact  Respiratory: Patient's speak in full sentences and does not appear short of breath  Cardiovascular: No lower extremity edema, non tender, no erythema  Gait MSK:  Back   Osteopathic findings  C2 flexed rotated and side bent right C6 flexed rotated and side bent left T3 extended rotated and side bent right inhaled rib T9 extended rotated and side bent left L2 flexed rotated and side bent right Sacrum right on right       Assessment and Plan:  No problem-specific Assessment & Plan notes found for this encounter.    Nonallopathic problems  Decision today to treat with OMT was based on Physical Exam  After verbal consent patient was treated with HVLA, ME, FPR techniques in cervical, rib, thoracic, lumbar, and sacral  areas  Patient tolerated the procedure well with improvement in symptoms  Patient given exercises, stretches and lifestyle modifications  See medications in patient instructions if given  Patient will follow up in 4-8 weeks    The above documentation has been reviewed and is accurate and complete Judi Saa, DO          Note: This dictation was prepared with Dragon dictation along with smaller phrase technology. Any transcriptional errors that result from this process are unintentional.

## 2023-06-19 ENCOUNTER — Ambulatory Visit: Payer: Medicaid Other | Admitting: Family Medicine

## 2023-06-22 ENCOUNTER — Telehealth: Payer: Self-pay | Admitting: Family Medicine

## 2023-06-22 ENCOUNTER — Telehealth: Payer: Self-pay | Admitting: Neurology

## 2023-06-22 ENCOUNTER — Encounter: Payer: Medicaid Other | Attending: Physical Medicine & Rehabilitation | Admitting: Physical Medicine & Rehabilitation

## 2023-06-22 NOTE — Telephone Encounter (Signed)
Patient is needed a refill on: gabapentin (NEURONTIN) 400 MG capsule   Pharmacy: [facility] [address]

## 2023-06-22 NOTE — Telephone Encounter (Signed)
Dr Lonn Georgia office called they have not received any refill request, they sent request to Dr Katrinka Blazing

## 2023-06-22 NOTE — Telephone Encounter (Signed)
Pt's spouse called in and left a message with the access nurse on 06/21/23. Caller states that pt has been out of her gabapentin 400 mg three times a day on Thursday and she reached out to her medical doctor who normally writes it yesterday and they did not fill it. She is geeling like she might have seizure. She is having a headache, anxious, hard to focus, and having visual changes. Pt is currently pregnant.

## 2023-06-22 NOTE — Telephone Encounter (Signed)
Patient reached out to Dr Karel Jarvis to see if this could be refilled by them but she referred her back to Dr Katrinka Blazing.  Per Dr Aquino's office, the patient has been out since Thursday. She told them that she feels like she might have a seizure, having headaches, anxious, hard to focus and having visual changes.  Please advise on reason for denial.  Patient is currently scheduled with Dr Katrinka Blazing on Thursday, 12/19.

## 2023-06-22 NOTE — Telephone Encounter (Signed)
[facility] PMPAware reviewed. Dr. Antoine Primas has been prescribing this medication for her since July 2023. Would check with him first on the reason for not filling, can you pls contact his staff, thanks

## 2023-06-22 NOTE — Telephone Encounter (Signed)
Full access nurse report is in Dr. Rosalyn Gess box

## 2023-06-24 ENCOUNTER — Ambulatory Visit: Payer: Medicaid Other | Admitting: Orthopaedic Surgery

## 2023-06-24 ENCOUNTER — Other Ambulatory Visit: Payer: Self-pay | Admitting: Family Medicine

## 2023-06-24 NOTE — Progress Notes (Deleted)
[name] [address] [place] [address] Phone: [phone] Subjective:    I'm seeing this patient by the request  of:  Angela Dowdy, DO  CC: Polyarthralgia follow-up  [id number]  01/20/2023 Patient has some hypermobility that likely is contributing.  Discussed with patient different treatment options but patient has elected to follow-up with orthopedic surgery and would like referral.    Patient states that she has some difficulty swallowing. Describes that at 1 point she may have been diagnosed with a diverticulum but never finished workup. Will refer to GI for further evaluation if she would like.  Patient is doing a little better already with the naltrexone at low-dose.  Working with formal physical therapist.  Discussed with patient to continue to stay active.  Attempted osteopathic manipulation for this.  Hopeful that will make some improvement.  Follow-up with me again in 2 to 3 months.  Awaiting referral for nephrology.   Updated 06/25/2023 Angela Woodard is a 32 y.o. female coming in with complaint of polyarthralgia      Past Medical History:  Diagnosis Date   Arthritis    Asthma    Celiac disease    Depression    Ehlers-Danlos, hypermobile type    Normal echo 2021   Fibromyalgia    History of kidney stones    Mast cell activation syndrome (HCC)    Migraine    Nephropathy    OCD (obsessive compulsive disorder)    POTS (postural orthostatic tachycardia syndrome)    Pruritus    Normal bile acids 06/06/2021   Seizure disorder (HCC)    Last reported event 05/15/2021- during pt sleep   Urticaria    Past Surgical History:  Procedure Laterality Date   ADENOIDECTOMY     TYMPANOSTOMY     x3   TYMPANOSTOMY TUBE PLACEMENT     Social History   Socioeconomic History   Marital status: Married    Spouse name: Psychologist, clinical   Number of children: 1   Years of education: some colle   Highest education level: Not on file   Occupational History   Occupation: Engineer, water at [employer]  Tobacco Use   Smoking status: Never    Passive exposure: Never   Smokeless tobacco: Never  Vaping Use   Vaping status: Former   Substances: CBD  Substance and Sexual Activity   Alcohol use: Not Currently   Drug use: No   Sexual activity: Yes    Birth control/protection: Condom  Other Topics Concern   Not on file  Social History Narrative   Lives with husband and 2 adopted special needs children and 87 month old   Caffeine use: 1 serving daily   Right handed    Social Drivers of Health   Financial Resource Strain: Low Risk  (05/28/2023)   Received from [facility]   Overall Financial Resource Strain (CARDIA)    Difficulty of Paying Living Expenses: Not very hard  Food Insecurity: No Food Insecurity (05/28/2023)   Received from [facility]   Hunger Vital Sign    Worried About Running Out of Food in the Last Year: Never true    Ran Out of Food in the Last Year: Never true  Transportation Needs: No Transportation Needs (05/28/2023)   Received from [facility]   PRAPARE - Transportation    Lack of Transportation (Medical): No    Lack of Transportation (Non-Medical): No  Physical Activity: Not on file  Stress: Not on file  Social Connections: Unknown (02/02/2023)   Received from [facility]   Social Network    Social Network: Not on file   Allergies  Allergen Reactions   Relpax [Eletriptan] Anaphylaxis    Throat swelling   Sulfa Antibiotics Hives and Rash   Gluten Meal    Latex Hives   Lamotrigine Rash   Family History  Problem Relation Age of Onset   Allergic rhinitis Mother    Hypertension Mother    Allergic rhinitis Father    Throat cancer Father    Melanoma Father    Urticaria Sister    Allergic rhinitis Sister    Anxiety disorder Sister    Memory loss Maternal Grandmother    Colon cancer Maternal Grandmother    Heart disease Paternal Grandfather    Lung  cancer Paternal Grandfather    Obesity Paternal Grandfather    Diabetes Paternal Grandfather    Seizures Neg Hx      Current Outpatient Medications (Cardiovascular):    EPINEPHrine 0.3 mg/0.3 mL IJ SOAJ injection, Inject 0.3 mg into the muscle as needed for anaphylaxis.   midodrine (PROAMATINE) 2.5 MG tablet, Take 2.5 mg by mouth 3 (three) times daily with meals. As needed for hypotension   propranolol (INDERAL) 10 MG tablet, Take 10 mg by mouth 3 (three) times daily.  Current Outpatient Medications (Respiratory):    albuterol (VENTOLIN HFA) 108 (90 Base) MCG/ACT inhaler, Inhale 2 puffs into the lungs every 6 (six) hours as needed for wheezing or shortness of breath.   cetirizine (ZYRTEC) 10 MG tablet, Take 10 mg by mouth daily.   fluticasone (FLONASE) 50 MCG/ACT nasal spray, Place 2 sprays into both nostrils daily.   montelukast (SINGULAIR) 10 MG tablet, Take 1 tablet (10 mg total) by mouth at bedtime.    Current Outpatient Medications (Other):    amphetamine-dextroamphetamine (ADDERALL) 20 MG tablet, Take 20 mg by mouth 2 (two) times daily. 20mg  in the morning and 10mg  at night (Patient not taking: Reported on 06/07/2023)   cromolyn (GASTROCROM) 100 MG/5ML solution, Take 5 mLs (100 mg total) by mouth 3 (three) times daily before meals.   cyclobenzaprine (FLEXERIL) 5 MG tablet, TAKE 1 TABLET(5 MG) BY MOUTH AT BEDTIME AS NEEDED FOR MUSCLE SPASMS (Patient taking differently: Take 5 mg by mouth at bedtime.)   famotidine (PEPCID) 20 MG tablet, Take 20 mg by mouth in the morning, at noon, and at bedtime.   gabapentin (NEURONTIN) 400 MG capsule, TAKE 1 CAPSULE BY MOUTH 3 TIMES DAILY.   magnesium oxide (MAG-OX) 400 MG tablet, Take 200 mg by mouth daily.   NONFORMULARY OR COMPOUNDED ITEM, Take 1-2 mg by mouth daily. Naltrexone (Patient not taking: Reported on 06/07/2023)   Reviewed prior external information including notes and imaging from  primary care provider As well as notes that were  available from care everywhere and other healthcare systems.  Past medical history, social, surgical and family history all reviewed in electronic medical record.  No pertanent information unless stated regarding to the chief complaint.   Review of Systems:  No headache, visual changes, nausea, vomiting, diarrhea, constipation, dizziness, abdominal pain, skin rash, fevers, chills, night sweats, weight loss, swollen lymph nodes, body aches, joint swelling, chest pain, shortness of breath, mood changes. POSITIVE muscle aches  Objective  Last menstrual period 05/02/2023.   General: No apparent distress alert and oriented x3 mood and affect normal, dressed appropriately.  HEENT: Pupils equal, extraocular movements intact  Respiratory: Patient's speak in  full sentences and does not appear short of breath  Cardiovascular: No lower extremity edema, non tender, no erythema      Impression and Recommendations:    The above documentation has been reviewed and is accurate and complete Judi Saa, DO

## 2023-06-25 ENCOUNTER — Ambulatory Visit: Payer: Medicaid Other | Admitting: Family Medicine

## 2023-06-25 ENCOUNTER — Encounter: Payer: Self-pay | Admitting: Family Medicine

## 2023-06-26 ENCOUNTER — Encounter (HOSPITAL_COMMUNITY): Payer: Self-pay

## 2023-06-26 ENCOUNTER — Inpatient Hospital Stay (HOSPITAL_COMMUNITY)
Admission: AD | Admit: 2023-06-26 | Discharge: 2023-06-26 | Discharge status: Against Medical Advice | Payer: Medicaid Other | Attending: Obstetrics & Gynecology | Admitting: Obstetrics & Gynecology

## 2023-06-26 ENCOUNTER — Emergency Department (HOSPITAL_COMMUNITY)
Admission: EM | Admit: 2023-06-26 | Discharge: 2023-06-26 | Disposition: A | Discharge status: Home / Self Care | Payer: Medicaid Other

## 2023-06-26 ENCOUNTER — Other Ambulatory Visit: Payer: Self-pay

## 2023-06-26 ENCOUNTER — Emergency Department (HOSPITAL_COMMUNITY): Payer: Medicaid Other

## 2023-06-26 DIAGNOSIS — O219 Vomiting of pregnancy, unspecified: Secondary | ICD-10-CM | POA: Insufficient documentation

## 2023-06-26 DIAGNOSIS — Z3A08 8 weeks gestation of pregnancy: Secondary | ICD-10-CM | POA: Diagnosis not present

## 2023-06-26 DIAGNOSIS — M25551 Pain in right hip: Secondary | ICD-10-CM | POA: Insufficient documentation

## 2023-06-26 DIAGNOSIS — M25552 Pain in left hip: Secondary | ICD-10-CM | POA: Diagnosis not present

## 2023-06-26 DIAGNOSIS — O26891 Other specified pregnancy related conditions, first trimester: Secondary | ICD-10-CM | POA: Insufficient documentation

## 2023-06-26 DIAGNOSIS — Z91040 Latex allergy status: Secondary | ICD-10-CM | POA: Diagnosis not present

## 2023-06-26 LAB — CBC WITH DIFFERENTIAL/PLATELET
Abs Immature Granulocytes: 0.03 10*3/uL (ref 0.00–0.07)
Basophils Absolute: 0 10*3/uL (ref 0.0–0.1)
Basophils Relative: 0 %
Eosinophils Absolute: 0 10*3/uL (ref 0.0–0.5)
Eosinophils Relative: 0 %
HCT: 40 % (ref 36.0–46.0)
Hemoglobin: 13.5 g/dL (ref 12.0–15.0)
Immature Granulocytes: 0 %
Lymphocytes Relative: 6 %
Lymphs Abs: 0.5 10*3/uL — ABNORMAL LOW (ref 0.7–4.0)
MCH: 32.7 pg (ref 26.0–34.0)
MCHC: 33.8 g/dL (ref 30.0–36.0)
MCV: 96.9 fL (ref 80.0–100.0)
Monocytes Absolute: 0.4 10*3/uL (ref 0.1–1.0)
Monocytes Relative: 5 %
Neutro Abs: 8 10*3/uL — ABNORMAL HIGH (ref 1.7–7.7)
Neutrophils Relative %: 89 %
Platelets: 252 10*3/uL (ref 150–400)
RBC: 4.13 MIL/uL (ref 3.87–5.11)
RDW: 12.2 % (ref 11.5–15.5)
WBC: 9 10*3/uL (ref 4.0–10.5)
nRBC: 0 % (ref 0.0–0.2)

## 2023-06-26 LAB — COMPREHENSIVE METABOLIC PANEL
ALT: 25 U/L (ref 0–44)
AST: 20 U/L (ref 15–41)
Albumin: 4.4 g/dL (ref 3.5–5.0)
Alkaline Phosphatase: 51 U/L (ref 38–126)
Anion gap: 10 (ref 5–15)
BUN: <5 mg/dL — ABNORMAL LOW (ref 6–20)
CO2: 22 mmol/L (ref 22–32)
Calcium: 9.2 mg/dL (ref 8.9–10.3)
Chloride: 103 mmol/L (ref 98–111)
Creatinine, Ser: 0.41 mg/dL — ABNORMAL LOW (ref 0.44–1.00)
GFR, Estimated: >60 mL/min (ref >60)
Glucose, Bld: 109 mg/dL — ABNORMAL HIGH (ref 70–99)
Potassium: 3.3 mmol/L — ABNORMAL LOW (ref 3.5–5.1)
Sodium: 135 mmol/L (ref 135–145)
Total Bilirubin: 0.7 mg/dL (ref <1.2)
Total Protein: 8.3 g/dL — ABNORMAL HIGH (ref 6.5–8.1)

## 2023-06-26 LAB — URINALYSIS, W/ REFLEX TO CULTURE (INFECTION SUSPECTED)
Bilirubin Urine: NEGATIVE
Glucose, UA: NEGATIVE mg/dL
Ketones, ur: 20 mg/dL — AB
Leukocytes,Ua: NEGATIVE
Nitrite: NEGATIVE
Protein, ur: 30 mg/dL — AB
RBC / HPF: >50 RBC/hpf (ref 0–5)
Specific Gravity, Urine: 1.016 (ref 1.005–1.030)
pH: 5 (ref 5.0–8.0)

## 2023-06-26 LAB — LIPASE, BLOOD: Lipase: 32 U/L (ref 11–51)

## 2023-06-26 MED ORDER — SODIUM CHLORIDE 0.9 % IV BOLUS: 1000.0000 mL | Freq: Once | INTRAVENOUS | Status: DC

## 2023-06-26 MED ORDER — METOCLOPRAMIDE HCL 10 MG PO TABS
10.0000 mg | ORAL_TABLET | Freq: Once | ORAL | Status: AC
  Administered 2023-06-26: 10 mg via ORAL
  Filled 2023-06-26: qty 1

## 2023-06-26 MED ORDER — ONDANSETRON 4 MG PO TBDP: 4.0000 mg | ORAL_TABLET | Freq: Three times a day (TID) | ORAL | 0 refills | Status: DC | PRN

## 2023-06-26 MED ORDER — ONDANSETRON 4 MG PO TBDP
4.0000 mg | ORAL_TABLET | Freq: Once | ORAL | Status: DC
Start: 2023-06-26 — End: 2023-06-27

## 2023-06-26 NOTE — ED Triage Notes (Signed)
Pt reports vomiting, pt is [redacted] weeks pregnant. C/o bilateral hip pain that is crushing and radiates down both legs. Some abdominal cramping.

## 2023-06-26 NOTE — ED Provider Triage Note (Addendum)
Emergency Medicine Provider Triage Evaluation Note  SHAUN RUBEL , a 32 y.o. female  was evaluated in triage.  Pt complains of emesis. Family with stomach bug. Hyperemesis grad with prior preg. Some pelvic pain. Nothing for emesis. [place] at 5 week showed IUP and cyst  Hx of epilepsy---on gabapentin  Obgyn- no current obg- MFM with [facility] on Dec 30  Review of Systems  Positive: Emesis, flank pain, abd cramping Negative:   Physical Exam  BP 131/69 (BP Location: Right Arm)   Pulse (!) 106   Temp 99.1 F (37.3 C) (Oral)   Resp 18   Ht 5\' 4"  (1.626 m)   Wt 70.3 kg   LMP 05/02/2023 (Approximate)   SpO2 100%   BMI 26.61 kg/m  Gen:   Awake, no distress   Resp:  Normal effort  MSK:   Moves extremities without difficulty  Other:    Medical Decision Making  Medically screening exam initiated at 6:00 PM.  Appropriate orders placed.  GYSELLE RENSCHLER was informed that the remainder of the evaluation will be completed by another provider, this initial triage assessment does not replace that evaluation, and the importance of remaining in the ED until their evaluation is complete.  Emesis, preg, flank pain, abd pain       Nicloe Frontera A, PA-C 06/26/23 1803

## 2023-06-26 NOTE — ED Provider Notes (Signed)
[place] [facility] DEPARTMENT AT [facility] Provider Note   CSN: [id number] Arrival date & time: 06/26/23  1734    History  Chief Complaint  Patient presents with   Emesis During Pregnancy    KARIMAH FOXWORTH is a 32 y.o. female multiple medical problems here for evaluation of nausea and vomiting.  States her whole family has had a "GI bug."  She does admit to hyperemesis gravidarum with prior pregnancies.  She has had some diffuse abdominal cramping.  No flank pain.  She had an ultrasound at 5 weeks which showed IUP as well as an ovarian cyst on the right.  She has a history of epilepsy on gabapentin.  She is not currently followed by OB/GYN, she follows with MFM at [facility] due to high risk pregnancy.  She has multiple medical problems and sees various specialists.  No urinary symptoms.  Family all sick with similar symptoms.  Has been unable to keep down her home meds since emesis around midnight.  HPI     Home Medications Prior to Admission medications   Medication Sig Start Date End Date Taking? Authorizing Provider  ondansetron (ZOFRAN-ODT) 4 MG disintegrating tablet Take 1 tablet (4 mg total) by mouth every 8 (eight) hours as needed. 06/26/23  Yes Cherylin Waguespack A, PA-C  albuterol (VENTOLIN HFA) 108 (90 Base) MCG/ACT inhaler Inhale 2 puffs into the lungs every 6 (six) hours as needed for wheezing or shortness of breath. 01/15/23   Marcelyn Bruins, MD  amphetamine-dextroamphetamine (ADDERALL) 20 MG tablet Take 20 mg by mouth 2 (two) times daily. 20mg  in the morning and 10mg  at night Patient not taking: Reported on 06/07/2023 12/03/22 12/03/23  [provider]  cetirizine (ZYRTEC) 10 MG tablet Take 10 mg by mouth daily.    [provider]  cromolyn (GASTROCROM) 100 MG/5ML solution Take 5 mLs (100 mg total) by mouth 3 (three) times daily before meals. 01/15/23   Marcelyn Bruins, MD  cyclobenzaprine (FLEXERIL) 5 MG tablet TAKE 1  TABLET(5 MG) BY MOUTH AT BEDTIME AS NEEDED FOR MUSCLE SPASMS Patient taking differently: Take 5 mg by mouth at bedtime. 05/14/23   Fanny Dance, MD  EPINEPHrine 0.3 mg/0.3 mL IJ SOAJ injection Inject 0.3 mg into the muscle as needed for anaphylaxis. 01/15/23   Marcelyn Bruins, MD  famotidine (PEPCID) 20 MG tablet Take 20 mg by mouth in the morning, at noon, and at bedtime.    [provider]  fluticasone (FLONASE) 50 MCG/ACT nasal spray Place 2 sprays into both nostrils daily. 08/17/22   Claiborne Rigg, NP  gabapentin (NEURONTIN) 400 MG capsule TAKE 1 CAPSULE BY MOUTH 3 TIMES DAILY. 12/01/22   Judi Saa, DO  magnesium oxide (MAG-OX) 400 MG tablet Take 200 mg by mouth daily.    [provider]  midodrine (PROAMATINE) 2.5 MG tablet Take 2.5 mg by mouth 3 (three) times daily with meals. As needed for hypotension    [provider]  montelukast (SINGULAIR) 10 MG tablet Take 1 tablet (10 mg total) by mouth at bedtime. 06/01/23   Judi Saa, DO  NONFORMULARY OR COMPOUNDED ITEM Take 1-2 mg by mouth daily. Naltrexone Patient not taking: Reported on 06/07/2023 06/03/23   Fanny Dance, MD  propranolol (INDERAL) 10 MG tablet Take 10 mg by mouth 3 (three) times daily.    [provider]      Allergies    Relpax [eletriptan], Sulfa antibiotics, Gluten meal, Latex, and Lamotrigine  Review of Systems   Review of Systems  Constitutional: Negative.   HENT: Negative.    Respiratory: Negative.    Cardiovascular: Negative.   Gastrointestinal:  Positive for diarrhea, nausea and vomiting. Negative for abdominal distention, abdominal pain, anal bleeding, blood in stool and rectal pain.  Genitourinary: Negative.   Musculoskeletal: Negative.   Skin: Negative.   Neurological: Negative.   All other systems reviewed and are negative.  Physical Exam Updated Vital Signs BP 128/72   Pulse (!) 102   Temp 99.1 F (37.3 C) (Oral)   Resp 16   Ht  5\' 4"  (1.626 m)   Wt 70.3 kg   LMP 05/02/2023 (Approximate)   SpO2 100%   BMI 26.61 kg/m  Physical Exam Vitals and nursing note reviewed.  Constitutional:      General: She is not in acute distress.    Appearance: She is well-developed. She is not ill-appearing, toxic-appearing or diaphoretic.  HENT:     Head: Atraumatic.     Nose: Nose normal.     Mouth/Throat:     Mouth: Mucous membranes are moist.  Eyes:     Pupils: Pupils are equal, round, and reactive to light.  Cardiovascular:     Rate and Rhythm: Normal rate.     Pulses: Normal pulses.     Heart sounds: Normal heart sounds.  Pulmonary:     Effort: Pulmonary effort is normal. No respiratory distress.     Breath sounds: Normal breath sounds.  Abdominal:     General: Bowel sounds are normal. There is no distension.     Palpations: Abdomen is soft. There is no mass.     Tenderness: There is abdominal tenderness. There is no right CVA tenderness, left CVA tenderness, guarding or rebound.     Hernia: No hernia is present.  Musculoskeletal:        General: No swelling, tenderness, deformity or signs of injury. Normal range of motion.     Cervical back: Normal range of motion.     Right lower leg: No edema.     Left lower leg: No edema.  Skin:    General: Skin is warm and dry.     Capillary Refill: Capillary refill takes less than 2 seconds.  Neurological:     General: No focal deficit present.     Mental Status: She is alert.     Cranial Nerves: No cranial nerve deficit.     Motor: No weakness.     Gait: Gait normal.  Psychiatric:        Mood and Affect: Mood normal.    ED Results / Procedures / Treatments   Labs (all labs ordered are listed, but only abnormal results are displayed) Labs Reviewed  CBC WITH DIFFERENTIAL/PLATELET - Abnormal; Notable for the following components:      Result Value   Neutro Abs 8.0 (*)    Lymphs Abs 0.5 (*)    All other components within normal limits  COMPREHENSIVE METABOLIC  PANEL - Abnormal; Notable for the following components:   Potassium 3.3 (*)    Glucose, Bld 109 (*)    BUN <5 (*)    Creatinine, Ser 0.41 (*)    Total Protein 8.3 (*)    All other components within normal limits  URINALYSIS, W/ REFLEX TO CULTURE (INFECTION SUSPECTED) - Abnormal; Notable for the following components:   APPearance HAZY (*)    Hgb urine dipstick LARGE (*)    Ketones, ur 20 (*)  Protein, ur 30 (*)    Bacteria, UA RARE (*)    All other components within normal limits  LIPASE, BLOOD  HCG, QUANTITATIVE, PREGNANCY    EKG None  Radiology US PELVIC COMPLETE W TRANSVAGINAL AND TORSION R/O Result Date: 06/26/2023 CLINICAL DATA:  Pelvic pain EXAM: TRANSABDOMINAL AND TRANSVAGINAL ULTRASOUND OF PELVIS DOPPLER ULTRASOUND OF OVARIES TECHNIQUE: Both transabdominal and transvaginal ultrasound examinations of the pelvis were performed. Transabdominal technique was performed for global imaging of the pelvis including uterus, ovaries, adnexal regions, and pelvic cul-de-sac. It was necessary to proceed with endovaginal exam following the transabdominal exam to visualize the uterus and ovaries. Color and duplex Doppler ultrasound was utilized to evaluate blood flow to the ovaries. COMPARISON:  None Available. FINDINGS: Uterus Measurements: 8.9 x 6.4 x 7.0 cm = volume: 209 mL. No fibroids or other mass visualized. Endometrium Thickness: Intrauterine pregnancy visualized, not measured. Evaluation of the pregnancy not requested by clinician. Right ovary Measurements: 4.0 x 4.0 x 3.6 cm = volume: 31 mL. 3.2 cm simple appearing ovarian cyst. No additional adnexal mass. Left ovary Measurements: Not visualized.  No adnexal mass seen. Pulsed Doppler evaluation of the right ovary demonstrates normal low-resistance arterial and venous waveforms. Other findings No abnormal free fluid. IMPRESSION: Small right ovarian cyst likely reflecting dominant follicle or functional cyst. No evidence of torsion. No  followup imaging recommended. Note: This recommendation does not apply to premenarchal patients or to those with increased risk (genetic, family history, elevated tumor markers or other high-risk factors) of ovarian cancer. Reference: Radiology 2019 Nov; 293(2):359-371. Electronically Signed   By: Charlett Nose M.D.   On: 06/26/2023 20:40    Procedures Procedures    Medications Ordered in ED Medications  ondansetron (ZOFRAN-ODT) disintegrating tablet 4 mg (4 mg Oral Patient Refused/Not Given 06/26/23 1934)  sodium chloride 0.9 % bolus 1,000 mL (1,000 mLs Intravenous Patient Refused/Not Given 06/26/23 2049)  metoCLOPramide (REGLAN) tablet 10 mg (10 mg Oral Given 06/26/23 1939)    ED Course/ Medical Decision Making/ A&P   32 year old old with medical problems approximately [redacted] weeks pregnant here for evaluation persistent nausea and vomiting.  States her family has had a "stomach bug."  She has had nausea and vomiting for the last 12 hours.  Has some lower abdominal cramping.  No vaginal bleeding, fluid leakage.  She states she is high risk due to her multiple medical problems.  She is following with [facility], MFM.  She is a history of epilepsy has been unable to keep down her gabapentin which she takes for her seizures.  Plan on labs, imaging and reassess  Labs and imaging personally viewed and interpreted:  CBC without leukocytosis Metabolic panel potassium 3.1 Lipase 32 UA with blood, no infection Ultrasound negative for torsion, shows IUP which is similar to her prior ultrasounds in this pregnancy, she does have a right ovarian cyst  Patient reassessed.  She states she has bilateral hip pain.  I offered medication here which she declined.  She is requesting gabapentin which she takes for her nerve pain as well.  I do not feel comfortable prescribing this in pregnancy however I did offer her other medications which she declined.  She is requesting discharge home and states she is follow-up  outpatient.  No URI symptoms.  Low suspicion for ectopic pregnancy, pyelonephritis, UTI, STI, obstruction, perforation, infected kidney stone, appendicitis, diverticulitis, cholecystitis, choledocholithiasis  Will have follow-up outpatient, return for new or worsening symptoms.  The patient has been appropriately medically screened and/or  stabilized in the ED. I have low suspicion for any other emergent medical condition which would require further screening, evaluation or treatment in the ED or require inpatient management.  Patient is hemodynamically stable and in no acute distress.  Patient able to ambulate in department prior to ED.  Evaluation does not show acute pathology that would require ongoing or additional emergent interventions while in the emergency department or further inpatient treatment.  I have discussed the diagnosis with the patient and answered all questions.  Pain is been managed while in the emergency department and patient has no further complaints prior to discharge.  Patient is comfortable with plan discussed in room and is stable for discharge at this time.  I have discussed strict return precautions for returning to the emergency department.  Patient was encouraged to follow-up with PCP/specialist refer to at discharge.                                Medical Decision Making Amount and/or Complexity of Data Reviewed Independent Historian: spouse External Data Reviewed: labs, radiology, ECG and notes. Labs: ordered. Decision-making details documented in ED Course. Radiology: ordered and independent interpretation performed. Decision-making details documented in ED Course. ECG/medicine tests: ordered and independent interpretation performed. Decision-making details documented in ED Course.  Risk OTC drugs. Prescription drug management. Parenteral controlled substances. Decision regarding hospitalization. Diagnosis or treatment significantly limited by social  determinants of health.         Final Clinical Impression(s) / ED Diagnoses Final diagnoses:  Nausea and vomiting during pregnancy    Rx / DC Orders ED Discharge Orders          Ordered    ondansetron (ZOFRAN-ODT) 4 MG disintegrating tablet  Every 8 hours PRN        06/26/23 2047              Kenyatte Chatmon A, PA-C 06/26/23 2303    Coral Spikes, DO 06/26/23 2343

## 2023-06-26 NOTE — Progress Notes (Signed)
Patient left MAU before being triaged or seen by a provider.

## 2023-06-26 NOTE — Telephone Encounter (Signed)
Patient sent multiple messages. Forward one to Dr. Katrinka Blazing. Waiting for reply.

## 2023-06-26 NOTE — Discharge Instructions (Signed)
I have written you for some Zofran to help with your nausea and vomiting make sure to follow-up outpatient with your neurologist as well as MFM  Return for any worsening symptoms

## 2023-06-29 ENCOUNTER — Telehealth: Payer: Self-pay | Admitting: Neurology

## 2023-06-29 MED ORDER — GABAPENTIN 400 MG PO CAPS: 400.0000 mg | ORAL_CAPSULE | Freq: Three times a day (TID) | ORAL | 1 refills | Status: DC

## 2023-06-29 NOTE — Telephone Encounter (Signed)
Pls let her know I reviewed the notes and see Dr. Michaelle Copas concerns. Gabapentin has a risk of potential cardiac malformations in the first trimester, later in pregnancy risks for preterm birth and small for gestational age. As long as she is aware of these risks and accepts them because benefits (seizure control) outweigh potential risks, then can continue Gabapentin. I sent in refills. Thanks

## 2023-06-29 NOTE — Telephone Encounter (Signed)
Pt called an informed Dr Karel Jarvis reviewed the notes and see Dr. Michaelle Copas concerns. Gabapentin has a risk of potential cardiac malformations in the first trimester, later in pregnancy risks for preterm birth and small for gestational age. As long as she is aware of these risks and accepts them because benefits (seizure control) outweigh potential risks, then can continue Gabapentin. Pt would like to continue with gabapentin. Dr Karel Jarvis sent in refills

## 2023-06-29 NOTE — Telephone Encounter (Signed)
Patient husband called and states that the patient is on Gabapentin for her seizures. He states that It was given to her by a different provider that will not refill it now because the patient is  [redacted] weeks pregnant. He wants to speak to someone in our office about what they need to do.   Please call him at [phone] or the patient at [phone]

## 2023-07-03 ENCOUNTER — Telehealth: Payer: Medicaid Other | Admitting: Physician Assistant

## 2023-07-03 DIAGNOSIS — B9689 Other specified bacterial agents as the cause of diseases classified elsewhere: Secondary | ICD-10-CM

## 2023-07-03 DIAGNOSIS — J019 Acute sinusitis, unspecified: Secondary | ICD-10-CM | POA: Diagnosis not present

## 2023-07-03 MED ORDER — CEFDINIR 300 MG PO CAPS: 300.0000 mg | ORAL_CAPSULE | Freq: Two times a day (BID) | ORAL | 0 refills | Status: DC

## 2023-07-03 NOTE — Progress Notes (Signed)

## 2023-07-07 ENCOUNTER — Telehealth: Payer: Medicaid Other

## 2023-07-14 ENCOUNTER — Encounter: Payer: Medicaid Other | Admitting: Obstetrics and Gynecology

## 2023-07-23 ENCOUNTER — Ambulatory Visit: Payer: Medicaid Other | Admitting: Family Medicine

## 2023-09-07 ENCOUNTER — Telehealth: Admitting: Physician Assistant

## 2023-09-07 DIAGNOSIS — J019 Acute sinusitis, unspecified: Secondary | ICD-10-CM

## 2023-09-07 DIAGNOSIS — B9689 Other specified bacterial agents as the cause of diseases classified elsewhere: Secondary | ICD-10-CM | POA: Diagnosis not present

## 2023-09-07 MED ORDER — CEFDINIR 300 MG PO CAPS
300.0000 mg | ORAL_CAPSULE | Freq: Two times a day (BID) | ORAL | 0 refills | Status: DC
Start: 2023-09-07 — End: 2023-10-27

## 2023-09-07 NOTE — Progress Notes (Signed)

## 2023-09-10 ENCOUNTER — Other Ambulatory Visit: Payer: Self-pay | Admitting: Family Medicine

## 2023-09-14 ENCOUNTER — Other Ambulatory Visit: Payer: Self-pay | Admitting: Family Medicine

## 2023-09-16 ENCOUNTER — Other Ambulatory Visit: Payer: Self-pay | Admitting: Family Medicine

## 2023-09-23 ENCOUNTER — Encounter: Payer: Self-pay | Admitting: Family Medicine

## 2023-10-03 ENCOUNTER — Other Ambulatory Visit: Payer: Self-pay | Admitting: Physical Medicine & Rehabilitation

## 2023-10-17 ENCOUNTER — Other Ambulatory Visit: Payer: Self-pay | Admitting: Allergy

## 2023-10-21 ENCOUNTER — Inpatient Hospital Stay (HOSPITAL_COMMUNITY)
Admission: AD | Admit: 2023-10-21 | Discharge: 2023-10-21 | Disposition: A | Discharge status: Home / Self Care | Attending: Obstetrics & Gynecology | Admitting: Obstetrics & Gynecology

## 2023-10-21 ENCOUNTER — Encounter (HOSPITAL_COMMUNITY): Payer: Self-pay | Admitting: Obstetrics & Gynecology

## 2023-10-21 DIAGNOSIS — Z0371 Encounter for suspected problem with amniotic cavity and membrane ruled out: Secondary | ICD-10-CM | POA: Diagnosis present

## 2023-10-21 DIAGNOSIS — O368190 Decreased fetal movements, unspecified trimester, not applicable or unspecified: Secondary | ICD-10-CM

## 2023-10-21 DIAGNOSIS — Z3A24 24 weeks gestation of pregnancy: Secondary | ICD-10-CM | POA: Diagnosis not present

## 2023-10-21 DIAGNOSIS — Z3493 Encounter for supervision of normal pregnancy, unspecified, third trimester: Secondary | ICD-10-CM

## 2023-10-21 DIAGNOSIS — Z3689 Encounter for other specified antenatal screening: Secondary | ICD-10-CM

## 2023-10-21 DIAGNOSIS — O368120 Decreased fetal movements, second trimester, not applicable or unspecified: Secondary | ICD-10-CM | POA: Diagnosis present

## 2023-10-21 LAB — WET PREP, GENITAL
Clue Cells Wet Prep HPF POC: NONE SEEN
Sperm: NONE SEEN
Trich, Wet Prep: NONE SEEN
WBC, Wet Prep HPF POC: >=10 — AB (ref <10)
Yeast Wet Prep HPF POC: NONE SEEN

## 2023-10-21 LAB — URINALYSIS, ROUTINE W REFLEX MICROSCOPIC
Bacteria, UA: NONE SEEN
Bilirubin Urine: NEGATIVE
Glucose, UA: NEGATIVE mg/dL
Ketones, ur: NEGATIVE mg/dL
Leukocytes,Ua: NEGATIVE
Nitrite: NEGATIVE
Protein, ur: NEGATIVE mg/dL
Specific Gravity, Urine: 1.01 (ref 1.005–1.030)
pH: 5 (ref 5.0–8.0)

## 2023-10-21 LAB — POCT FERN TEST: POCT Fern Test: NEGATIVE

## 2023-10-21 LAB — RUPTURE OF MEMBRANE (ROM)PLUS: Rom Plus: NEGATIVE

## 2023-10-21 NOTE — MAU Note (Signed)
.  Angela Woodard is a 33 y.o. at [redacted]w[redacted]d here in MAU reporting: receives [facility] at [facility]. Reports gush of fluid around 45 minutes ago - clear fluid. Has continued to leak since. Denies recent IC, VB, or abnormal discharge. She also reports [facility] that have been on-going for a while. Has felt less FM since this began. Has on-going HAs - 4-5/10 at this time.   LMP: EDD 02/06/2024 Onset of complaint: today Pain score: 5/10 Vitals:   10/21/23 1621  BP: (!) 107/55  Pulse: 61  Resp: 16  Temp: 98.3 F (36.8 C)     FHT: bypass triage, taken directly to room  Lab orders placed from triage: N/A

## 2023-10-21 NOTE — MAU Provider Note (Signed)
 Chief Complaint:  Decreased Fetal Movement and Rupture of Membranes   HPI   Event Date/Time   First Provider Initiated Contact with Patient 10/21/23 1634      Angela Woodard is a 33 y.o. [id number] at [redacted]w[redacted]d who presents to maternity admissions reporting concern for ROM. Patient reports she had a gush of fluid at 1530 and is still leaking. Denies any VB, Cramping, and endorses fetal movements. She reports a High Risk pregnancy and is followed at [facility].    Pregnancy Course: [facility]  Past Medical History:  Diagnosis Date   Arthritis    Asthma    Celiac disease    Depression    Ehlers-Danlos, hypermobile type    Normal echo 2021   Fibromyalgia    History of kidney stones    Mast cell activation syndrome (HCC)    Migraine    Nephropathy    OCD (obsessive compulsive disorder)    POTS (postural orthostatic tachycardia syndrome)    Pruritus    Normal bile acids 06/06/2021   Seizure disorder (HCC)    Last reported event 05/15/2021- during pt sleep   Urticaria    OB History  Gravida Para Term Preterm AB Living  4 2 2  1 2   SAB IAB Ectopic Multiple Live Births  1    2    # Outcome Date GA Lbr Len/2nd Weight Sex Type Anes PTL Lv  4 Current           3 Term 2023     Vag-Spont   LIV  2 Term 07/06/20     Vag-Spont  Y LIV  1 SAB            Past Surgical History:  Procedure Laterality Date   ADENOIDECTOMY     TYMPANOSTOMY     x3   TYMPANOSTOMY TUBE PLACEMENT     Family History  Problem Relation Age of Onset   Allergic rhinitis Mother    Hypertension Mother    Allergic rhinitis Father    Throat cancer Father    Melanoma Father    Urticaria Sister    Allergic rhinitis Sister    Anxiety disorder Sister    Memory loss Maternal Grandmother    Colon cancer Maternal Grandmother    Heart disease Paternal Grandfather    Lung cancer Paternal Grandfather    Obesity Paternal Grandfather    Diabetes Paternal Grandfather    Seizures  Neg Hx    Social History   Tobacco Use   Smoking status: Never    Passive exposure: Never   Smokeless tobacco: Never  Vaping Use   Vaping status: Former   Substances: CBD  Substance Use Topics   Alcohol use: Not Currently   Drug use: No   Allergies  Allergen Reactions   Relpax [Eletriptan] Anaphylaxis    Throat swelling   Sulfa Antibiotics Hives and Rash   Gluten Meal    Latex Hives   Lamotrigine Rash   Medications Prior to Admission  Medication Sig Dispense Refill Last Dose/Taking   cetirizine (ZYRTEC) 10 MG tablet Take 10 mg by mouth daily.   10/21/2023   cromolyn (GASTROCROM) 100 MG/5ML solution TAKE 5 ML(100 MG) BY MOUTH THREE TIMES DAILY BEFORE MEALS 480 mL 5 10/21/2023 Noon   famotidine (PEPCID) 20 MG tablet Take 20 mg by mouth in the morning, at noon, and at bedtime.   10/21/2023   gabapentin (NEURONTIN) 400 MG capsule Take 1 capsule (  400 mg total) by mouth 3 (three) times daily. 270 capsule 1 10/21/2023 Noon   magnesium oxide (MAG-OX) 400 MG tablet Take 200 mg by mouth daily.   10/20/2023 Bedtime   montelukast (SINGULAIR) 10 MG tablet Take 1 tablet (10 mg total) by mouth at bedtime. 90 tablet 0 Past Month   propranolol (INDERAL) 10 MG tablet Take 10 mg by mouth 3 (three) times daily.   10/21/2023 Noon   albuterol (VENTOLIN HFA) 108 (90 Base) MCG/ACT inhaler Inhale 2 puffs into the lungs every 6 (six) hours as needed for wheezing or shortness of breath. 8 g 2    amphetamine-dextroamphetamine (ADDERALL) 20 MG tablet Take 20 mg by mouth 2 (two) times daily. 20mg  in the morning and 10mg  at night (Patient not taking: Reported on 06/07/2023)      cefdinir (OMNICEF) 300 MG capsule Take 1 capsule (300 mg total) by mouth 2 (two) times daily. 20 capsule 0    cyclobenzaprine (FLEXERIL) 5 MG tablet TAKE 1 TABLET(5 MG) BY MOUTH AT BEDTIME AS NEEDED FOR MUSCLE SPASMS (Patient taking differently: Take 5 mg by mouth at bedtime.) 30 tablet 3    EPINEPHrine 0.3 mg/0.3 mL IJ SOAJ injection Inject  0.3 mg into the muscle as needed for anaphylaxis. 1 each 2    fluticasone (FLONASE) 50 MCG/ACT nasal spray Place 2 sprays into both nostrils daily. 16 g 0    midodrine (PROAMATINE) 2.5 MG tablet Take 2.5 mg by mouth 3 (three) times daily with meals. As needed for hypotension (Patient not taking: Reported on 10/21/2023)   Not Taking   NONFORMULARY OR COMPOUNDED ITEM Take 1-2 mg by mouth daily. Naltrexone (Patient not taking: Reported on 06/07/2023) 90 each 1    ondansetron (ZOFRAN-ODT) 4 MG disintegrating tablet Take 1 tablet (4 mg total) by mouth every 8 (eight) hours as needed. 20 tablet 0     I have reviewed patient's Past Medical Hx, Surgical Hx, Family Hx, Social Hx, medications and allergies.   ROS  Pertinent items noted in HPI and remainder of comprehensive ROS otherwise negative.   PHYSICAL EXAM  Patient Vitals for the past 24 hrs:  BP Temp Temp src Pulse Resp Height Weight  10/21/23 1632 (!) 114/52 -- -- 70 -- -- --  10/21/23 1621 (!) 107/55 98.3 F (36.8 C) Oral 61 16 -- --  10/21/23 1613 -- -- -- -- -- 5\' 4"  (1.626 m) 77.3 kg    Constitutional: Well-developed, well-nourished female in no acute distress.  Cardiovascular: normal rate & rhythm, warm and well-perfused Respiratory: normal effort, no problems with respiration noted GI: Abd soft, non-tender, gravid MS: Extremities nontender, no edema, normal ROM Neurologic: Alert and oriented x 4.  GU: no CVA tenderness Pelvic: Performed with RN Chaperone Nykeria  SSE:  no pooling, no increased vaginal discharge noted, and cervix is visually closed   I had patient cough during the SSE and no visible lof from cervical os     Fetal Tracing: Cat 1 reactive for GA  Baseline: 140 Variability: moderate  Accelerations: present Decelerations: absent Toco: quite   Labs: Results for orders placed or performed during the hospital encounter of 10/21/23 (from the past 24 hours)  Wet prep, genital     Status: Abnormal   Collection Time:  10/21/23  4:36 PM  Result Value Ref Range   Yeast Wet Prep HPF POC NONE SEEN NONE SEEN   Trich, Wet Prep NONE SEEN NONE SEEN   Clue Cells Wet Prep HPF POC NONE SEEN  NONE SEEN   WBC, Wet Prep HPF POC >=10 (A) <10   Sperm NONE SEEN   Rupture of Membrane (ROM) Plus     Status: None   Collection Time: 10/21/23  4:36 PM  Result Value Ref Range   Rom Plus NEGATIVE   Fern Test     Status: None   Collection Time: 10/21/23  5:17 PM  Result Value Ref Range   POCT Fern Test Negative = intact amniotic membranes     Imaging:  No results found.  MDM & MAU COURSE  MDM:  HIGH  MAU Course: Orders Placed This Encounter  Procedures   Wet prep, genital   Culture, beta strep (group b only)   Rupture of Membrane (ROM) Plus   Urinalysis, Routine w reflex microscopic -Urine, Clean Catch   Fern Test   Discharge patient Discharge disposition: 01-Home or Self Care; Discharge patient date: 10/21/2023    I have reviewed the patient chart and performed the physical exam . I have ordered & interpreted the lab results and reviewed and interpreted the NST Medications ordered as stated below.  A/P as described below.  Counseling and education provided and patient agreeable  with plan as described below. Verbalized understanding.    ASSESSMENT   1. Intact amniotic membranes during pregnancy in third trimester   2. [redacted] weeks gestation of pregnancy   3. Decreased fetal movement during pregnancy, antepartum, single or unspecified fetus   4. NST (non-stress test) reactive on fetal surveillance     PLAN  Discharge home in stable condition with return precautions.   See AVS for full description of information given to the patient including both verbal and written. Patient verbalized understanding and agrees with the plan as described above.     Allergies as of 10/21/2023       Reactions   Relpax [eletriptan] Anaphylaxis   Throat swelling   Sulfa Antibiotics Hives, Rash   Gluten Meal    Latex Hives    Lamotrigine Rash        Medication List     TAKE these medications    albuterol 108 (90 Base) MCG/ACT inhaler Commonly known as: VENTOLIN HFA Inhale 2 puffs into the lungs every 6 (six) hours as needed for wheezing or shortness of breath.   amphetamine-dextroamphetamine 20 MG tablet Commonly known as: ADDERALL Take 20 mg by mouth 2 (two) times daily. 20mg  in the morning and 10mg  at night   cefdinir 300 MG capsule Commonly known as: OMNICEF Take 1 capsule (300 mg total) by mouth 2 (two) times daily.   cetirizine 10 MG tablet Commonly known as: ZYRTEC Take 10 mg by mouth daily.   cromolyn 100 MG/5ML solution Commonly known as: GASTROCROM TAKE 5 ML(100 MG) BY MOUTH THREE TIMES DAILY BEFORE MEALS   cyclobenzaprine 5 MG tablet Commonly known as: FLEXERIL TAKE 1 TABLET(5 MG) BY MOUTH AT BEDTIME AS NEEDED FOR MUSCLE SPASMS What changed: See the new instructions.   EPINEPHrine 0.3 mg/0.3 mL Soaj injection Commonly known as: EPI-PEN Inject 0.3 mg into the muscle as needed for anaphylaxis.   famotidine 20 MG tablet Commonly known as: PEPCID Take 20 mg by mouth in the morning, at noon, and at bedtime.   fluticasone 50 MCG/ACT nasal spray Commonly known as: FLONASE Place 2 sprays into both nostrils daily.   gabapentin 400 MG capsule Commonly known as: NEURONTIN Take 1 capsule (400 mg total) by mouth 3 (three) times daily.   magnesium oxide 400 MG tablet Commonly  known as: MAG-OX Take 200 mg by mouth daily.   midodrine 2.5 MG tablet Commonly known as: PROAMATINE Take 2.5 mg by mouth 3 (three) times daily with meals. As needed for hypotension   montelukast 10 MG tablet Commonly known as: SINGULAIR Take 1 tablet (10 mg total) by mouth at bedtime.   NONFORMULARY OR COMPOUNDED ITEM Take 1-2 mg by mouth daily. Naltrexone   ondansetron 4 MG disintegrating tablet Commonly known as: ZOFRAN-ODT Take 1 tablet (4 mg total) by mouth every 8 (eight) hours as needed.    propranolol 10 MG tablet Commonly known as: INDERAL Take 10 mg by mouth 3 (three) times daily.        Debbe Fail, MSN, [facility] [employer]

## 2023-10-21 NOTE — Discharge Instructions (Signed)
 Follow up with your [facility] Provider

## 2023-10-22 LAB — GC/CHLAMYDIA PROBE AMP (~~LOC~~) NOT AT ARMC
Chlamydia: NEGATIVE
Comment: NEGATIVE
Comment: NORMAL
Neisseria Gonorrhea: NEGATIVE

## 2023-10-27 ENCOUNTER — Telehealth: Admitting: Physician Assistant

## 2023-10-27 DIAGNOSIS — B9689 Other specified bacterial agents as the cause of diseases classified elsewhere: Secondary | ICD-10-CM | POA: Diagnosis not present

## 2023-10-27 DIAGNOSIS — J019 Acute sinusitis, unspecified: Secondary | ICD-10-CM

## 2023-10-27 MED ORDER — AMOXICILLIN 875 MG PO TABS: 875.0000 mg | ORAL_TABLET | Freq: Two times a day (BID) | ORAL | 0 refills | Status: DC

## 2023-10-27 NOTE — Progress Notes (Signed)
 E-Visit for Sinus Problems  We are sorry that you are not feeling well.  Here is how we plan to help!  Based on what you have shared with me it looks like you have sinusitis.  Sinusitis is inflammation and infection in the sinus cavities of the head.  Based on your presentation I believe you most likely have Acute Bacterial Sinusitis.  This is an infection caused by bacteria and is treated with antibiotics. I have prescribed Amoxicillin  875 mg to take twice daily for 7 days,.    You may use plain Mucinex. Saline nasal spray help and can safely be used as often as needed for congestion.  If you develop worsening sinus pain, fever or notice severe headache and vision changes, or if symptoms are not better after completion of antibiotic, please schedule an appointment with a health care provider.    Sinus infections are not as easily transmitted as other respiratory infection, however we still recommend that you avoid close contact with loved ones, especially the very young and elderly.  Remember to wash your hands thoroughly throughout the day as this is the number one way to prevent the spread of infection!  Home Care: Only take medications as instructed by your medical team. Complete the entire course of an antibiotic. Do not take these medications with alcohol. A steam or ultrasonic humidifier can help congestion.  You can place a towel over your head and breathe in the steam from hot water coming from a faucet. Avoid close contacts especially the very young and the elderly. Cover your mouth when you cough or sneeze. Always remember to wash your hands.  Get Help Right Away If: You develop worsening fever or sinus pain. You develop a severe head ache or visual changes. Your symptoms persist after you have completed your treatment plan.  Make sure you Understand these instructions. Will watch your condition. Will get help right away if you are not doing well or get worse.  Thank you for  choosing an e-visit.  Your e-visit answers were reviewed by a board certified advanced clinical practitioner to complete your personal care plan. Depending upon the condition, your plan could have included both over the counter or prescription medications.  Please review your pharmacy choice. Make sure the pharmacy is open so you can pick up prescription now. If there is a problem, you may contact your provider through [employer] and have the prescription routed to another pharmacy.  Your safety is important to us . If you have drug allergies check your prescription carefully.   For the next 24 hours you can use MyChart to ask questions about today's visit, request a non-urgent call back, or ask for a work or school excuse. You will get an email in the next two days asking about your experience. I hope that your e-visit has been valuable and will speed your recovery.

## 2023-10-27 NOTE — Progress Notes (Signed)
 I have spent 5 minutes in review of e-visit questionnaire, review and updating patient chart, medical decision making and response to patient.   Piedad Climes, PA-C

## 2023-11-04 NOTE — Progress Notes (Deleted)
  Angela Woodard [address] [place] [address] Phone: [phone] Subjective:    I'Angela seeing this patient by the request  of:  Dorrene Gaucher, DO  CC:   [id number]  Angela Woodard is a 33 y.o. female coming in with complaint of back and neck pain. OMT July 2024. Patient states   Medications patient has been prescribed: None  Taking:         Reviewed prior external information including notes and imaging from previsou exam, outside providers and external EMR if available.   As well as notes that were available from care everywhere and other healthcare systems.  Past medical history, social, surgical and family history all reviewed in electronic medical record.  No pertanent information unless stated regarding to the chief complaint.   Past Medical History:  Diagnosis Date   Arthritis    Asthma    Celiac disease    Depression    Ehlers-Danlos, hypermobile type    Normal echo 2021   Fibromyalgia    History of kidney stones    Mast cell activation syndrome (HCC)    Migraine    Nephropathy    OCD (obsessive compulsive disorder)    POTS (postural orthostatic tachycardia syndrome)    Pruritus    Normal bile acids 06/06/2021   Seizure disorder (HCC)    Last reported event 05/15/2021- during pt sleep   Urticaria     Allergies  Allergen Reactions   Relpax [Eletriptan] Anaphylaxis    Throat swelling   Sulfa Antibiotics Hives and Rash   Gluten Meal    Latex Hives   Lamotrigine Rash     Review of Systems:  No headache, visual changes, nausea, vomiting, diarrhea, constipation, dizziness, abdominal pain, skin rash, fevers, chills, night sweats, weight loss, swollen lymph nodes, body aches, joint swelling, chest pain, shortness of breath, mood changes. POSITIVE muscle aches  Objective  Last menstrual period 05/02/2023.   General: No apparent distress alert and oriented x3 mood and affect normal, dressed appropriately.   HEENT: Pupils equal, extraocular movements intact  Respiratory: Patient's speak in full sentences and does not appear short of breath  Cardiovascular: No lower extremity edema, non tender, no erythema  Gait MSK:  Back   Osteopathic findings  C2 flexed rotated and side bent right C6 flexed rotated and side bent left T3 extended rotated and side bent right inhaled rib T9 extended rotated and side bent left L2 flexed rotated and side bent right Sacrum right on right       Assessment and Plan:  No problem-specific Assessment & Plan notes found for this encounter.    Nonallopathic problems  Decision today to treat with OMT was based on Physical Exam  After verbal consent patient was treated with HVLA, ME, FPR techniques in cervical, rib, thoracic, lumbar, and sacral  areas  Patient tolerated the procedure well with improvement in symptoms  Patient given exercises, stretches and lifestyle modifications  See medications in patient instructions if given  Patient will follow up in 4-8 weeks    The above documentation has been reviewed and is accurate and complete Angela Woodard Angela [name], DO          Note: This dictation was prepared with Dragon dictation along with smaller phrase technology. Any transcriptional errors that result from this process are unintentional.

## 2023-11-05 ENCOUNTER — Ambulatory Visit: Admitting: Family Medicine

## 2023-11-08 ENCOUNTER — Encounter (HOSPITAL_COMMUNITY): Payer: Self-pay | Admitting: Obstetrics & Gynecology

## 2023-11-08 ENCOUNTER — Observation Stay (HOSPITAL_COMMUNITY)
Admission: AD | Admit: 2023-11-08 | Discharge: 2023-11-10 | Disposition: A | Discharge status: Home / Self Care | Attending: Obstetrics & Gynecology | Admitting: Obstetrics & Gynecology

## 2023-11-08 ENCOUNTER — Inpatient Hospital Stay (HOSPITAL_COMMUNITY)

## 2023-11-08 DIAGNOSIS — G8929 Other chronic pain: Secondary | ICD-10-CM | POA: Diagnosis present

## 2023-11-08 DIAGNOSIS — Z79899 Other long term (current) drug therapy: Secondary | ICD-10-CM

## 2023-11-08 DIAGNOSIS — M797 Fibromyalgia: Secondary | ICD-10-CM | POA: Diagnosis present

## 2023-11-08 DIAGNOSIS — K5900 Constipation, unspecified: Secondary | ICD-10-CM | POA: Diagnosis not present

## 2023-11-08 DIAGNOSIS — Z833 Family history of diabetes mellitus: Secondary | ICD-10-CM

## 2023-11-08 DIAGNOSIS — K921 Melena: Principal | ICD-10-CM | POA: Diagnosis present

## 2023-11-08 DIAGNOSIS — O99612 Diseases of the digestive system complicating pregnancy, second trimester: Secondary | ICD-10-CM | POA: Insufficient documentation

## 2023-11-08 DIAGNOSIS — J45909 Unspecified asthma, uncomplicated: Secondary | ICD-10-CM | POA: Insufficient documentation

## 2023-11-08 DIAGNOSIS — R1084 Generalized abdominal pain: Secondary | ICD-10-CM | POA: Diagnosis not present

## 2023-11-08 DIAGNOSIS — O99352 Diseases of the nervous system complicating pregnancy, second trimester: Principal | ICD-10-CM | POA: Diagnosis present

## 2023-11-08 DIAGNOSIS — O99512 Diseases of the respiratory system complicating pregnancy, second trimester: Secondary | ICD-10-CM | POA: Insufficient documentation

## 2023-11-08 DIAGNOSIS — R195 Other fecal abnormalities: Secondary | ICD-10-CM

## 2023-11-08 DIAGNOSIS — O26892 Other specified pregnancy related conditions, second trimester: Secondary | ICD-10-CM | POA: Insufficient documentation

## 2023-11-08 DIAGNOSIS — O209 Hemorrhage in early pregnancy, unspecified: Secondary | ICD-10-CM | POA: Diagnosis present

## 2023-11-08 DIAGNOSIS — Z8249 Family history of ischemic heart disease and other diseases of the circulatory system: Secondary | ICD-10-CM

## 2023-11-08 DIAGNOSIS — K559 Vascular disorder of intestine, unspecified: Secondary | ICD-10-CM | POA: Diagnosis present

## 2023-11-08 DIAGNOSIS — Z91040 Latex allergy status: Secondary | ICD-10-CM | POA: Insufficient documentation

## 2023-11-08 DIAGNOSIS — Z3A27 27 weeks gestation of pregnancy: Secondary | ICD-10-CM | POA: Diagnosis not present

## 2023-11-08 DIAGNOSIS — G90A Postural orthostatic tachycardia syndrome (POTS): Secondary | ICD-10-CM | POA: Diagnosis present

## 2023-11-08 DIAGNOSIS — O26899 Other specified pregnancy related conditions, unspecified trimester: Secondary | ICD-10-CM

## 2023-11-08 DIAGNOSIS — R109 Unspecified abdominal pain: Secondary | ICD-10-CM

## 2023-11-08 DIAGNOSIS — Z3689 Encounter for other specified antenatal screening: Secondary | ICD-10-CM

## 2023-11-08 LAB — URINALYSIS, ROUTINE W REFLEX MICROSCOPIC
Bacteria, UA: NONE SEEN
Bilirubin Urine: NEGATIVE
Glucose, UA: NEGATIVE mg/dL
Ketones, ur: NEGATIVE mg/dL
Leukocytes,Ua: NEGATIVE
Nitrite: NEGATIVE
Protein, ur: NEGATIVE mg/dL
Specific Gravity, Urine: 1.002 — ABNORMAL LOW (ref 1.005–1.030)
pH: 7 (ref 5.0–8.0)

## 2023-11-08 LAB — COMPREHENSIVE METABOLIC PANEL WITH GFR
ALT: 10 U/L (ref 0–44)
AST: 15 U/L (ref 15–41)
Albumin: 2.9 g/dL — ABNORMAL LOW (ref 3.5–5.0)
Alkaline Phosphatase: 46 U/L (ref 38–126)
Anion gap: 8 (ref 5–15)
BUN: 8 mg/dL (ref 6–20)
CO2: 21 mmol/L — ABNORMAL LOW (ref 22–32)
Calcium: 8.8 mg/dL — ABNORMAL LOW (ref 8.9–10.3)
Chloride: 106 mmol/L (ref 98–111)
Creatinine, Ser: 0.58 mg/dL (ref 0.44–1.00)
GFR, Estimated: >60 mL/min (ref >60)
Glucose, Bld: 92 mg/dL (ref 70–99)
Potassium: 3.7 mmol/L (ref 3.5–5.1)
Sodium: 135 mmol/L (ref 135–145)
Total Bilirubin: 0.4 mg/dL (ref 0.0–1.2)
Total Protein: 6 g/dL — ABNORMAL LOW (ref 6.5–8.1)

## 2023-11-08 LAB — CBC
HCT: 34.7 % — ABNORMAL LOW (ref 36.0–46.0)
Hemoglobin: 11.6 g/dL — ABNORMAL LOW (ref 12.0–15.0)
MCH: 33 pg (ref 26.0–34.0)
MCHC: 33.4 g/dL (ref 30.0–36.0)
MCV: 98.6 fL (ref 80.0–100.0)
Platelets: 216 10*3/uL (ref 150–400)
RBC: 3.52 MIL/uL — ABNORMAL LOW (ref 3.87–5.11)
RDW: 12.8 % (ref 11.5–15.5)
WBC: 13.2 10*3/uL — ABNORMAL HIGH (ref 4.0–10.5)
nRBC: 0 % (ref 0.0–0.2)

## 2023-11-08 NOTE — MAU Provider Note (Incomplete Revision)
 Chief Complaint:  Nausea and Abdominal Pain   Event Date/Time   First Provider Initiated Contact with Patient 11/08/23 1804      HPI: Angela Woodard is a 33 y.o. [id number] at [redacted]w[redacted]d who presents to maternity admissions reporting onset of severe lower abdominal pain at 0300 this morning, followed by a normal bowel movement, then rectal bleeding/bloody mucus.  There is only bloody mucus now, and the pain has improved but not resolved. She describes the pain as "twisting" pain and still feels like something is stuck and won't come out.  Prior to pregnancy, she was being evaluated by [facility] for bowel issues including bloody stools, but never as much blood as today.  She plans to f/u with [facility] but does not yet have an appointment.   She reports good fetal movement, denies contractions, or other OB concerns.     HPI  Past Medical History: Past Medical History:  Diagnosis Date   Arthritis    Asthma    Celiac disease    Depression    Ehlers-Danlos, hypermobile type    Normal echo 2021   Fibromyalgia    History of kidney stones    Mast cell activation syndrome (HCC)    Migraine    Nephropathy    OCD (obsessive compulsive disorder)    POTS (postural orthostatic tachycardia syndrome)    Pruritus    Normal bile acids 06/06/2021   Seizure disorder (HCC)    Last reported event 05/15/2021- during pt sleep   Urticaria     Past obstetric history: OB History  Gravida Para Term Preterm AB Living  4 2 2  1 2   SAB IAB Ectopic Multiple Live Births  1    2    # Outcome Date GA Lbr Len/2nd Weight Sex Type Anes PTL Lv  4 Current           3 Term 2023     Vag-Spont   LIV  2 Term 07/06/20     Vag-Spont  Y LIV  1 SAB             Past Surgical History: Past Surgical History:  Procedure Laterality Date   ADENOIDECTOMY     TYMPANOSTOMY     x3   TYMPANOSTOMY TUBE PLACEMENT      Family History: Family History  Problem Relation Age of Onset   Allergic rhinitis Mother    Hypertension  Mother    Allergic rhinitis Father    Throat cancer Father    Melanoma Father    Urticaria Sister    Allergic rhinitis Sister    Anxiety disorder Sister    Memory loss Maternal Grandmother    Colon cancer Maternal Grandmother    Heart disease Paternal Grandfather    Lung cancer Paternal Grandfather    Obesity Paternal Grandfather    Diabetes Paternal Grandfather    Seizures Neg Hx     Social History: Social History   Tobacco Use   Smoking status: Never    Passive exposure: Never   Smokeless tobacco: Never  Vaping Use   Vaping status: Former   Substances: CBD  Substance Use Topics   Alcohol use: Not Currently   Drug use: No    Allergies:  Allergies  Allergen Reactions   Relpax [Eletriptan] Anaphylaxis    Throat swelling   Sulfa Antibiotics Hives and Rash   Gluten Meal    Latex Hives   Lamotrigine Rash    Meds:  Medications Prior to Admission  Medication Sig Dispense Refill Last Dose/Taking   albuterol  (VENTOLIN  HFA) 108 (90 Base) MCG/ACT inhaler Inhale 2 puffs into the lungs every 6 (six) hours as needed for wheezing or shortness of breath. 8 g 2 Past Month   amphetamine-dextroamphetamine (ADDERALL) 20 MG tablet Take 20 mg by mouth 2 (two) times daily. 20mg  in the morning and 10mg  at night   11/08/2023   cetirizine (ZYRTEC) 10 MG tablet Take 10 mg by mouth daily.   11/08/2023   cromolyn  (GASTROCROM ) 100 MG/5ML solution TAKE 5 ML(100 MG) BY MOUTH THREE TIMES DAILY BEFORE MEALS 480 mL 5 11/07/2023   famotidine (PEPCID) 20 MG tablet Take 20 mg by mouth in the morning, at noon, and at bedtime.   11/08/2023   fluticasone  (FLONASE ) 50 MCG/ACT nasal spray Place 2 sprays into both nostrils daily. 16 g 0 11/08/2023   gabapentin  (NEURONTIN ) 400 MG capsule Take 1 capsule (400 mg total) by mouth 3 (three) times daily. 270 capsule 1 11/08/2023   magnesium oxide (MAG-OX) 400 MG tablet Take 200 mg by mouth daily.   11/07/2023   montelukast  (SINGULAIR ) 10 MG tablet Take 1 tablet (10 mg total)  by mouth at bedtime. 90 tablet 0 11/07/2023   NONFORMULARY OR COMPOUNDED ITEM Take 1-2 mg by mouth daily. Naltrexone 90 each 1 11/08/2023   propranolol (INDERAL) 10 MG tablet Take 10 mg by mouth 3 (three) times daily.   11/08/2023   amoxicillin  (AMOXIL ) 875 MG tablet Take 1 tablet (875 mg total) by mouth 2 (two) times daily. 14 tablet 0    cyclobenzaprine  (FLEXERIL ) 5 MG tablet TAKE 1 TABLET(5 MG) BY MOUTH AT BEDTIME AS NEEDED FOR MUSCLE SPASMS (Patient taking differently: Take 5 mg by mouth at bedtime.) 30 tablet 3    EPINEPHrine  0.3 mg/0.3 mL IJ SOAJ injection Inject 0.3 mg into the muscle as needed for anaphylaxis. 1 each 2    midodrine (PROAMATINE) 2.5 MG tablet Take 2.5 mg by mouth 3 (three) times daily with meals. As needed for hypotension (Patient not taking: Reported on 10/21/2023)   More than a month   ondansetron  (ZOFRAN -ODT) 4 MG disintegrating tablet Take 1 tablet (4 mg total) by mouth every 8 (eight) hours as needed. 20 tablet 0 More than a month    ROS:  Review of Systems  Constitutional:  Negative for chills, fatigue and fever.  Eyes:  Negative for visual disturbance.  Respiratory:  Negative for shortness of breath.   Cardiovascular:  Negative for chest pain.  Gastrointestinal:  Positive for abdominal pain, anal bleeding, blood in stool, constipation and nausea. Negative for vomiting.  Genitourinary:  Negative for difficulty urinating, dysuria, flank pain, pelvic pain, vaginal bleeding, vaginal discharge and vaginal pain.  Neurological:  Negative for dizziness and headaches.  Psychiatric/Behavioral: Negative.       I have reviewed patient's Past Medical Hx, Surgical Hx, Family Hx, Social Hx, medications and allergies.   Physical Exam  Patient Vitals for the past 24 hrs:  BP Temp Temp src Pulse Resp SpO2 Height Weight  11/08/23 1817 109/68 -- -- 78 -- -- -- --  11/08/23 1800 (!) 117/57 -- -- 84 -- -- -- --  11/08/23 1745 110/62 -- -- 84 -- -- -- --  11/08/23 1730 114/74 -- -- 84  -- -- -- --  11/08/23 1714 110/68 98.2 F (36.8 C) Oral 77 18 99 % -- --  11/08/23 1703 -- -- -- -- -- -- 5\' 4"  (1.626 m) 78.8 kg   Constitutional: Well-developed, well-nourished  female in no acute distress.  Cardiovascular: normal rate Respiratory: normal effort GI: Abd soft, non-tender, gravid appropriate for gestational age.  MS: Extremities nontender, no edema, normal ROM Neurologic: Alert and oriented x 4.  GU: Neg CVAT.  PELVIC EXAM:  Dilation: Closed Effacement (%): Thick Station: Ballotable Exam by:: Lizbeth Right, CNM  FHT:  Baseline 135 , moderate variability, accelerations present, no decelerations Contractions: none on toco or to palpation   Labs: Results for orders placed or performed during the hospital encounter of 11/08/23 (from the past 24 hours)  Urinalysis, Routine w reflex microscopic -Urine, Clean Catch     Status: Abnormal   Collection Time: 11/08/23  5:04 PM  Result Value Ref Range   Color, Urine COLORLESS (A) YELLOW   APPearance CLEAR CLEAR   Specific Gravity, Urine 1.002 (L) 1.005 - 1.030   pH 7.0 5.0 - 8.0   Glucose, UA NEGATIVE NEGATIVE mg/dL   Hgb urine dipstick SMALL (A) NEGATIVE   Bilirubin Urine NEGATIVE NEGATIVE   Ketones, ur NEGATIVE NEGATIVE mg/dL   Protein, ur NEGATIVE NEGATIVE mg/dL   Nitrite NEGATIVE NEGATIVE   Leukocytes,Ua NEGATIVE NEGATIVE   RBC / HPF 0-5 0 - 5 RBC/hpf   WBC, UA 0-5 0 - 5 WBC/hpf   Bacteria, UA NONE SEEN NONE SEEN   Squamous Epithelial / HPF 0-5 0 - 5 /HPF      Imaging:  No results found.  MAU Course/MDM: Orders Placed This Encounter  Procedures   Gastrointestinal Panel by PCR , Stool   Calprotectin, Fecal   MR ABDOMEN WO CONTRAST   MR PELVIS WO CONTRAST   Urinalysis, Routine w reflex microscopic -Urine, Clean Catch   CBC   Comprehensive metabolic panel   Enteric precautions (UV disinfection) C difficile, Norovirus    No orders of the defined types were placed in this encounter.   Reviewed pt  previous imaging, including abdominal MRI in 2024 and mesenteric complete 06/2023.    NST reviewed and appropriate for gestational age Cervix closed, no evidence of PTL Consult GI, spoke with Dr General Kenner who recommended CBC to evaluate WBCs, GI pathogen panel, fecal calprotectin, and consider imaging if pain severe, or WBCs elevated. Discussed MRI vs CT vs KUB and recommendation to order MRI if imaging warranted.  If no acute findings, pt would prefer referral to  GI for close follow up  Report to Dr Daisey Dryer, with labs and imaging pending  Arlester Bence Certified Nurse-Midwife 11/08/2023 8:19 PM   10:51 PM Spoke with Dr Violeta Grey no acute appendicitis or other concerning features. He will preliminary tonight and push to daytime radiologist..   Assessment and Plan:

## 2023-11-08 NOTE — MAU Provider Note (Cosign Needed)
 Chief Complaint:  Nausea and Abdominal Pain   Event Date/Time   First Provider Initiated Contact with Patient 11/08/23 1804      HPI: Angela Woodard is a 33 y.o. [id number] at [redacted]w[redacted]d who presents to maternity admissions reporting onset of severe lower abdominal pain at 0300 this morning, followed by a normal bowel movement, then rectal bleeding/bloody mucus.  There is only bloody mucus now, and the pain has improved but not resolved. She describes the pain as "twisting" pain and still feels like something is stuck and won't come out.  Prior to pregnancy, she was being evaluated by [facility] for bowel issues including bloody stools, but never as much blood as today.  She plans to f/u with [facility] but does not yet have an appointment.   She reports good fetal movement, denies contractions, or other OB concerns.     HPI  Past Medical History: Past Medical History:  Diagnosis Date   Arthritis    Asthma    Celiac disease    Depression    Ehlers-Danlos, hypermobile type    Normal echo 2021   Fibromyalgia    History of kidney stones    Mast cell activation syndrome (HCC)    Migraine    Nephropathy    OCD (obsessive compulsive disorder)    POTS (postural orthostatic tachycardia syndrome)    Pruritus    Normal bile acids 06/06/2021   Seizure disorder (HCC)    Last reported event 05/15/2021- during pt sleep   Urticaria     Past obstetric history: OB History  Gravida Para Term Preterm AB Living  4 2 2  1 2   SAB IAB Ectopic Multiple Live Births  1    2    # Outcome Date GA Lbr Len/2nd Weight Sex Type Anes PTL Lv  4 Current           3 Term 2023     Vag-Spont   LIV  2 Term 07/06/20     Vag-Spont  Y LIV  1 SAB             Past Surgical History: Past Surgical History:  Procedure Laterality Date   ADENOIDECTOMY     TYMPANOSTOMY     x3   TYMPANOSTOMY TUBE PLACEMENT      Family History: Family History  Problem Relation Age of Onset   Allergic rhinitis Mother    Hypertension  Mother    Allergic rhinitis Father    Throat cancer Father    Melanoma Father    Urticaria Sister    Allergic rhinitis Sister    Anxiety disorder Sister    Memory loss Maternal Grandmother    Colon cancer Maternal Grandmother    Heart disease Paternal Grandfather    Lung cancer Paternal Grandfather    Obesity Paternal Grandfather    Diabetes Paternal Grandfather    Seizures Neg Hx     Social History: Social History   Tobacco Use   Smoking status: Never    Passive exposure: Never   Smokeless tobacco: Never  Vaping Use   Vaping status: Former   Substances: CBD  Substance Use Topics   Alcohol use: Not Currently   Drug use: No    Allergies:  Allergies  Allergen Reactions   Relpax [Eletriptan] Anaphylaxis    Throat swelling   Sulfa Antibiotics Hives and Rash   Gluten Meal    Latex Hives   Lamotrigine Rash    Meds:  Medications Prior to Admission  Medication Sig Dispense Refill Last Dose/Taking   albuterol  (VENTOLIN  HFA) 108 (90 Base) MCG/ACT inhaler Inhale 2 puffs into the lungs every 6 (six) hours as needed for wheezing or shortness of breath. 8 g 2 Past Month   amphetamine-dextroamphetamine (ADDERALL) 20 MG tablet Take 20 mg by mouth 2 (two) times daily. 20mg  in the morning and 10mg  at night   11/08/2023   cetirizine (ZYRTEC) 10 MG tablet Take 10 mg by mouth daily.   11/08/2023   cromolyn  (GASTROCROM ) 100 MG/5ML solution TAKE 5 ML(100 MG) BY MOUTH THREE TIMES DAILY BEFORE MEALS 480 mL 5 11/07/2023   famotidine (PEPCID) 20 MG tablet Take 20 mg by mouth in the morning, at noon, and at bedtime.   11/08/2023   fluticasone  (FLONASE ) 50 MCG/ACT nasal spray Place 2 sprays into both nostrils daily. 16 g 0 11/08/2023   gabapentin  (NEURONTIN ) 400 MG capsule Take 1 capsule (400 mg total) by mouth 3 (three) times daily. 270 capsule 1 11/08/2023   magnesium oxide (MAG-OX) 400 MG tablet Take 200 mg by mouth daily.   11/07/2023   montelukast  (SINGULAIR ) 10 MG tablet Take 1 tablet (10 mg total)  by mouth at bedtime. 90 tablet 0 11/07/2023   NONFORMULARY OR COMPOUNDED ITEM Take 1-2 mg by mouth daily. Naltrexone 90 each 1 11/08/2023   propranolol (INDERAL) 10 MG tablet Take 10 mg by mouth 3 (three) times daily.   11/08/2023   amoxicillin  (AMOXIL ) 875 MG tablet Take 1 tablet (875 mg total) by mouth 2 (two) times daily. 14 tablet 0    cyclobenzaprine  (FLEXERIL ) 5 MG tablet TAKE 1 TABLET(5 MG) BY MOUTH AT BEDTIME AS NEEDED FOR MUSCLE SPASMS (Patient taking differently: Take 5 mg by mouth at bedtime.) 30 tablet 3    EPINEPHrine  0.3 mg/0.3 mL IJ SOAJ injection Inject 0.3 mg into the muscle as needed for anaphylaxis. 1 each 2    midodrine (PROAMATINE) 2.5 MG tablet Take 2.5 mg by mouth 3 (three) times daily with meals. As needed for hypotension (Patient not taking: Reported on 10/21/2023)   More than a month   ondansetron  (ZOFRAN -ODT) 4 MG disintegrating tablet Take 1 tablet (4 mg total) by mouth every 8 (eight) hours as needed. 20 tablet 0 More than a month    ROS:  Review of Systems  Constitutional:  Negative for chills, fatigue and fever.  Eyes:  Negative for visual disturbance.  Respiratory:  Negative for shortness of breath.   Cardiovascular:  Negative for chest pain.  Gastrointestinal:  Positive for abdominal pain, anal bleeding, blood in stool, constipation and nausea. Negative for vomiting.  Genitourinary:  Negative for difficulty urinating, dysuria, flank pain, pelvic pain, vaginal bleeding, vaginal discharge and vaginal pain.  Neurological:  Negative for dizziness and headaches.  Psychiatric/Behavioral: Negative.       I have reviewed patient's Past Medical Hx, Surgical Hx, Family Hx, Social Hx, medications and allergies.   Physical Exam  Patient Vitals for the past 24 hrs:  BP Temp Temp src Pulse Resp SpO2 Height Weight  11/08/23 1817 109/68 -- -- 78 -- -- -- --  11/08/23 1800 (!) 117/57 -- -- 84 -- -- -- --  11/08/23 1745 110/62 -- -- 84 -- -- -- --  11/08/23 1730 114/74 -- -- 84  -- -- -- --  11/08/23 1714 110/68 98.2 F (36.8 C) Oral 77 18 99 % -- --  11/08/23 1703 -- -- -- -- -- -- 5\' 4"  (1.626 m) 78.8 kg   Constitutional: Well-developed, well-nourished  female in no acute distress.  Cardiovascular: normal rate Respiratory: normal effort GI: Abd soft, non-tender, gravid appropriate for gestational age.  MS: Extremities nontender, no edema, normal ROM Neurologic: Alert and oriented x 4.  GU: Neg CVAT.  PELVIC EXAM:  Dilation: Closed Effacement (%): Thick Station: Ballotable Exam by:: Lizbeth Right, CNM  FHT:  Baseline 135 , moderate variability, accelerations present, no decelerations Contractions: none on toco or to palpation   Labs: Results for orders placed or performed during the hospital encounter of 11/08/23 (from the past 24 hours)  Urinalysis, Routine w reflex microscopic -Urine, Clean Catch     Status: Abnormal   Collection Time: 11/08/23  5:04 PM  Result Value Ref Range   Color, Urine COLORLESS (A) YELLOW   APPearance CLEAR CLEAR   Specific Gravity, Urine 1.002 (L) 1.005 - 1.030   pH 7.0 5.0 - 8.0   Glucose, UA NEGATIVE NEGATIVE mg/dL   Hgb urine dipstick SMALL (A) NEGATIVE   Bilirubin Urine NEGATIVE NEGATIVE   Ketones, ur NEGATIVE NEGATIVE mg/dL   Protein, ur NEGATIVE NEGATIVE mg/dL   Nitrite NEGATIVE NEGATIVE   Leukocytes,Ua NEGATIVE NEGATIVE   RBC / HPF 0-5 0 - 5 RBC/hpf   WBC, UA 0-5 0 - 5 WBC/hpf   Bacteria, UA NONE SEEN NONE SEEN   Squamous Epithelial / HPF 0-5 0 - 5 /HPF      Imaging:  No results found.  MAU Course/MDM: Orders Placed This Encounter  Procedures   Gastrointestinal Panel by PCR , Stool   Calprotectin, Fecal   MR ABDOMEN WO CONTRAST   MR PELVIS WO CONTRAST   Urinalysis, Routine w reflex microscopic -Urine, Clean Catch   CBC   Comprehensive metabolic panel   Enteric precautions (UV disinfection) C difficile, Norovirus    No orders of the defined types were placed in this encounter.   Reviewed pt  previous imaging, including abdominal MRI in 2024 and mesenteric complete 06/2023.    NST reviewed and appropriate for gestational age Cervix closed, no evidence of PTL Consult GI, spoke with Dr General Kenner who recommended CBC to evaluate WBCs, GI pathogen panel, fecal calprotectin, and consider imaging if pain severe, or WBCs elevated. Discussed MRI vs CT vs KUB and recommendation to order MRI if imaging warranted.  If no acute findings, pt would prefer referral to [facility] for close follow up  Report to Dr Daisey Dryer, with labs and imaging pending   Arlester Bence Certified Nurse-Midwife 11/08/2023 8:19 PM

## 2023-11-08 NOTE — MAU Provider Note (Incomplete)
 Chief Complaint:  Nausea and Abdominal Pain   Event Date/Time   First Provider Initiated Contact with Patient 11/08/23 1804      HPI: Angela Woodard is a 33 y.o. [id number] at [redacted]w[redacted]d who presents to maternity admissions reporting onset of severe lower abdominal pain at 0300 this morning, followed by a normal bowel movement, then rectal bleeding/bloody mucus.  There is only bloody mucus now, and the pain has improved but not resolved. She describes the pain as "twisting" pain and still feels like something is stuck and won't come out.  Prior to pregnancy, she was being evaluated by [facility] for bowel issues including bloody stools, but never as much blood as today.  She plans to f/u with [facility] but does not yet have an appointment.   She reports good fetal movement, denies contractions, or other OB concerns.       Past Medical History: Past Medical History:  Diagnosis Date  . Arthritis   . Asthma   . Celiac disease   . Depression   . Ehlers-Danlos, hypermobile type    Normal echo 2021  . Fibromyalgia   . History of kidney stones   . Mast cell activation syndrome (HCC)   . Migraine   . Nephropathy   . OCD (obsessive compulsive disorder)   . POTS (postural orthostatic tachycardia syndrome)   . Pruritus    Normal bile acids 06/06/2021  . Seizure disorder (HCC)    Last reported event 05/15/2021- during pt sleep  . Urticaria     Past obstetric history: OB History  Gravida Para Term Preterm AB Living  4 2 2  1 2   SAB IAB Ectopic Multiple Live Births  1    2    # Outcome Date GA Lbr Len/2nd Weight Sex Type Anes PTL Lv  4 Current           3 Term 2023     Vag-Spont   LIV  2 Term 07/06/20     Vag-Spont  Y LIV  1 SAB             Past Surgical History: Past Surgical History:  Procedure Laterality Date  . ADENOIDECTOMY    . TYMPANOSTOMY     x3  . TYMPANOSTOMY TUBE PLACEMENT      Family History: Family History  Problem Relation Age of Onset  . Allergic rhinitis Mother    . Hypertension Mother   . Allergic rhinitis Father   . Throat cancer Father   . Melanoma Father   . Urticaria Sister   . Allergic rhinitis Sister   . Anxiety disorder Sister   . Memory loss Maternal Grandmother   . Colon cancer Maternal Grandmother   . Heart disease Paternal Grandfather   . Lung cancer Paternal Grandfather   . Obesity Paternal Grandfather   . Diabetes Paternal Grandfather   . Seizures Neg Hx     Social History: Social History   Tobacco Use  . Smoking status: Never    Passive exposure: Never  . Smokeless tobacco: Never  Vaping Use  . Vaping status: Former  . Substances: CBD  Substance Use Topics  . Alcohol use: Not Currently  . Drug use: No    Allergies:  Allergies  Allergen Reactions  . Relpax [Eletriptan] Anaphylaxis    Throat swelling  . Sulfa Antibiotics Hives and Rash  . Gluten Meal   . Latex Hives  . Lamotrigine Rash    Meds:  Medications Prior to Admission  Medication Sig Dispense Refill Last Dose/Taking  . albuterol  (VENTOLIN  HFA) 108 (90 Base) MCG/ACT inhaler Inhale 2 puffs into the lungs every 6 (six) hours as needed for wheezing or shortness of breath. 8 g 2 Past Month  . amphetamine-dextroamphetamine (ADDERALL) 20 MG tablet Take 20 mg by mouth 2 (two) times daily. 20mg  in the morning and 10mg  at night   11/08/2023  . cetirizine (ZYRTEC) 10 MG tablet Take 10 mg by mouth daily.   11/08/2023  . cromolyn  (GASTROCROM ) 100 MG/5ML solution TAKE 5 ML(100 MG) BY MOUTH THREE TIMES DAILY BEFORE MEALS 480 mL 5 11/07/2023  . famotidine (PEPCID) 20 MG tablet Take 20 mg by mouth in the morning, at noon, and at bedtime.   11/08/2023  . fluticasone  (FLONASE ) 50 MCG/ACT nasal spray Place 2 sprays into both nostrils daily. 16 g 0 11/08/2023  . gabapentin  (NEURONTIN ) 400 MG capsule Take 1 capsule (400 mg total) by mouth 3 (three) times daily. 270 capsule 1 11/08/2023  . magnesium oxide (MAG-OX) 400 MG tablet Take 200 mg by mouth daily.   11/07/2023  . montelukast   (SINGULAIR ) 10 MG tablet Take 1 tablet (10 mg total) by mouth at bedtime. 90 tablet 0 11/07/2023  . NONFORMULARY OR COMPOUNDED ITEM Take 1-2 mg by mouth daily. Naltrexone 90 each 1 11/08/2023  . propranolol (INDERAL) 10 MG tablet Take 10 mg by mouth 3 (three) times daily.   11/08/2023  . amoxicillin  (AMOXIL ) 875 MG tablet Take 1 tablet (875 mg total) by mouth 2 (two) times daily. 14 tablet 0   . cyclobenzaprine  (FLEXERIL ) 5 MG tablet TAKE 1 TABLET(5 MG) BY MOUTH AT BEDTIME AS NEEDED FOR MUSCLE SPASMS (Patient taking differently: Take 5 mg by mouth at bedtime.) 30 tablet 3   . EPINEPHrine  0.3 mg/0.3 mL IJ SOAJ injection Inject 0.3 mg into the muscle as needed for anaphylaxis. 1 each 2   . midodrine (PROAMATINE) 2.5 MG tablet Take 2.5 mg by mouth 3 (three) times daily with meals. As needed for hypotension (Patient not taking: Reported on 10/21/2023)   More than a month  . ondansetron  (ZOFRAN -ODT) 4 MG disintegrating tablet Take 1 tablet (4 mg total) by mouth every 8 (eight) hours as needed. 20 tablet 0 More than a month    ROS:  Review of Systems  Constitutional:  Negative for chills, fatigue and fever.  Eyes:  Negative for visual disturbance.  Respiratory:  Negative for shortness of breath.   Cardiovascular:  Negative for chest pain.  Gastrointestinal:  Positive for abdominal pain, anal bleeding, blood in stool, constipation and nausea. Negative for vomiting.  Genitourinary:  Negative for difficulty urinating, dysuria, flank pain, pelvic pain, vaginal bleeding, vaginal discharge and vaginal pain.  Neurological:  Negative for dizziness and headaches.  Psychiatric/Behavioral: Negative.       I have reviewed patient's Past Medical Hx, Surgical Hx, Family Hx, Social Hx, medications and allergies.   Physical Exam  Patient Vitals for the past 24 hrs:  BP Temp Temp src Pulse Resp SpO2 Height Weight  11/08/23 1817 109/68 -- -- 78 -- -- -- --  11/08/23 1800 (!) 117/57 -- -- 84 -- -- -- --  11/08/23 1745  110/62 -- -- 84 -- -- -- --  11/08/23 1730 114/74 -- -- 84 -- -- -- --  11/08/23 1714 110/68 98.2 F (36.8 C) Oral 77 18 99 % -- --  11/08/23 1703 -- -- -- -- -- -- 5\' 4"  (1.626 m) 78.8 kg   Constitutional: Well-developed, well-nourished  female in no acute distress.  Cardiovascular: normal rate Respiratory: normal effort GI: Abd soft, non-tender, gravid appropriate for gestational age.  MS: Extremities nontender, no edema, normal ROM Neurologic: Alert and oriented x 4.  GU: Neg CVAT.  PELVIC EXAM:  Dilation: Closed Effacement (%): Thick Station: Ballotable Exam by:: Lizbeth Right, CNM  FHT:  Baseline 135 , moderate variability, accelerations present, no decelerations Contractions: none on toco or to palpation   Labs: Results for orders placed or performed during the hospital encounter of 11/08/23 (from the past 24 hours)  Urinalysis, Routine w reflex microscopic -Urine, Clean Catch     Status: Abnormal   Collection Time: 11/08/23  5:04 PM  Result Value Ref Range   Color, Urine COLORLESS (A) YELLOW   APPearance CLEAR CLEAR   Specific Gravity, Urine 1.002 (L) 1.005 - 1.030   pH 7.0 5.0 - 8.0   Glucose, UA NEGATIVE NEGATIVE mg/dL   Hgb urine dipstick SMALL (A) NEGATIVE   Bilirubin Urine NEGATIVE NEGATIVE   Ketones, ur NEGATIVE NEGATIVE mg/dL   Protein, ur NEGATIVE NEGATIVE mg/dL   Nitrite NEGATIVE NEGATIVE   Leukocytes,Ua NEGATIVE NEGATIVE   RBC / HPF 0-5 0 - 5 RBC/hpf   WBC, UA 0-5 0 - 5 WBC/hpf   Bacteria, UA NONE SEEN NONE SEEN   Squamous Epithelial / HPF 0-5 0 - 5 /HPF      Imaging:  No results found.  MAU Course/MDM: Orders Placed This Encounter  Procedures  . Gastrointestinal Panel by PCR , Stool  . Calprotectin, Fecal  . MR ABDOMEN WO CONTRAST  . MR PELVIS WO CONTRAST  . Urinalysis, Routine w reflex microscopic -Urine, Clean Catch  . CBC  . Comprehensive metabolic panel  . Enteric precautions (UV disinfection) C difficile, Norovirus    No orders  of the defined types were placed in this encounter.   Reviewed pt previous imaging, including abdominal MRI in 2024 and mesenteric complete 06/2023.    NST reviewed and appropriate for gestational age Cervix closed, no evidence of PTL Consult GI, spoke with Dr General Kenner who recommended CBC to evaluate WBCs, GI pathogen panel, fecal calprotectin, and consider imaging if pain severe, or WBCs elevated. Discussed MRI vs CT vs KUB and recommendation to order MRI if imaging warranted.  If no acute findings, pt would prefer referral to [facility] for close follow up  Report to Dr Daisey Dryer, with labs and imaging pending  Arlester Bence Certified Nurse-Midwife 11/08/2023 8:19 PM   10:51 PM Spoke with Dr Violeta Grey no acute appendicitis or other concerning features. He will preliminary tonight and push to daytime radiologist..   Assessment and Plan:

## 2023-11-08 NOTE — MAU Note (Signed)
 Angela Woodard is a 33 y.o. at [redacted]w[redacted]d here in MAU reporting: pt awakened at 0300 with horrible abd pain, felt like she was going to have diarrhea.  Went to the bathroom, eventually did have BM and some diarrhea. Broke out into a sweat.  has had 8 or 9 more episodes of the pain, has been passing bloody mucous.  Prior to the preg, was having a GI workup, planned to do CT, but became preg.  Denies LOF or vag bleeding. Onset of complaint: 0300 Pain score: 7 There were no vitals filed for this visit.   Bypassed triage, reports +FM Lab orders placed from triage:  urine

## 2023-11-09 DIAGNOSIS — K921 Melena: Principal | ICD-10-CM

## 2023-11-09 DIAGNOSIS — G8929 Other chronic pain: Secondary | ICD-10-CM | POA: Diagnosis present

## 2023-11-09 DIAGNOSIS — R109 Unspecified abdominal pain: Secondary | ICD-10-CM

## 2023-11-09 DIAGNOSIS — R195 Other fecal abnormalities: Secondary | ICD-10-CM | POA: Diagnosis not present

## 2023-11-09 DIAGNOSIS — R1030 Lower abdominal pain, unspecified: Secondary | ICD-10-CM | POA: Diagnosis not present

## 2023-11-09 DIAGNOSIS — K559 Vascular disorder of intestine, unspecified: Secondary | ICD-10-CM | POA: Diagnosis present

## 2023-11-09 DIAGNOSIS — Z3A27 27 weeks gestation of pregnancy: Secondary | ICD-10-CM

## 2023-11-09 DIAGNOSIS — Z8249 Family history of ischemic heart disease and other diseases of the circulatory system: Secondary | ICD-10-CM | POA: Diagnosis not present

## 2023-11-09 DIAGNOSIS — Z833 Family history of diabetes mellitus: Secondary | ICD-10-CM | POA: Diagnosis not present

## 2023-11-09 DIAGNOSIS — O99352 Diseases of the nervous system complicating pregnancy, second trimester: Secondary | ICD-10-CM | POA: Diagnosis present

## 2023-11-09 DIAGNOSIS — G90A Postural orthostatic tachycardia syndrome (POTS): Secondary | ICD-10-CM | POA: Diagnosis present

## 2023-11-09 DIAGNOSIS — M797 Fibromyalgia: Secondary | ICD-10-CM | POA: Diagnosis present

## 2023-11-09 DIAGNOSIS — Z79899 Other long term (current) drug therapy: Secondary | ICD-10-CM | POA: Diagnosis not present

## 2023-11-09 LAB — GASTROINTESTINAL PANEL BY PCR, STOOL (REPLACES STOOL CULTURE)

## 2023-11-09 LAB — TYPE AND SCREEN
ABO/RH(D): O POS
Antibody Screen: NEGATIVE

## 2023-11-09 LAB — CBC
HCT: 29.8 % — ABNORMAL LOW (ref 36.0–46.0)
Hemoglobin: 10.1 g/dL — ABNORMAL LOW (ref 12.0–15.0)
MCH: 33.1 pg (ref 26.0–34.0)
MCHC: 33.9 g/dL (ref 30.0–36.0)
MCV: 97.7 fL (ref 80.0–100.0)
Platelets: 167 10*3/uL (ref 150–400)
RBC: 3.05 MIL/uL — ABNORMAL LOW (ref 3.87–5.11)
RDW: 12.8 % (ref 11.5–15.5)
WBC: 10.8 10*3/uL — ABNORMAL HIGH (ref 4.0–10.5)
nRBC: 0 % (ref 0.0–0.2)

## 2023-11-09 LAB — PROTIME-INR
INR: 1 (ref 0.8–1.2)
Prothrombin Time: 13.6 s (ref 11.4–15.2)

## 2023-11-09 LAB — APTT: aPTT: 29 s (ref 24–36)

## 2023-11-09 MED ORDER — LORATADINE 10 MG PO TABS
10.0000 mg | ORAL_TABLET | Freq: Every day | ORAL | Status: DC
  Administered 2023-11-09 – 2023-11-10 (×2): 10 mg via ORAL
  Filled 2023-11-09 (×2): qty 1

## 2023-11-09 MED ORDER — DOCUSATE SODIUM 100 MG PO CAPS: 100.0000 mg | ORAL_CAPSULE | Freq: Every day | ORAL | Status: DC

## 2023-11-09 MED ORDER — NALTREXONE POWD
2.0000 mg | Freq: Every day | Status: DC
  Administered 2023-11-09: 2 mg via ORAL

## 2023-11-09 MED ORDER — MAGNESIUM OXIDE -MG SUPPLEMENT 400 (240 MG) MG PO TABS
200.0000 mg | ORAL_TABLET | Freq: Every day | ORAL | Status: DC
  Administered 2023-11-09 – 2023-11-10 (×2): 200 mg via ORAL
  Filled 2023-11-09: qty 1
  Filled 2023-11-09: qty 0.5
  Filled 2023-11-09: qty 1

## 2023-11-09 MED ORDER — CROMOLYN SODIUM 100 MG/5ML PO CONC: 100.0000 mg | Freq: Three times a day (TID) | ORAL | Status: DC

## 2023-11-09 MED ORDER — GABAPENTIN 400 MG PO CAPS: 400.0000 mg | ORAL_CAPSULE | Freq: Three times a day (TID) | ORAL | Status: DC

## 2023-11-09 MED ORDER — ACETAMINOPHEN 325 MG PO TABS
650.0000 mg | ORAL_TABLET | ORAL | Status: DC | PRN
  Administered 2023-11-09 – 2023-11-10 (×4): 650 mg via ORAL
  Filled 2023-11-09 (×4): qty 2

## 2023-11-09 MED ORDER — PRENATAL MULTIVITAMIN CH
1.0000 | ORAL_TABLET | Freq: Every day | ORAL | Status: DC
Start: 2023-11-09 — End: 2023-11-10
  Administered 2023-11-09 – 2023-11-10 (×2): 1 via ORAL
  Filled 2023-11-09 (×2): qty 1

## 2023-11-09 MED ORDER — PROPRANOLOL HCL 10 MG PO TABS
10.0000 mg | ORAL_TABLET | Freq: Three times a day (TID) | ORAL | Status: DC
  Administered 2023-11-09 (×4): 10 mg via ORAL
  Filled 2023-11-09 (×5): qty 1

## 2023-11-09 MED ORDER — MIDODRINE HCL 2.5 MG PO TABS
2.5000 mg | ORAL_TABLET | Freq: Three times a day (TID) | ORAL | Status: DC | PRN
  Administered 2023-11-09 (×2): 2.5 mg via ORAL
  Filled 2023-11-09 (×3): qty 1

## 2023-11-09 MED ORDER — CALCIUM CARBONATE ANTACID 500 MG PO CHEW: 2.0000 | CHEWABLE_TABLET | ORAL | Status: DC | PRN

## 2023-11-09 MED ORDER — LACTATED RINGERS IV SOLN: 125.0000 mL/h | INTRAVENOUS | Status: AC

## 2023-11-09 MED ORDER — OXYCODONE-ACETAMINOPHEN 5-325 MG PO TABS
1.0000 | ORAL_TABLET | Freq: Once | ORAL | Status: AC
  Administered 2023-11-09: 1 via ORAL
  Filled 2023-11-09: qty 1

## 2023-11-09 MED ORDER — AMPHETAMINE-DEXTROAMPHETAMINE 10 MG PO TABS
20.0000 mg | ORAL_TABLET | Freq: Every day | ORAL | Status: DC
  Administered 2023-11-09 – 2023-11-10 (×2): 20 mg via ORAL
  Filled 2023-11-09 (×3): qty 2

## 2023-11-09 MED ORDER — ONDANSETRON 4 MG PO TBDP: 4.0000 mg | ORAL_TABLET | Freq: Three times a day (TID) | ORAL | Status: DC | PRN

## 2023-11-09 MED ORDER — PROPRANOLOL HCL 10 MG PO TABS: 10.0000 mg | ORAL_TABLET | Freq: Three times a day (TID) | ORAL | Status: DC

## 2023-11-09 MED ORDER — AMPHETAMINE-DEXTROAMPHETAMINE 10 MG PO TABS: 10.0000 mg | ORAL_TABLET | Freq: Every day | ORAL | Status: DC

## 2023-11-09 MED ORDER — CYCLOBENZAPRINE HCL 10 MG PO TABS
5.0000 mg | ORAL_TABLET | Freq: Every day | ORAL | Status: DC
  Administered 2023-11-09 (×2): 5 mg via ORAL
  Filled 2023-11-09 (×2): qty 1

## 2023-11-09 MED ORDER — GABAPENTIN 400 MG PO CAPS
400.0000 mg | ORAL_CAPSULE | Freq: Three times a day (TID) | ORAL | Status: DC
  Administered 2023-11-09 – 2023-11-10 (×5): 400 mg via ORAL
  Filled 2023-11-09: qty 4
  Filled 2023-11-09 (×3): qty 1
  Filled 2023-11-09: qty 4
  Filled 2023-11-09: qty 1
  Filled 2023-11-09: qty 4
  Filled 2023-11-09 (×2): qty 1
  Filled 2023-11-09: qty 4
  Filled 2023-11-09: qty 1

## 2023-11-09 MED ORDER — FAMOTIDINE 20 MG PO TABS
20.0000 mg | ORAL_TABLET | Freq: Three times a day (TID) | ORAL | Status: DC
  Administered 2023-11-09 – 2023-11-10 (×5): 20 mg via ORAL
  Filled 2023-11-09 (×5): qty 1

## 2023-11-09 MED ORDER — MONTELUKAST SODIUM 10 MG PO TABS
10.0000 mg | ORAL_TABLET | Freq: Every day | ORAL | Status: DC
  Administered 2023-11-09: 10 mg via ORAL
  Filled 2023-11-09 (×2): qty 1

## 2023-11-09 NOTE — MAU Note (Signed)
 Stool sample sent per order-fibrous light red mucous in apperance

## 2023-11-09 NOTE — MAU Note (Signed)
 Pt reports more severe mid abdominal cramping has returned in addition to the constant dull ache/pain that remains. Requesting pain medication

## 2023-11-09 NOTE — Consult Note (Addendum)
 Attending physician's note   I have taken a history, reviewed the chart, and examined the patient. I performed a substantive portion of this encounter, including complete performance of at least one of the key components, in conjunction with the APP. I agree with the APP's note, impression, and recommendations with my edits.  33 year old female with medical history as outlined below, to include history of EDS, POTS, celiac disease, currently [redacted] weeks gestation, presenting with new onset hematochezia and mucus-like stool.  Initially had severe lower abdominal cramping with associated nausea/vomiting and diaphoresis/chills, then mucus-like, bloody stool.  No prior similar symptoms.  Admission evaluation notable for WBC 13.2 (now 10.8), H/H 11.6/34.7, GI PCR panel negative/normal, and MRI showing possible mild thickening in the descending colon.  Fecal calprotectin pending.  History of POTS, but has not been taking midodrine during her pregnancy.  She is otherwise very good about maintaining adequate hydration and tries to maintain increased sodium.  Based on clinical presentation, seems most consistent with hypoperfusion induced ischemic colitis.  This is typically self-limiting and does not require endoscopic evaluation.  - Advance diet as tolerated - Continue aggressive hydration - Serial CBC checks - Will defer decision to restart midodrine to OB service - To follow-up with her Cardiologist - Can follow-up with her primary GI as outpatient - Inpatient GI service will remain available as needed  [facility] [phone] office         Consultation  Referring Provider: Bobette [facility]ard Primary Care Physician:  Angela Gaucher, DO Primary Gastroenterologist:  [facility]  Reason for Consultation:   acute severe abdominal pain, bloody mucoid stools  HPI: [facility] is a 33 y.o. 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 She is currently [redacted] weeks pregnant, and has been doing well. She has been maintained on a combination of midodrine and propranolol for management of POTS.  Apparently she has had significant issues with tachycardia requiring the [facility].  Since she has been pregnant she has been off of midodrine and using [facility] 10 mg 3 times daily.  Patient says she was in her usual state of health this weekend, and early in the morning hours of Sunday, 11/08/2023 she was awakened with severe abdominal pain cramping, followed by diffuse diaphoresis, nausea without vomiting.  She initially had a normal-appearing bowel movement and then after that started passing bloody mucoid material which she says was occurring very frequently but fairly small-volume. She presented to the emergency room last evening and was admitted.  Initial labs show WBC of 13.2/hemoglobin 11.6 Today WBC 10.8/hemoglobin 10.1/MCV 97/platelets 167 INR 1.0 Potassium 3.7/BUN 8/creatinine 0.58. GI path panel is pending  She had MRI of the abdomen pelvis earlier this morning that shows equivocal mild wall thickening in the descending colon no surrounding fluid or stranding, otherwise unremarkable, noting 27-week pregnancy.  Patient has not had any previous similar episodes.  [facility] and was actually scheduling a colonoscopy and endoscopy in November 2024 when she learned that she was pregnant. She has had some ongoing issues with postprandial abdominal pain, alterations in bowel habits and it had some scant hematochezia.  She has been pushing fluids since admission, keeping them down without difficulty and is hungry.  Still complaining of abdominal pain though certainly not as severe as that onset and the episodes of passage of small volume bloody mucoid  material and what appears to be "tissue" are becoming  less frequent today.  Reports that generally with her POTS she is good about pushing fluids, and has been told that she needs 10 g of sodium per day which she says is very difficult.   Past Medical History:  Diagnosis Date   Arthritis    Asthma    Celiac disease    Depression    Ehlers-Danlos, hypermobile type    Normal echo 2021   Fibromyalgia    History of kidney stones    Mast cell activation syndrome (HCC)    Migraine    Nephropathy    OCD (obsessive compulsive disorder)    POTS (postural orthostatic tachycardia syndrome)    Pruritus    Normal bile acids 06/06/2021   Seizure disorder (HCC)    Last reported event 05/15/2021- during pt sleep   Urticaria     Past Surgical History:  Procedure Laterality Date   ADENOIDECTOMY     TYMPANOSTOMY     x3   TYMPANOSTOMY TUBE PLACEMENT      Prior to Admission medications   Medication Sig Start Date End Date Taking? Authorizing Provider  albuterol  (VENTOLIN  HFA) 108 (90 Base) MCG/ACT inhaler Inhale 2 puffs into the lungs every 6 (six) hours as needed for wheezing or shortness of breath. 01/15/23  Yes [facility], Angela Ceo, MD  amphetamine-dextroamphetamine (ADDERALL) 20 MG tablet Take 20 mg by mouth 2 (two) times daily. 20mg  in the morning and 10mg  at night 12/03/22 12/03/23 Yes [provider]  cetirizine (ZYRTEC) 10 MG tablet Take 10 mg by mouth daily.   Yes [provider]  cromolyn  (GASTROCROM ) 100 MG/5ML solution TAKE 5 ML(100 MG) BY MOUTH THREE TIMES DAILY BEFORE MEALS 10/19/23  Yes Angela [facility], Angela Ceo, MD  cyclobenzaprine  (FLEXERIL ) 5 MG tablet TAKE 1 TABLET(5 MG) BY MOUTH AT BEDTIME AS NEEDED FOR MUSCLE SPASMS Patient taking differently: Take 5 mg by mouth at bedtime. 05/14/23  Yes Angela Sand, MD  famotidine (PEPCID) 20 MG tablet Take 20 mg by mouth in the morning, at noon, and at bedtime.   Yes [provider]  fluticasone  (FLONASE ) 50 MCG/ACT nasal spray Place 2 sprays into both  nostrils daily. 08/17/22  Yes Angela [facility]ard, Angela W, NP  gabapentin  (NEURONTIN ) 400 MG capsule Take 1 capsule (400 mg total) by mouth 3 (three) times daily. 06/29/23  Yes Angela Moss, MD  magnesium oxide (MAG-OX) 400 MG tablet Take 200 mg by mouth daily.   Yes [provider]  montelukast  (SINGULAIR ) 10 MG tablet Take 1 tablet (10 mg total) by mouth at bedtime. 06/01/23  Yes Smith, Zachary M, DO  naltrexone (DEPADE) 50 MG tablet Take 2 mg by mouth at bedtime.   Yes [provider]  NONFORMULARY OR COMPOUNDED ITEM Take 1-2 mg by mouth daily. Naltrexone 06/03/23  Yes Angela Sand, MD  ondansetron  (ZOFRAN -ODT) 4 MG disintegrating tablet Take 1 tablet (4 mg total) by mouth every 8 (eight) hours as needed. 06/26/23  Yes Henderly, Britni A, PA-C  propranolol ([facility]) 10 MG tablet Take 10 mg by mouth 3 (three) times daily.   Yes [provider]  amoxicillin  (AMOXIL ) 875 MG tablet Take 1 tablet (875 mg total) by mouth 2 (two) times daily. 10/27/23   Farris Hong, PA-C  EPINEPHrine  0.3 mg/0.3 mL IJ SOAJ injection Inject 0.3 mg into the muscle as needed for anaphylaxis. 01/15/23   Brian Campanile, MD  midodrine (PROAMATINE) 2.5 MG tablet Take 2.5 mg by  mouth 3 (three) times daily with meals. As needed for hypotension Patient not taking: Reported on 10/21/2023    [provider]    Current Facility-Administered Medications  Medication Dose Route Frequency Provider Last Rate Last Admin   acetaminophen (TYLENOL) tablet 650 mg  650 mg Oral Q4H PRN Abner Ables, MD   650 mg at 11/09/23 0859   amphetamine-dextroamphetamine (ADDERALL) tablet 20 mg  20 mg Oral Daily Abner Ables, MD   20 mg at 11/09/23 0900   calcium carbonate (TUMS - dosed in mg elemental calcium) chewable tablet 400 mg of elemental calcium  2 tablet Oral Q4H PRN Abner Ables, MD       cyclobenzaprine  (FLEXERIL ) tablet 5 mg  5 mg Oral QHS Abner Ables, MD    5 mg at 11/09/23 0130   docusate sodium (COLACE) capsule 100 mg  100 mg Oral Daily Abner Ables, MD       famotidine (PEPCID) tablet 20 mg  20 mg Oral TID Abner Ables, MD   20 mg at 11/09/23 8295   gabapentin  (NEURONTIN ) capsule 400 mg  400 mg Oral TID Abner Ables, MD   400 mg at 11/09/23 0901   lactated ringers infusion  125 mL/hr Intravenous On Call to OR Abner Ables, MD   Held at 11/09/23 0505   loratadine (CLARITIN) tablet 10 mg  10 mg Oral Daily Abner Ables, MD   10 mg at 11/09/23 0859   magnesium oxide (MAG-OX) tablet 200 mg  200 mg Oral Daily Abner Ables, MD   200 mg at 11/09/23 0857   midodrine (PROAMATINE) tablet 2.5 mg  2.5 mg Oral TID WC PRN Abner Ables, MD       [START ON 11/10/2023] montelukast  (SINGULAIR ) tablet 10 mg  10 mg Oral QHS Abner Ables, MD       ondansetron  (ZOFRAN -ODT) disintegrating tablet 4 mg  4 mg Oral Q8H PRN Abner Ables, MD       prenatal multivitamin tablet 1 tablet  1 tablet Oral Q1200 Abner Ables, MD       propranolol (Angela [facility]ard) tablet 10 mg  10 mg Oral TID Abner Ables, MD   10 mg at 11/09/23 0901    Allergies as of 11/08/2023 - Review Complete 11/08/2023  Allergen Reaction Noted   Relpax [eletriptan] Anaphylaxis 12/04/2015   Sulfa antibiotics Hives and Rash 10/26/2015   Gluten meal  12/04/2015   Latex Hives 06/18/2021   Lamotrigine Rash 12/04/2015    Family History  Problem Relation Age of Onset   Allergic rhinitis Mother    Hypertension Mother    Allergic rhinitis Father    Throat cancer Father    Melanoma Father    Urticaria Sister    Allergic rhinitis Sister    Anxiety disorder Sister    Memory loss Maternal Grandmother    Colon cancer Maternal Grandmother    Heart disease Paternal Grandfather    Lung cancer Paternal Grandfather    Obesity Paternal Grandfather    Diabetes Paternal Grandfather    Seizures Neg Hx     Social  History   Socioeconomic History   Marital status: Married    Spouse name: Psychologist, clinical   Number of children: 1   Years of education: some colle   Highest education level: Not on file  Occupational History   Occupation: Engineer, water at [employer]  Tobacco Use   Smoking status: Never  Passive exposure: Never   Smokeless tobacco: Never  Vaping Use   Vaping status: Former   Substances: CBD  Substance and Sexual Activity   Alcohol use: Not Currently   Drug use: No   Sexual activity: Not Currently    Birth control/protection: Condom  Other Topics Concern   Not on file  Social History Narrative   Lives with husband and 2 adopted special needs children and 60 month old   Caffeine use: 1 serving daily   Right handed    Social Drivers of Health   Financial Resource Strain: Low Risk  (07/22/2023)   Received from [facility]   Overall Financial Resource Strain (CARDIA)    Difficulty of Paying Living Expenses: Not hard at all  Food Insecurity: No Food Insecurity (11/09/2023)   Hunger Vital Sign    Worried About Running Out of Food in the Last Year: Never true    Ran Out of Food in the Last Year: Never true  Transportation Needs: No Transportation Needs (11/09/2023)   PRAPARE - Administrator, Civil Service (Medical): No    Lack of Transportation (Non-Medical): No  Physical Activity: Insufficiently Active (07/22/2023)   Received from [facility]   Exercise Vital Sign    Days of Exercise per Week: 3 days    Minutes of Exercise per Session: 30 min  Stress: Stress Concern Present (07/22/2023)   Received from [facility] - Occupational Stress Questionnaire    Feeling of Stress : Rather much  Social Connections: Socially Integrated (07/22/2023)   Received from [facility]   Social Connection and Isolation Panel [NHANES]    Frequency of Communication  with Friends and Family: More than three times a week    Frequency of Social Gatherings with Friends and Family: More than three times a week    Attends Religious Services: 1 to 4 times per year    Active Member of [employer] or Organizations: Yes    Attends Engineer, structural: More than 4 times per year    Marital Status: Married  Catering manager Violence: Not At Risk (11/09/2023)   Humiliation, Afraid, Rape, and Kick questionnaire    Fear of Current or Ex-Partner: No    Emotionally Abused: No    Physically Abused: No    Sexually Abused: No    Review of Systems: Pertinent positive and negative review of systems were noted in the above HPI section.  All other review of systems was otherwise negative.   Physical Exam: Vital signs in last 24 hours: Temp:  [97.4 F (36.3 C)-98.2 F (36.8 C)] 98 F (36.7 C) (05/05 0148) Pulse Rate:  [60-84] 83 (05/05 0148) Resp:  [16-18] 17 (05/05 0148) BP: (104-119)/(56-74) 109/56 (05/05 0148) SpO2:  [98 %-99 %] 98 % (05/05 0148) Weight:  [78.8 kg] 78.8 kg (05/04 1703)   General:   Alert,  Well-developed, well-nourished, WF pleasant and cooperative in NAD, family at bedside Head:  Normocephalic and atraumatic. Eyes:  Sclera clear, no icterus.   Conjunctiva pink. Ears:  Normal auditory acuity. Nose:  No deformity, discharge,  or lesions. Mouth:  No deformity or lesions.   Neck:  Supple; no masses or thyromegaly. Lungs:  Clear throughout to auscultation.   No wheezes, crackles, or rhonchi.  Heart:  Regular rate and rhythm; no murmurs, clicks, rubs,  or gallops. Abdomen:  fundus about 4 FB above umbilicus, she is tender  in the LLQ , no rebound , BS +  Rectal:  Deferred  Msk:  Symmetrical without gross deformities. . Pulses:  Normal pulses noted. Extremities:  Without clubbing or edema. Neurologic:  Alert and  oriented x4;  grossly normal neurologically. Skin:  Intact without significant lesions or rashes.. Psych:  Alert and cooperative.  Normal mood and affect.  Intake/Output from previous day: No intake/output data recorded. Intake/Output this shift: No intake/output data recorded.  Lab Results: Recent Labs    11/08/23 2016 11/09/23 0502  WBC 13.2* 10.8*  HGB 11.6* 10.1*  HCT 34.7* 29.8*  PLT 216 167   BMET Recent Labs    11/08/23 2016  NA 135  K 3.7  CL 106  CO2 21*  GLUCOSE 92  BUN 8  CREATININE 0.58  CALCIUM 8.8*   LFT Recent Labs    11/08/23 2016  PROT 6.0*  ALBUMIN 2.9*  AST 15  ALT 10  ALKPHOS 46  BILITOT 0.4   PT/INR Recent Labs    11/09/23 0502  LABPROT 13.6  INR 1.0     IMPRESSION:  #79 33 year old white female [redacted] weeks pregnant presents with acute onset of severe lower abdominal pain/cramping followed by diaphoresis, nausea, normal bowel movement then followed by multiple episodes of smaller volume bloody mucoid stool.  She was not hypotensive on admission but expect with her history of Ehlers-Danlos and POTS requiring midodrine and [facility] for maintenance that she became hypotensive during the night precipitating an episode of left-sided segmental ischemic colitis.  This is a small vessel phenomenon often precipitated by hypotension/dehydration etc, and more common in women  MRI shows a very mild inflammatory process in the descending colon which is also consistent.  #2 Ehlers-Danlos #3.  POTS-with chronic tachycardia #4 fibromyalgia #5.  Celiac disease #6 mast cell activation syndrome #7 long COVID  Plan; okay to advance to gluten-free soft diet Continue to push oral fluids, including electrolyte supplementation i.e. Pedialyte/Gatorade/Powerade  She does not need to have colonoscopy at present, generally this process improved significantly within 4 to 5 days and resolves without any specific treatment.  I think she should stay in the hospital today, repeat labs in a.m. and if symptomatically improved tomorrow she can be discharged She can arrange for EGD and  colonoscopy with her primary GI group postpartum.  Do think it would be helpful if she could go back on midodrine maintenance which she has been off of since she learned she was pregnant, this needs to be discussed with OB to be sure this is appropriate/safe at this stage of her pregnancy-this will counteract any potential hypotension contribution from Angela Woodard.    Amy Esterwood PA-C 11/09/2023, 12:35 PM

## 2023-11-09 NOTE — H&P (Signed)
 Chief Complaint:  Nausea and Abdominal Pain   Event Date/Time   First Provider Initiated Contact with Patient 11/08/23 1804      HPI: Angela Woodard is a 33 y.o. [id number] at [redacted]w[redacted]d who presents to maternity admissions reporting onset of severe lower abdominal pain at 0300 this morning, followed by a normal bowel movement, then rectal bleeding/bloody mucus.  There is only bloody mucus now, and the pain has improved but not resolved. She describes the pain as "twisting" pain and still feels like something is stuck and won't come out.  Prior to pregnancy, she was being evaluated by [facility] for bowel issues including bloody stools, but never as much blood as today.  She plans to f/u with [facility] but does not yet have an appointment.   She reports good fetal movement, denies contractions, or other OB concerns.       Past Medical History: Past Medical History:  Diagnosis Date   Arthritis    Asthma    Celiac disease    Depression    Ehlers-Danlos, hypermobile type    Normal echo 2021   Fibromyalgia    History of kidney stones    Mast cell activation syndrome (HCC)    Migraine    Nephropathy    OCD (obsessive compulsive disorder)    POTS (postural orthostatic tachycardia syndrome)    Pruritus    Normal bile acids 06/06/2021   Seizure disorder (HCC)    Last reported event 05/15/2021- during pt sleep   Urticaria     Past obstetric history: OB History  Gravida Para Term Preterm AB Living  4 2 2  1 2   SAB IAB Ectopic Multiple Live Births  1    2    # Outcome Date GA Lbr Len/2nd Weight Sex Type Anes PTL Lv  4 Current           3 Term 2023     Vag-Spont   LIV  2 Term 07/06/20     Vag-Spont  Y LIV  1 SAB             Past Surgical History: Past Surgical History:  Procedure Laterality Date   ADENOIDECTOMY     TYMPANOSTOMY     x3   TYMPANOSTOMY TUBE PLACEMENT      Family History: Family History  Problem Relation Age of Onset   Allergic rhinitis Mother    Hypertension  Mother    Allergic rhinitis Father    Throat cancer Father    Melanoma Father    Urticaria Sister    Allergic rhinitis Sister    Anxiety disorder Sister    Memory loss Maternal Grandmother    Colon cancer Maternal Grandmother    Heart disease Paternal Grandfather    Lung cancer Paternal Grandfather    Obesity Paternal Grandfather    Diabetes Paternal Grandfather    Seizures Neg Hx     Social History: Social History   Tobacco Use   Smoking status: Never    Passive exposure: Never   Smokeless tobacco: Never  Vaping Use   Vaping status: Former   Substances: CBD  Substance Use Topics   Alcohol use: Not Currently   Drug use: No    Allergies:  Allergies  Allergen Reactions   Relpax [Eletriptan] Anaphylaxis    Throat swelling   Sulfa Antibiotics Hives and Rash   Gluten Meal    Latex Hives   Lamotrigine Rash    Meds:  Medications Prior to Admission  Medication Sig Dispense Refill Last Dose/Taking   albuterol  (VENTOLIN  HFA) 108 (90 Base) MCG/ACT inhaler Inhale 2 puffs into the lungs every 6 (six) hours as needed for wheezing or shortness of breath. 8 g 2 Past Month   amphetamine-dextroamphetamine (ADDERALL) 20 MG tablet Take 20 mg by mouth 2 (two) times daily. 20mg  in the morning and 10mg  at night   11/08/2023   cetirizine (ZYRTEC) 10 MG tablet Take 10 mg by mouth daily.   11/08/2023   cromolyn  (GASTROCROM ) 100 MG/5ML solution TAKE 5 ML(100 MG) BY MOUTH THREE TIMES DAILY BEFORE MEALS 480 mL 5 11/07/2023   famotidine (PEPCID) 20 MG tablet Take 20 mg by mouth in the morning, at noon, and at bedtime.   11/08/2023   fluticasone  (FLONASE ) 50 MCG/ACT nasal spray Place 2 sprays into both nostrils daily. 16 g 0 11/08/2023   gabapentin  (NEURONTIN ) 400 MG capsule Take 1 capsule (400 mg total) by mouth 3 (three) times daily. 270 capsule 1 11/08/2023   magnesium oxide (MAG-OX) 400 MG tablet Take 200 mg by mouth daily.   11/07/2023   montelukast  (SINGULAIR ) 10 MG tablet Take 1 tablet (10 mg total)  by mouth at bedtime. 90 tablet 0 11/07/2023   NONFORMULARY OR COMPOUNDED ITEM Take 1-2 mg by mouth daily. Naltrexone 90 each 1 11/08/2023   propranolol (INDERAL) 10 MG tablet Take 10 mg by mouth 3 (three) times daily.   11/08/2023   amoxicillin  (AMOXIL ) 875 MG tablet Take 1 tablet (875 mg total) by mouth 2 (two) times daily. 14 tablet 0    cyclobenzaprine  (FLEXERIL ) 5 MG tablet TAKE 1 TABLET(5 MG) BY MOUTH AT BEDTIME AS NEEDED FOR MUSCLE SPASMS (Patient taking differently: Take 5 mg by mouth at bedtime.) 30 tablet 3    EPINEPHrine  0.3 mg/0.3 mL IJ SOAJ injection Inject 0.3 mg into the muscle as needed for anaphylaxis. 1 each 2    midodrine (PROAMATINE) 2.5 MG tablet Take 2.5 mg by mouth 3 (three) times daily with meals. As needed for hypotension (Patient not taking: Reported on 10/21/2023)   More than a month   ondansetron  (ZOFRAN -ODT) 4 MG disintegrating tablet Take 1 tablet (4 mg total) by mouth every 8 (eight) hours as needed. 20 tablet 0 More than a month    ROS:  Review of Systems  Constitutional:  Negative for chills, fatigue and fever.  Eyes:  Negative for visual disturbance.  Respiratory:  Negative for shortness of breath.   Cardiovascular:  Negative for chest pain.  Gastrointestinal:  Positive for abdominal pain, anal bleeding, blood in stool, constipation and nausea. Negative for vomiting.  Genitourinary:  Negative for difficulty urinating, dysuria, flank pain, pelvic pain, vaginal bleeding, vaginal discharge and vaginal pain.  Neurological:  Negative for dizziness and headaches.  Psychiatric/Behavioral: Negative.       I have reviewed patient's Past Medical Hx, Surgical Hx, Family Hx, Social Hx, medications and allergies.   Physical Exam  Patient Vitals for the past 24 hrs:  BP Temp Temp src Pulse Resp SpO2 Height Weight  11/08/23 1817 109/68 -- -- 78 -- -- -- --  11/08/23 1800 (!) 117/57 -- -- 84 -- -- -- --  11/08/23 1745 110/62 -- -- 84 -- -- -- --  11/08/23 1730 114/74 -- -- 84  -- -- -- --  11/08/23 1714 110/68 98.2 F (36.8 C) Oral 77 18 99 % -- --  11/08/23 1703 -- -- -- -- -- -- 5\' 4"  (1.626 m) 78.8 kg   Constitutional: Well-developed, well-nourished  female in no acute distress.  Cardiovascular: normal rate Respiratory: normal effort GI: Abd soft, non-tender, gravid appropriate for gestational age.  MS: Extremities nontender, no edema, normal ROM Neurologic: Alert and oriented x 4.  GU: Neg CVAT.  PELVIC EXAM:  Dilation: Closed Effacement (%): Thick Station: Ballotable Exam by:: Lizbeth Right, CNM  FHT:  Baseline 135 , moderate variability, accelerations present, no decelerations Contractions: none on toco or to palpation   Labs: Results for orders placed or performed during the hospital encounter of 11/08/23 (from the past 24 hours)  Urinalysis, Routine w reflex microscopic -Urine, Clean Catch     Status: Abnormal   Collection Time: 11/08/23  5:04 PM  Result Value Ref Range   Color, Urine COLORLESS (A) YELLOW   APPearance CLEAR CLEAR   Specific Gravity, Urine 1.002 (L) 1.005 - 1.030   pH 7.0 5.0 - 8.0   Glucose, UA NEGATIVE NEGATIVE mg/dL   Hgb urine dipstick SMALL (A) NEGATIVE   Bilirubin Urine NEGATIVE NEGATIVE   Ketones, ur NEGATIVE NEGATIVE mg/dL   Protein, ur NEGATIVE NEGATIVE mg/dL   Nitrite NEGATIVE NEGATIVE   Leukocytes,Ua NEGATIVE NEGATIVE   RBC / HPF 0-5 0 - 5 RBC/hpf   WBC, UA 0-5 0 - 5 WBC/hpf   Bacteria, UA NONE SEEN NONE SEEN   Squamous Epithelial / HPF 0-5 0 - 5 /HPF      Imaging:  No results found.  MAU Course/MDM: Orders Placed This Encounter  Procedures   Gastrointestinal Panel by PCR , Stool   Calprotectin, Fecal   MR ABDOMEN WO CONTRAST   MR PELVIS WO CONTRAST   Urinalysis, Routine w reflex microscopic -Urine, Clean Catch   CBC   Comprehensive metabolic panel   Enteric precautions (UV disinfection) C difficile, Norovirus    No orders of the defined types were placed in this encounter.   Reviewed pt  previous imaging, including abdominal MRI in 2024 and mesenteric complete 06/2023.    NST reviewed and appropriate for gestational age Cervix closed, no evidence of PTL Consult GI, spoke with Dr General Kenner who recommended CBC to evaluate WBCs, GI pathogen panel, fecal calprotectin, and consider imaging if pain severe, or WBCs elevated. Discussed MRI vs CT vs KUB and recommendation to order MRI if imaging warranted.  If no acute findings, pt would prefer referral to [facility] for close follow up  Report to Dr Daisey Dryer, with labs and imaging pending  Arlester Bence Certified Nurse-Midwife 11/08/2023 8:19 PM   10:51 PM Spoke with Dr Violeta Grey no acute appendicitis or other concerning features. He will preliminary tonight and push to daytime radiologist..   11:30PM Discussed normal results with patient. She reports continued lower abdominal pain, inability to tolerate PO with nausea and near vomiting. She has not passed bloody stool yet for collection. She still feels like "something is twisting inside of her." She reports pain is tolerable currently but she is very hesitant for discharge which was offered to her int the absence  12:09 AM provided stool sample. Fibrous bloody sample   Assessment and Plan: Admit to [facility] specialty due to pregnant status but this complaint is not related to her pregnancy  #Abdominal pain associated with blood stool: waxing and waning pain. Ddx includes IBD, diverticulitis, could be intussusception or meckels which are more rare in adults.  Patient is undergoing work up for mesenteric artery stenosis but these sx are more epigastric in nature.  - Monitor with serial abdominal exam - Follow up GI panel and  calprotectin - Consider GI consult - Monitor bloody stool output - repeat CBC in AM  #FWB: NST q day  # POTS- continue home medications #Chronic pain - continue home medications #Diet: clears

## 2023-11-09 NOTE — Progress Notes (Signed)
 FACULTY PRACTICE ANTEPARTUM(COMPREHENSIVE) NOTE  Angela Woodard is a 33 y.o. [phone] with Estimated Date of Delivery: 02/06/24   By  LMP [redacted]w[redacted]d  who is admitted for hematochezia.     Length of Stay:  0  Days  Date of admission:11/08/2023  Subjective: Patient resting comfortably this a.m.  She notes that she is still passing bloody mucus instead of stool.  She also notes some lower abdominal pain.  Tolerating clears without issues and requesting advance diet Patient reports the fetal movement as active. Patient reports uterine contraction  activity as none. Patient reports  vaginal bleeding as none. Patient describes fluid per vagina as None.  Vitals:  Blood pressure (!) 109/56, pulse 83, temperature 98 F (36.7 C), temperature source Oral, resp. rate 17, height 5\' 4"  (1.626 m), weight 78.8 kg, last menstrual period 05/02/2023, SpO2 99%. Vitals:   11/09/23 0148 11/09/23 1245 11/09/23 1250 11/09/23 1255  BP: (!) 109/56     Pulse: 83     Resp: 17     Temp: 98 F (36.7 C)     TempSrc: Oral     SpO2: 98% 99% 100% 99%  Weight:      Height:       Physical Examination:  General appearance - alert, well appearing, and in no distress Mental status - normal mood, behavior, speech, dress, motor activity, and thought processes Chest -normal respiratory effort, CTAB Heart - normal rate and regular rhythm Abdomen -soft, gravid, diffuse lower abdominal tenderness, no rebound or guarding Musculoskeletal -no calf tenderness bilaterally Extremities - no pedal edema noted Skin -warm and dry   Fetal Monitoring:  Baseline: 135 bpm, Variability: moderate, Accelerations: absent, and Decelerations: Absent    Nonreactive, but appropriate for current gestational age  Labs:  Results for orders placed or performed during the hospital encounter of 11/08/23 (from the past 24 hours)  Urinalysis, Routine w reflex microscopic -Urine, Clean Catch   Collection Time: 11/08/23  5:04 PM  Result Value Ref  Range   Color, Urine COLORLESS (A) YELLOW   APPearance CLEAR CLEAR   Specific Gravity, Urine 1.002 (L) 1.005 - 1.030   pH 7.0 5.0 - 8.0   Glucose, UA NEGATIVE NEGATIVE mg/dL   Hgb urine dipstick SMALL (A) NEGATIVE   Bilirubin Urine NEGATIVE NEGATIVE   Ketones, ur NEGATIVE NEGATIVE mg/dL   Protein, ur NEGATIVE NEGATIVE mg/dL   Nitrite NEGATIVE NEGATIVE   Leukocytes,Ua NEGATIVE NEGATIVE   RBC / HPF 0-5 0 - 5 RBC/hpf   WBC, UA 0-5 0 - 5 WBC/hpf   Bacteria, UA NONE SEEN NONE SEEN   Squamous Epithelial / HPF 0-5 0 - 5 /HPF  CBC   Collection Time: 11/08/23  8:16 PM  Result Value Ref Range   WBC 13.2 (H) 4.0 - 10.5 K/uL   RBC 3.52 (L) 3.87 - 5.11 MIL/uL   Hemoglobin 11.6 (L) 12.0 - 15.0 g/dL   HCT 30.8 (L) 65.7 - 84.6 %   MCV 98.6 80.0 - 100.0 fL   MCH 33.0 26.0 - 34.0 pg   MCHC 33.4 30.0 - 36.0 g/dL   RDW 96.2 95.2 - 84.1 %   Platelets 216 150 - 400 K/uL   nRBC 0.0 0.0 - 0.2 %  Comprehensive metabolic panel   Collection Time: 11/08/23  8:16 PM  Result Value Ref Range   Sodium 135 135 - 145 mmol/L   Potassium 3.7 3.5 - 5.1 mmol/L   Chloride 106 98 - 111 mmol/L   CO2 21 (L)  22 - 32 mmol/L   Glucose, Bld 92 70 - 99 mg/dL   BUN 8 6 - 20 mg/dL   Creatinine, Ser 1.61 0.44 - 1.00 mg/dL   Calcium 8.8 (L) 8.9 - 10.3 mg/dL   Total Protein 6.0 (L) 6.5 - 8.1 g/dL   Albumin 2.9 (L) 3.5 - 5.0 g/dL   AST 15 15 - 41 U/L   ALT 10 0 - 44 U/L   Alkaline Phosphatase 46 38 - 126 U/L   Total Bilirubin 0.4 0.0 - 1.2 mg/dL   GFR, Estimated >09 >60 mL/min   Anion gap 8 5 - 15  Type and screen [facility]   Collection Time: 11/09/23  1:30 AM  Result Value Ref Range   ABO/RH(D) O POS    Antibody Screen NEG    Sample Expiration      11/12/2023,2359 Performed at [facility], [address], [place], [place] [address]   CBC   Collection Time: 11/09/23  5:02 AM  Result Value Ref Range   WBC 10.8 (H) 4.0 - 10.5 K/uL   RBC 3.05 (L) 3.87 - 5.11 MIL/uL   Hemoglobin 10.1  (L) 12.0 - 15.0 g/dL   HCT 81.1 (L) 91.4 - 78.2 %   MCV 97.7 80.0 - 100.0 fL   MCH 33.1 26.0 - 34.0 pg   MCHC 33.9 30.0 - 36.0 g/dL   RDW 95.6 21.3 - 08.6 %   Platelets 167 150 - 400 K/uL   nRBC 0.0 0.0 - 0.2 %  APTT   Collection Time: 11/09/23  5:02 AM  Result Value Ref Range   aPTT 29 24 - 36 seconds  Protime-INR   Collection Time: 11/09/23  5:02 AM  Result Value Ref Range   Prothrombin Time 13.6 11.4 - 15.2 seconds   INR 1.0 0.8 - 1.2    Imaging Studies:    MR ABDOMEN WO CONTRAST Result Date: 11/09/2023 CLINICAL DATA:  Evaluate for acute appendicitis. EXAM: MRI ABDOMEN AND PELVIS WITHOUT CONTRAST TECHNIQUE: Multiplanar multisequence MR imaging of the abdomen and pelvis was performed. No intravenous contrast was administered. COMPARISON:  MRI 12/30/2022 FINDINGS: COMBINED FINDINGS FOR BOTH MR ABDOMEN AND PELVIS Lower chest: No acute findings. Hepatobiliary: No mass or other parenchymal abnormality identified. Pancreas: No mass, inflammatory changes, or other parenchymal abnormality identified. Spleen:  Within normal limits in size and appearance. Adrenals/Urinary Tract: No masses identified. No evidence of hydronephrosis. Stomach/Bowel: Stomach appears nondistended. The appendix is visualized and appears within normal limits measuring 5 mm in diameter without surrounding inflammatory soft tissue stranding. No pathologic dilatation of the large or small bowel loops. Equivocal wall thickening involving the descending colon without surrounding free fluid or significant soft tissue stranding. Vascular/Lymphatic: Normal caliber of the abdominal aorta. No abdominopelvic adenopathy identified. Reproductive: Gravid uterus compatible with [redacted] week gestation. Other:  No significant free fluid.  No fluid collections. Musculoskeletal: No suspicious bone lesions identified. IMPRESSION: 1. No evidence for acute appendicitis. 2. Equivocal wall thickening involving the descending colon without surrounding free  fluid or significant soft tissue stranding. Correlate for any clinical signs or symptoms of colitis. 3. Gravid uterus compatible with [redacted] week gestation. Electronically Signed   By: Kimberley Penman M.D.   On: 11/09/2023 06:47   MR PELVIS WO CONTRAST Result Date: 11/09/2023 CLINICAL DATA:  Evaluate for acute appendicitis. EXAM: MRI ABDOMEN AND PELVIS WITHOUT CONTRAST TECHNIQUE: Multiplanar multisequence MR imaging of the abdomen and pelvis was performed. No intravenous contrast was administered. COMPARISON:  MRI 12/30/2022 FINDINGS: COMBINED FINDINGS FOR BOTH MR ABDOMEN AND PELVIS Lower chest: No acute findings. Hepatobiliary: No mass or other parenchymal abnormality identified. Pancreas: No mass, inflammatory changes, or other parenchymal abnormality identified. Spleen:  Within normal limits in size and appearance. Adrenals/Urinary Tract: No masses identified. No evidence of hydronephrosis. Stomach/Bowel: Stomach appears nondistended. The appendix is visualized and appears within normal limits measuring 5 mm in diameter without surrounding inflammatory soft tissue stranding. No pathologic dilatation of the large or small bowel loops. Equivocal wall thickening involving the descending colon without surrounding free fluid or significant soft tissue stranding. Vascular/Lymphatic: Normal caliber of the abdominal aorta. No abdominopelvic adenopathy identified. Reproductive: Gravid uterus compatible with [redacted] week gestation. Other:  No significant free fluid.  No fluid collections. Musculoskeletal: No suspicious bone lesions identified. IMPRESSION: 1. No evidence for acute appendicitis. 2. Equivocal wall thickening involving the descending colon without surrounding free fluid or significant soft tissue stranding. Correlate for any clinical signs or symptoms of colitis. 3. Gravid uterus compatible with [redacted] week gestation. Electronically Signed   By: Kimberley Penman M.D.   On: 11/09/2023 06:47     Medications:  Scheduled   amphetamine-dextroamphetamine  20 mg Oral Daily   cyclobenzaprine   5 mg Oral QHS   docusate sodium  100 mg Oral Daily   famotidine  20 mg Oral TID   gabapentin   400 mg Oral TID   loratadine  10 mg Oral Daily   magnesium oxide  200 mg Oral Daily   [START ON 11/10/2023] montelukast   10 mg Oral QHS   Naltrexone  2 mg Oral QHS   prenatal multivitamin  1 tablet Oral Q1200   propranolol  10 mg Oral TID   I have reviewed the patient's current medications.  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   Difficulty swallowing 01/20/2023   Pes planus of both feet 01/20/2023   History of seizure disorder 11/20/2022   Thyroid  enlarged 08/18/2022   Ehlers-Danlos syndrome 12/09/2021   Celiac disease 05/02/2021   Fibromyalgia 05/02/2021   Cardiac disease during pregnancy in second trimester 05/01/2021   Postural orthostatic tachycardia syndrome 09/03/2020   IgA nephropathy 09/03/2020   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 further management pending their recommendations  2) FWB - Appropriate for current gestational age, will continue NST daily  3) Maternal care -POTS, chronic pain Continue home medication   DISP: Await recommendations from GI  Jaidev Sanger, DO Attending Obstetrician & Gynecologist, [facility] [employer], [facility]

## 2023-11-09 NOTE — Plan of Care (Signed)
  Problem: Education: Goal: Knowledge of disease or condition will improve Outcome: Progressing Goal: Knowledge of the prescribed therapeutic regimen will improve Outcome: Progressing Goal: Individualized Educational Video(s) Outcome: Progressing   Problem: Clinical Measurements: Goal: Complications related to the disease process, condition or treatment will be avoided or minimized Outcome: Progressing   Problem: Education: Goal: Knowledge of General Education information will improve Description: Including pain rating scale, medication(s)/side effects and non-pharmacologic comfort measures Outcome: Progressing   Problem: Health Behavior/Discharge Planning: Goal: Ability to manage health-related needs will improve Outcome: Progressing   Problem: Clinical Measurements: Goal: Ability to maintain clinical measurements within normal limits will improve Outcome: Progressing Goal: Will remain free from infection Outcome: Progressing Goal: Diagnostic test results will improve Outcome: Progressing Goal: Respiratory complications will improve Outcome: Progressing Goal: Cardiovascular complication will be avoided Outcome: Progressing   Problem: Activity: Goal: Risk for activity intolerance will decrease Outcome: Progressing   Problem: Nutrition: Goal: Adequate nutrition will be maintained Outcome: Progressing   Problem: Coping: Goal: Level of anxiety will decrease Outcome: Progressing   Problem: Elimination: Goal: Will not experience complications related to bowel motility Outcome: Progressing Goal: Will not experience complications related to urinary retention Outcome: Progressing   Problem: Pain Managment: Goal: General experience of comfort will improve and/or be controlled Outcome: Progressing   Problem: Safety: Goal: Ability to remain free from injury will improve Outcome: Progressing   Problem: Skin Integrity: Goal: Risk for impaired skin integrity will  decrease Outcome: Progressing   Problem: Education: Goal: Knowledge of disease or condition will improve Outcome: Progressing Goal: Knowledge of the prescribed therapeutic regimen will improve Outcome: Progressing Goal: Individualized Educational Video(s) Outcome: Progressing   Problem: Clinical Measurements: Goal: Complications related to the disease process, condition or treatment will be avoided or minimized Outcome: Progressing   Problem: Education: Goal: Knowledge of General Education information will improve Description: Including pain rating scale, medication(s)/side effects and non-pharmacologic comfort measures Outcome: Progressing   Problem: Health Behavior/Discharge Planning: Goal: Ability to manage health-related needs will improve Outcome: Progressing   Problem: Clinical Measurements: Goal: Ability to maintain clinical measurements within normal limits will improve Outcome: Progressing Goal: Will remain free from infection Outcome: Progressing Goal: Diagnostic test results will improve Outcome: Progressing Goal: Respiratory complications will improve Outcome: Progressing Goal: Cardiovascular complication will be avoided Outcome: Progressing   Problem: Activity: Goal: Risk for activity intolerance will decrease Outcome: Progressing   Problem: Nutrition: Goal: Adequate nutrition will be maintained Outcome: Progressing   Problem: Coping: Goal: Level of anxiety will decrease Outcome: Progressing   Problem: Elimination: Goal: Will not experience complications related to bowel motility Outcome: Progressing Goal: Will not experience complications related to urinary retention Outcome: Progressing   Problem: Pain Managment: Goal: General experience of comfort will improve and/or be controlled Outcome: Progressing   Problem: Safety: Goal: Ability to remain free from injury will improve Outcome: Progressing   Problem: Skin Integrity: Goal: Risk for  impaired skin integrity will decrease Outcome: Progressing

## 2023-11-10 ENCOUNTER — Other Ambulatory Visit: Payer: Self-pay

## 2023-11-10 ENCOUNTER — Other Ambulatory Visit: Payer: Self-pay | Admitting: Obstetrics and Gynecology

## 2023-11-10 DIAGNOSIS — Z3A27 27 weeks gestation of pregnancy: Secondary | ICD-10-CM

## 2023-11-10 DIAGNOSIS — K559 Vascular disorder of intestine, unspecified: Secondary | ICD-10-CM

## 2023-11-10 DIAGNOSIS — K921 Melena: Secondary | ICD-10-CM

## 2023-11-10 DIAGNOSIS — K55059 Acute (reversible) ischemia of intestine, part and extent unspecified: Secondary | ICD-10-CM | POA: Diagnosis not present

## 2023-11-10 DIAGNOSIS — O26892 Other specified pregnancy related conditions, second trimester: Secondary | ICD-10-CM | POA: Diagnosis not present

## 2023-11-10 LAB — CBC WITH DIFFERENTIAL/PLATELET
Abs Immature Granulocytes: 0.05 10*3/uL (ref 0.00–0.07)
Basophils Absolute: 0 10*3/uL (ref 0.0–0.1)
Basophils Relative: 0 %
Eosinophils Absolute: 0.1 10*3/uL (ref 0.0–0.5)
Eosinophils Relative: 1 %
HCT: 31.9 % — ABNORMAL LOW (ref 36.0–46.0)
Hemoglobin: 10.9 g/dL — ABNORMAL LOW (ref 12.0–15.0)
Immature Granulocytes: 1 %
Lymphocytes Relative: 31 %
Lymphs Abs: 3 10*3/uL (ref 0.7–4.0)
MCH: 33.1 pg (ref 26.0–34.0)
MCHC: 34.2 g/dL (ref 30.0–36.0)
MCV: 97 fL (ref 80.0–100.0)
Monocytes Absolute: 0.8 10*3/uL (ref 0.1–1.0)
Monocytes Relative: 8 %
Neutro Abs: 5.7 10*3/uL (ref 1.7–7.7)
Neutrophils Relative %: 59 %
Platelets: 192 10*3/uL (ref 150–400)
RBC: 3.29 MIL/uL — ABNORMAL LOW (ref 3.87–5.11)
RDW: 12.6 % (ref 11.5–15.5)
WBC: 9.6 10*3/uL (ref 4.0–10.5)
nRBC: 0 % (ref 0.0–0.2)

## 2023-11-10 MED ORDER — PROPRANOLOL HCL 20 MG PO TABS
20.0000 mg | ORAL_TABLET | Freq: Three times a day (TID) | ORAL | Status: DC
  Administered 2023-11-10: 20 mg via ORAL
  Filled 2023-11-10 (×3): qty 1

## 2023-11-10 MED ORDER — FAMOTIDINE 20 MG PO TABS: 20.0000 mg | ORAL_TABLET | Freq: Three times a day (TID) | ORAL | 2 refills | Status: DC

## 2023-11-10 MED ORDER — MIDODRINE HCL 2.5 MG PO TABS
2.5000 mg | ORAL_TABLET | Freq: Three times a day (TID) | ORAL | Status: DC
  Administered 2023-11-10 (×2): 2.5 mg via ORAL
  Filled 2023-11-10 (×3): qty 1

## 2023-11-10 MED ORDER — CYCLOBENZAPRINE HCL 5 MG PO TABS: 5.0000 mg | ORAL_TABLET | Freq: Every evening | ORAL | 0 refills | Status: AC | PRN

## 2023-11-10 MED ORDER — MIDODRINE HCL 2.5 MG PO TABS: 2.5000 mg | ORAL_TABLET | Freq: Three times a day (TID) | ORAL | 0 refills | Status: AC

## 2023-11-10 MED ORDER — CYCLOBENZAPRINE HCL 10 MG PO TABS
10.0000 mg | ORAL_TABLET | Freq: Three times a day (TID) | ORAL | Status: DC | PRN
  Administered 2023-11-10: 10 mg via ORAL
  Filled 2023-11-10: qty 1

## 2023-11-10 NOTE — Plan of Care (Signed)
  Problem: Education: Goal: Knowledge of disease or condition will improve 11/10/2023 1317 by Juanice Norfolk, RN Outcome: Completed/Met 11/10/2023 1119 by Juanice Norfolk, RN Outcome: Progressing Goal: Knowledge of the prescribed therapeutic regimen will improve 11/10/2023 1317 by Juanice Norfolk, RN Outcome: Completed/Met 11/10/2023 1119 by Juanice Norfolk, RN Outcome: Progressing Goal: Individualized Educational Video(s) 11/10/2023 1317 by Juanice Norfolk, RN Outcome: Completed/Met 11/10/2023 1119 by Juanice Norfolk, RN Outcome: Progressing   Problem: Clinical Measurements: Goal: Complications related to the disease process, condition or treatment will be avoided or minimized 11/10/2023 1317 by Juanice Norfolk, RN Outcome: Completed/Met 11/10/2023 1119 by Juanice Norfolk, RN Outcome: Progressing   Problem: Education: Goal: Knowledge of General Education information will improve Description: Including pain rating scale, medication(s)/side effects and non-pharmacologic comfort measures 11/10/2023 1317 by Juanice Norfolk, RN Outcome: Completed/Met 11/10/2023 1119 by Juanice Norfolk, RN Outcome: Progressing   Problem: Health Behavior/Discharge Planning: Goal: Ability to manage health-related needs will improve 11/10/2023 1317 by Juanice Norfolk, RN Outcome: Completed/Met 11/10/2023 1119 by Juanice Norfolk, RN Outcome: Progressing   Problem: Clinical Measurements: Goal: Ability to maintain clinical measurements within normal limits will improve 11/10/2023 1317 by Juanice Norfolk, RN Outcome: Completed/Met 11/10/2023 1119 by Juanice Norfolk, RN Outcome: Progressing Goal: Will remain free from infection 11/10/2023 1317 by Juanice Norfolk, RN Outcome: Completed/Met 11/10/2023 1119 by Juanice Norfolk, RN Outcome: Progressing Goal: Diagnostic test results will improve 11/10/2023 1317 by Juanice Norfolk, RN Outcome: Completed/Met 11/10/2023 1119 by Juanice Norfolk, RN Outcome: Progressing Goal: Respiratory complications will  improve 11/10/2023 1317 by Juanice Norfolk, RN Outcome: Completed/Met 11/10/2023 1119 by Juanice Norfolk, RN Outcome: Progressing Goal: Cardiovascular complication will be avoided 11/10/2023 1317 by Juanice Norfolk, RN Outcome: Completed/Met 11/10/2023 1119 by Juanice Norfolk, RN Outcome: Progressing   Problem: Activity: Goal: Risk for activity intolerance will decrease 11/10/2023 1317 by Juanice Norfolk, RN Outcome: Completed/Met 11/10/2023 1119 by Juanice Norfolk, RN Outcome: Progressing   Problem: Nutrition: Goal: Adequate nutrition will be maintained 11/10/2023 1317 by Juanice Norfolk, RN Outcome: Completed/Met 11/10/2023 1119 by Juanice Norfolk, RN Outcome: Progressing   Problem: Coping: Goal: Level of anxiety will decrease 11/10/2023 1317 by Juanice Norfolk, RN Outcome: Completed/Met 11/10/2023 1119 by Juanice Norfolk, RN Outcome: Progressing   Problem: Elimination: Goal: Will not experience complications related to bowel motility 11/10/2023 1317 by Juanice Norfolk, RN Outcome: Completed/Met 11/10/2023 1119 by Juanice Norfolk, RN Outcome: Progressing Goal: Will not experience complications related to urinary retention 11/10/2023 1317 by Juanice Norfolk, RN Outcome: Completed/Met 11/10/2023 1119 by Juanice Norfolk, RN Outcome: Progressing   Problem: Pain Managment: Goal: General experience of comfort will improve and/or be controlled 11/10/2023 1317 by Juanice Norfolk, RN Outcome: Completed/Met 11/10/2023 1119 by Juanice Norfolk, RN Outcome: Progressing   Problem: Safety: Goal: Ability to remain free from injury will improve 11/10/2023 1317 by Juanice Norfolk, RN Outcome: Completed/Met 11/10/2023 1119 by Juanice Norfolk, RN Outcome: Progressing   Problem: Skin Integrity: Goal: Risk for impaired skin integrity will decrease 11/10/2023 1317 by Juanice Norfolk, RN Outcome: Completed/Met 11/10/2023 1119 by Juanice Norfolk, RN Outcome: Progressing

## 2023-11-10 NOTE — Discharge Summary (Signed)
 Physician Discharge Summary  Patient ID: Angela Woodard MRN: [id number] DOB/AGE: 12/15/1990 33 y.o.  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  Pt seen by [facility] suspect ischemic colitis due to POTS.  CBC remained stable and fetal well being reassuring.  Plan for outpatient follow up.  Consults: GI  Significant Diagnostic Studies: labs:  Results for orders placed or performed during the hospital encounter of 11/08/23 (from the past 48 hours)  Urinalysis, Routine w reflex microscopic -Urine, Clean Catch     Status: Abnormal   Collection Time: 11/08/23  5:04 PM  Result Value Ref Range   Color, Urine COLORLESS (A) YELLOW   APPearance CLEAR CLEAR   Specific Gravity, Urine 1.002 (L) 1.005 - 1.030   pH 7.0 5.0 - 8.0   Glucose, UA NEGATIVE NEGATIVE mg/dL   Hgb urine dipstick SMALL (A) NEGATIVE   Bilirubin Urine NEGATIVE NEGATIVE   Ketones, ur NEGATIVE NEGATIVE mg/dL   Protein, ur NEGATIVE NEGATIVE mg/dL   Nitrite NEGATIVE NEGATIVE   Leukocytes,Ua NEGATIVE NEGATIVE   RBC / HPF 0-5 0 - 5 RBC/hpf   WBC, UA 0-5 0 - 5 WBC/hpf   Bacteria, UA NONE SEEN NONE SEEN   Squamous Epithelial / HPF 0-5 0 - 5 /HPF    Comment: Performed at [facility], [address] [place], [place] [address]  CBC     Status: Abnormal   Collection Time: 11/08/23  8:16 PM  Result Value Ref Range   WBC 13.2 (H) 4.0 - 10.5 K/uL   RBC 3.52 (L) 3.87 - 5.11 MIL/uL   Hemoglobin 11.6 (L) 12.0 - 15.0 g/dL   HCT 64.4 (L) 03.4 - 74.2 %   MCV 98.6 80.0 - 100.0 fL   MCH 33.0 26.0 - 34.0 pg   MCHC 33.4 30.0 -  36.0 g/dL   RDW 59.5 63.8 - 75.6 %   Platelets 216 150 - 400 K/uL   nRBC 0.0 0.0 - 0.2 %    Comment: Performed at [facility], [address] [place], [place] [address]  Comprehensive metabolic panel     Status: Abnormal   Collection Time: 11/08/23  8:16 PM  Result Value Ref Range   Sodium 135 135 - 145 mmol/L   Potassium 3.7 3.5 - 5.1 mmol/L   Chloride 106 98 - 111 mmol/L   CO2 21 (L) 22 - 32 mmol/L   Glucose, Bld 92 70 - 99 mg/dL    Comment: Glucose reference range applies only to samples taken after fasting for at least 8 hours.   BUN 8 6 - 20 mg/dL   Creatinine, Ser 5.18 0.44 - 1.00 mg/dL   Calcium 8.8 (L) 8.9 - 10.3 mg/dL   Total Protein 6.0 (L) 6.5 - 8.1 g/dL   Albumin 2.9 (L) 3.5 - 5.0 g/dL   AST 15 15 - 41 U/L   ALT 10 0 - 44 U/L   Alkaline Phosphatase 46 38 - 126 U/L   Total Bilirubin 0.4 0.0 - 1.2 mg/dL   GFR, Estimated >84 >16 mL/min    Comment: (NOTE) Calculated using the CKD-EPI Creatinine Equation (2021)  Anion gap 8 5 - 15    Comment: Performed at [facility], [address] [place], [place] [address]  Gastrointestinal Panel by PCR , Stool     Status: None   Collection Time: 11/09/23 12:00 AM   Specimen: Stool  Result Value Ref Range   Campylobacter species NOT DETECTED NOT DETECTED   Plesimonas shigelloides NOT DETECTED NOT DETECTED   Salmonella species NOT DETECTED NOT DETECTED   Yersinia enterocolitica NOT DETECTED NOT DETECTED   Vibrio species NOT DETECTED NOT DETECTED   Vibrio cholerae NOT DETECTED NOT DETECTED   Enteroaggregative E coli (EAEC) NOT DETECTED NOT DETECTED   Enteropathogenic E coli (EPEC) NOT DETECTED NOT DETECTED   Enterotoxigenic E coli (ETEC) NOT DETECTED NOT DETECTED   Shiga like toxin producing E coli (STEC) NOT DETECTED NOT DETECTED   Shigella/Enteroinvasive E coli (EIEC) NOT DETECTED NOT DETECTED   Cryptosporidium NOT DETECTED NOT DETECTED   Cyclospora cayetanensis NOT DETECTED NOT DETECTED   Entamoeba  histolytica NOT DETECTED NOT DETECTED   Giardia lamblia NOT DETECTED NOT DETECTED   Adenovirus F40/41 NOT DETECTED NOT DETECTED   Astrovirus NOT DETECTED NOT DETECTED   Norovirus GI/GII NOT DETECTED NOT DETECTED   Rotavirus A NOT DETECTED NOT DETECTED   Sapovirus (I, II, IV, and V) NOT DETECTED NOT DETECTED    Comment: Performed at [facility], [address] [place], [place] [address]  Type and screen [facility]     Status: None   Collection Time: 11/09/23  1:30 AM  Result Value Ref Range   ABO/RH(D) O POS    Antibody Screen NEG    Sample Expiration      11/12/2023,2359 Performed at [facility], [address], [place], [place] [address]   CBC     Status: Abnormal   Collection Time: 11/09/23  5:02 AM  Result Value Ref Range   WBC 10.8 (H) 4.0 - 10.5 K/uL   RBC 3.05 (L) 3.87 - 5.11 MIL/uL   Hemoglobin 10.1 (L) 12.0 - 15.0 g/dL   HCT 65.7 (L) 84.6 - 96.2 %   MCV 97.7 80.0 - 100.0 fL   MCH 33.1 26.0 - 34.0 pg   MCHC 33.9 30.0 - 36.0 g/dL   RDW 95.2 84.1 - 32.4 %   Platelets 167 150 - 400 K/uL   nRBC 0.0 0.0 - 0.2 %    Comment: Performed at [facility], [address], [place], [place] [address]  APTT     Status: None   Collection Time: 11/09/23  5:02 AM  Result Value Ref Range   aPTT 29 24 - 36 seconds    Comment: Performed at [facility], [address] [place], [place] [address]  Protime-INR     Status: None   Collection Time: 11/09/23  5:02 AM  Result Value Ref Range   Prothrombin Time 13.6 11.4 - 15.2 seconds   INR 1.0 0.8 - 1.2    Comment: (NOTE) INR goal varies based on device and disease states. Performed at [facility], [address], [place], [place] [address]   CBC with Differential/Platelet     Status: Abnormal   Collection Time: 11/10/23  4:43 AM  Result Value Ref Range   WBC 9.6 4.0 - 10.5 K/uL   RBC 3.29 (L) 3.87 - 5.11 MIL/uL   Hemoglobin 10.9 (L) 12.0 - 15.0 g/dL   HCT 47.4 (L) 25.9 - 56.3 %  MCV 97.0 80.0 - 100.0 fL   MCH 33.1 26.0 - 34.0 pg   MCHC 34.2 30.0 - 36.0 g/dL   RDW 16.1 09.6 - 04.5 %   Platelets 192 150 - 400 K/uL   nRBC 0.0 0.0 - 0.2 %   Neutrophils Relative % 59 %   Neutro Abs 5.7 1.7 - 7.7 K/uL   Lymphocytes Relative 31 %   Lymphs Abs 3.0 0.7 - 4.0 K/uL   Monocytes Relative 8 %   Monocytes Absolute 0.8 0.1 - 1.0 K/uL   Eosinophils Relative 1 %   Eosinophils Absolute 0.1 0.0 - 0.5 K/uL   Basophils Relative 0 %   Basophils Absolute 0.0 0.0 - 0.1 K/uL   Immature Granulocytes 1 %   Abs Immature Granulocytes 0.05 0.00 - 0.07 K/uL    Comment: Performed at [facility], [address], [place], [place] [address]     Treatments: IV hydration, fetal monitoring, restarting home medication  Discharge Exam: Blood pressure (!) 105/56, pulse 72, temperature 98.5 F (36.9 C), temperature source Oral, resp. rate 19, height 5\' 4"  (1.626 m), weight 78.8 kg, last menstrual period 05/02/2023, SpO2 99%.  General appearance - alert, well appearing, and in no distress Chest -normal respiratory effort Heart - normal rate Abdomen -soft, gravid, diffuse lower abdominal tenderness, no rebound or guarding Musculoskeletal -no calf tenderness bilaterally Extremities - no pedal edema noted Skin -warm and dry  Disposition: Discharge disposition: 01-Home or Self Care        Allergies as of 11/10/2023       Reactions   Relpax [eletriptan] Anaphylaxis   Throat swelling   Sulfa Antibiotics Hives, Rash   Gluten Meal    Latex Hives   Lamotrigine Rash        Medication List     STOP taking these medications    amoxicillin  875 MG tablet Commonly known as: AMOXIL    ondansetron  4 MG disintegrating tablet Commonly known as: ZOFRAN -ODT       TAKE these medications    albuterol  108 (90 Base) MCG/ACT inhaler Commonly known as: VENTOLIN  HFA Inhale 2 puffs into the lungs every 6 (six) hours as needed for wheezing or shortness of breath.    amphetamine-dextroamphetamine 20 MG tablet Commonly known as: ADDERALL Take 20 mg by mouth 2 (two) times daily. 20mg  in the morning and 10mg  at night   cetirizine 10 MG tablet Commonly known as: ZYRTEC Take 10 mg by mouth daily.   cromolyn  100 MG/5ML solution Commonly known as: GASTROCROM  TAKE 5 ML(100 MG) BY MOUTH THREE TIMES DAILY BEFORE MEALS   cyclobenzaprine  5 MG tablet Commonly known as: FLEXERIL  Take 1 tablet (5 mg total) by mouth at bedtime as needed for muscle spasms. What changed: See the new instructions.   EPINEPHrine  0.3 mg/0.3 mL Soaj injection Commonly known as: EPI-PEN Inject 0.3 mg into the muscle as needed for anaphylaxis.   famotidine 20 MG tablet Commonly known as: PEPCID Take 20 mg by mouth in the morning, at noon, and at bedtime. What changed: Another medication with the same name was added. Make sure you understand how and when to take each.   famotidine 20 MG tablet Commonly known as: PEPCID Take 1 tablet (20 mg total) by mouth 3 (three) times daily. What changed: You were already taking a medication with the same name, and this prescription was added. Make sure you understand how and when to take each.   fluticasone  50 MCG/ACT nasal spray Commonly known as:  FLONASE  Place 2 sprays into both nostrils daily.   gabapentin  400 MG capsule Commonly known as: NEURONTIN  Take 1 capsule (400 mg total) by mouth 3 (three) times daily.   magnesium oxide 400 MG tablet Commonly known as: MAG-OX Take 200 mg by mouth daily.   midodrine 2.5 MG tablet Commonly known as: PROAMATINE Take 1 tablet (2.5 mg total) by mouth 3 (three) times daily with meals. What changed: additional instructions   montelukast  10 MG tablet Commonly known as: SINGULAIR  Take 1 tablet (10 mg total) by mouth at bedtime.   naltrexone 50 MG tablet Commonly known as: DEPADE Take 2 mg by mouth at bedtime.   NONFORMULARY OR COMPOUNDED ITEM Take 1-2 mg by mouth daily. Naltrexone    propranolol 10 MG tablet Commonly known as: INDERAL Take 10 mg by mouth 3 (three) times daily.        Follow-up Information     Clinic, [facility].   Contact information: [address] [place] [address] [phone]         [facility]. Schedule an appointment as soon as possible for a visit.   Specialty: Gastroenterology Why: Please call to schedule a follow up Contact information: [address] [place]  [address] [phone]                Signed: Christel Cousins 11/10/2023, 12:25 PM

## 2023-11-10 NOTE — Progress Notes (Signed)
 FACULTY PRACTICE ANTEPARTUM(COMPREHENSIVE) NOTE  CHANYAH ISAAK is a 33 y.o. [phone] with Estimated Date of Delivery: 02/06/24   By  LMP [redacted]w[redacted]d  who is admitted for hematochezia.     Length of Stay:  1  Days  Date of admission:11/08/2023  Subjective: Resting comfortably. Notes ongoing abdominal pain/cramping. Had bloody bowel movements overnight.   Patient reports the fetal movement as active. Patient reports uterine contraction  activity as none. Patient reports  vaginal bleeding as none. Patient describes fluid per vagina as None.  Vitals:  Blood pressure (!) 96/52, pulse 82, temperature 98 F (36.7 C), temperature source Oral, resp. rate 18, height 5\' 4"  (1.626 m), weight 78.8 kg, last menstrual period 05/02/2023, SpO2 100%. Vitals:   11/09/23 1545 11/09/23 2003 11/09/23 2332 11/10/23 0746  BP:  (!) 109/59 (!) 106/51 (!) 96/52  Pulse:  77 64 82  Resp:  18 18 18   Temp:  98.3 F (36.8 C) 98 F (36.7 C) 98 F (36.7 C)  TempSrc:  Oral Oral Oral  SpO2: 98% 100% 100% 100%  Weight:      Height:       Physical Examination:  General appearance - alert, well appearing, and in no distress Chest -normal respiratory effort Heart - normal rate Abdomen -soft, gravid, diffuse lower abdominal tenderness, no rebound or guarding Musculoskeletal -no calf tenderness bilaterally Extremities - no pedal edema noted Skin -warm and dry   Fetal Monitoring:  Baseline: 140 bpm, Variability: moderate, Accelerations: absent, and Decelerations: Absent   - discontinuous and non reactive, still on monitor. Continue to monitor.   Labs:  Results for orders placed or performed during the hospital encounter of 11/08/23 (from the past 24 hours)  CBC with Differential/Platelet   Collection Time: 11/10/23  4:43 AM  Result Value Ref Range   WBC 9.6 4.0 - 10.5 K/uL   RBC 3.29 (L) 3.87 - 5.11 MIL/uL   Hemoglobin 10.9 (L) 12.0 - 15.0 g/dL   HCT 30.8 (L) 65.7 - 84.6 %   MCV 97.0 80.0 - 100.0 fL   MCH 33.1  26.0 - 34.0 pg   MCHC 34.2 30.0 - 36.0 g/dL   RDW 96.2 95.2 - 84.1 %   Platelets 192 150 - 400 K/uL   nRBC 0.0 0.0 - 0.2 %   Neutrophils Relative % 59 %   Neutro Abs 5.7 1.7 - 7.7 K/uL   Lymphocytes Relative 31 %   Lymphs Abs 3.0 0.7 - 4.0 K/uL   Monocytes Relative 8 %   Monocytes Absolute 0.8 0.1 - 1.0 K/uL   Eosinophils Relative 1 %   Eosinophils Absolute 0.1 0.0 - 0.5 K/uL   Basophils Relative 0 %   Basophils Absolute 0.0 0.0 - 0.1 K/uL   Immature Granulocytes 1 %   Abs Immature Granulocytes 0.05 0.00 - 0.07 K/uL    Imaging Studies:    MR ABDOMEN WO CONTRAST Result Date: 11/09/2023 CLINICAL DATA:  Evaluate for acute appendicitis. EXAM: MRI ABDOMEN AND PELVIS WITHOUT CONTRAST TECHNIQUE: Multiplanar multisequence MR imaging of the abdomen and pelvis was performed. No intravenous contrast was administered. COMPARISON:  MRI 12/30/2022 FINDINGS: COMBINED FINDINGS FOR BOTH MR ABDOMEN AND PELVIS Lower chest: No acute findings. Hepatobiliary: No mass or other parenchymal abnormality identified. Pancreas: No mass, inflammatory changes, or other parenchymal abnormality identified. Spleen:  Within normal limits in size and appearance. Adrenals/Urinary Tract: No masses identified. No evidence of hydronephrosis. Stomach/Bowel: Stomach appears nondistended. The appendix is visualized and appears within normal limits measuring  5 mm in diameter without surrounding inflammatory soft tissue stranding. No pathologic dilatation of the large or small bowel loops. Equivocal wall thickening involving the descending colon without surrounding free fluid or significant soft tissue stranding. Vascular/Lymphatic: Normal caliber of the abdominal aorta. No abdominopelvic adenopathy identified. Reproductive: Gravid uterus compatible with [redacted] week gestation. Other:  No significant free fluid.  No fluid collections. Musculoskeletal: No suspicious bone lesions identified. IMPRESSION: 1. No evidence for acute appendicitis. 2.  Equivocal wall thickening involving the descending colon without surrounding free fluid or significant soft tissue stranding. Correlate for any clinical signs or symptoms of colitis. 3. Gravid uterus compatible with [redacted] week gestation. Electronically Signed   By: Kimberley Penman M.D.   On: 11/09/2023 06:47   MR PELVIS WO CONTRAST Result Date: 11/09/2023 CLINICAL DATA:  Evaluate for acute appendicitis. EXAM: MRI ABDOMEN AND PELVIS WITHOUT CONTRAST TECHNIQUE: Multiplanar multisequence MR imaging of the abdomen and pelvis was performed. No intravenous contrast was administered. COMPARISON:  MRI 12/30/2022 FINDINGS: COMBINED FINDINGS FOR BOTH MR ABDOMEN AND PELVIS Lower chest: No acute findings. Hepatobiliary: No mass or other parenchymal abnormality identified. Pancreas: No mass, inflammatory changes, or other parenchymal abnormality identified. Spleen:  Within normal limits in size and appearance. Adrenals/Urinary Tract: No masses identified. No evidence of hydronephrosis. Stomach/Bowel: Stomach appears nondistended. The appendix is visualized and appears within normal limits measuring 5 mm in diameter without surrounding inflammatory soft tissue stranding. No pathologic dilatation of the large or small bowel loops. Equivocal wall thickening involving the descending colon without surrounding free fluid or significant soft tissue stranding. Vascular/Lymphatic: Normal caliber of the abdominal aorta. No abdominopelvic adenopathy identified. Reproductive: Gravid uterus compatible with [redacted] week gestation. Other:  No significant free fluid.  No fluid collections. Musculoskeletal: No suspicious bone lesions identified. IMPRESSION: 1. No evidence for acute appendicitis. 2. Equivocal wall thickening involving the descending colon without surrounding free fluid or significant soft tissue stranding. Correlate for any clinical signs or symptoms of colitis. 3. Gravid uterus compatible with [redacted] week gestation. Electronically Signed    By: Kimberley Penman M.D.   On: 11/09/2023 06:47     Medications:  Scheduled  amphetamine-dextroamphetamine  20 mg Oral Daily   cromolyn   100 mg Oral TID   cyclobenzaprine   5 mg Oral QHS   docusate sodium  100 mg Oral Daily   famotidine  20 mg Oral TID   gabapentin   400 mg Oral TID   loratadine  10 mg Oral Daily   magnesium oxide  200 mg Oral Daily   midodrine  2.5 mg Oral TID with meals   montelukast   10 mg Oral QHS   Naltrexone  2 mg Oral QHS   prenatal multivitamin  1 tablet Oral Q1200   propranolol  20 mg Oral TID   I have reviewed the patient's current medications.  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   Difficulty swallowing 01/20/2023   Pes planus of both feet 01/20/2023   History of seizure disorder 11/20/2022   Thyroid  enlarged 08/18/2022   Ehlers-Danlos syndrome 12/09/2021   Celiac disease 05/02/2021   Fibromyalgia 05/02/2021   Cardiac disease during pregnancy in second trimester 05/01/2021   Postural orthostatic tachycardia syndrome 09/03/2020   IgA nephropathy 09/03/2020  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 Likely appropriate for discharge home today pending PM check  2) FWB - Continue to monitor, non reactive this morning  3) Maternal care -POTS, chronic pain Continue home  medication  DISPO: PM check, likely discharge home today  Loralyn Rochester, MD Obstetrician & Gynecologist, [facility] for [employer], [facility]

## 2023-11-10 NOTE — Plan of Care (Signed)

## 2023-11-10 NOTE — Progress Notes (Signed)
 Outpatient GI referral placed

## 2023-11-11 ENCOUNTER — Telehealth: Payer: Self-pay | Admitting: Internal Medicine

## 2023-11-11 LAB — CALPROTECTIN, FECAL: Calprotectin, Fecal: 896 ug/g — ABNORMAL HIGH (ref 0–120)

## 2023-11-11 NOTE — Telephone Encounter (Signed)
 Good afternoon Dr. Bridgett Camps  The following patient is requesting to be seen for Ischemic colitis. She was seen in November by [facility] and has only got worse. Records are available in Epic care everywhere. Please review and advise of scheduling. Thank you.

## 2023-11-12 NOTE — Telephone Encounter (Signed)
 Request received to transfer GI care from outside practice to [facility].  We appreciate the interest in our practice, however at this time due to high demand from patients without established GI providers we cannot accommodate this transfer.   She should contact her GI provider for additional recommendations

## 2023-11-13 ENCOUNTER — Ambulatory Visit (INDEPENDENT_AMBULATORY_CARE_PROVIDER_SITE_OTHER): Admitting: Allergy

## 2023-11-13 ENCOUNTER — Encounter: Payer: Self-pay | Admitting: Allergy

## 2023-11-13 ENCOUNTER — Other Ambulatory Visit: Payer: Self-pay

## 2023-11-13 VITALS — BP 112/64 | HR 88 | Temp 98.4°F | Wt 171.3 lb

## 2023-11-13 DIAGNOSIS — D8940 Mast cell activation, unspecified: Secondary | ICD-10-CM | POA: Diagnosis not present

## 2023-11-13 MED ORDER — MONTELUKAST SODIUM 10 MG PO TABS: 10.0000 mg | ORAL_TABLET | Freq: Every day | ORAL | 1 refills | Status: AC

## 2023-11-13 MED ORDER — CROMOLYN SODIUM 100 MG/5ML PO CONC: 100.0000 mg | Freq: Three times a day (TID) | ORAL | 5 refills | Status: AC

## 2023-11-13 MED ORDER — CETIRIZINE HCL 10 MG PO TABS: 10.0000 mg | ORAL_TABLET | Freq: Three times a day (TID) | ORAL | 5 refills | Status: AC

## 2023-11-13 MED ORDER — FLUTICASONE PROPIONATE 50 MCG/ACT NA SUSP: 2.0000 | Freq: Every day | NASAL | 5 refills | Status: AC

## 2023-11-13 MED ORDER — ALBUTEROL SULFATE HFA 108 (90 BASE) MCG/ACT IN AERS: 2.0000 | INHALATION_SPRAY | RESPIRATORY_TRACT | 1 refills | Status: AC | PRN

## 2023-11-13 MED ORDER — NEFFY 2 MG/0.1ML NA SOLN: 1.0000 | Freq: Every day | NASAL | 1 refills | Status: DC | PRN

## 2023-11-13 MED ORDER — FAMOTIDINE 20 MG PO TABS: 20.0000 mg | ORAL_TABLET | Freq: Three times a day (TID) | ORAL | 5 refills | Status: AC

## 2023-11-13 NOTE — Progress Notes (Signed)
 Follow-up Note  RE: Angela Woodard MRN: [id number] DOB: 1990-09-07 Date of Office Visit: 11/13/2023   History of present illness: Angela Woodard is a 33 y.o. female presenting today for follow-up of MCAS.  She was last seen in the office on 01/15/2023 by myself.  She presents today with her best friend. Discussed the use of AI scribe software for clinical note transcription with the patient, who gave verbal consent to proceed.  She has experienced significant improvement in gastrointestinal symptoms with cromolyn , which she takes prior to meals. Previously, eating resulted in severe symptoms, but now they are much better controlled. Missing a dose leads to noticeable symptoms, indicating the medication's effectiveness.   She has episodes of respiratory symptoms, including a recent incident where she woke up unable to breathe well, feeling as though her lungs were full. Her blood oxygen level was 94% for two days following this event. She used her albuterol  inhaler during this episode, which she has used infrequently over the past few months. She also experiences episodes of throat swelling and difficulty breathing, which improve with Benadryl.  She is also having more episodes of facial flushing.  No recent use of her EpiPen .  She is however pregnant at this time.  She is [redacted] weeks pregnant and reports increased allergy symptoms, including chronic sinus infections and ear infections, occurring once a month or every other month. She recalls similar issues during childhood. She has been experiencing more frequent allergy flares, with increased rash and breathing issues.   She was recently hospitalized due to gastrointestinal issues, where she experienced a significant drop in blood pressure likely secondary to segmental ischemic colitis. She has been restarted on her blood pressure medication to prevent recurrence.  She is currently taking cetirizine three times a day, famotidine  three  times a day, Flonase  twice a day, Astepro, and Singulair  at night; in addition to cromolyn  discussed above. Being off Singulair  just for a few days she could tell a difference in her breathing.  She does note with the urine collection ordered after her last visit there was a period of time where it was not kept refrigerated and she is not sure if that could have altered the results.  Review of systems: 10pt ROS negative unless noted above in HPI  Past medical/social/surgical/family history have been reviewed and are unchanged unless specifically indicated below.  No changes  Medication List: Current Outpatient Medications  Medication Sig Dispense Refill   albuterol  (VENTOLIN  HFA) 108 (90 Base) MCG/ACT inhaler Inhale 2 puffs into the lungs every 6 (six) hours as needed for wheezing or shortness of breath. 8 g 2   amphetamine -dextroamphetamine  (ADDERALL) 20 MG tablet Take 20 mg by mouth 2 (two) times daily. 20mg  in the morning and 10mg  at night     cetirizine (ZYRTEC) 10 MG tablet Take 10 mg by mouth daily.     cromolyn  (GASTROCROM ) 100 MG/5ML solution TAKE 5 ML(100 MG) BY MOUTH THREE TIMES DAILY BEFORE MEALS 480 mL 5   cyclobenzaprine  (FLEXERIL ) 5 MG tablet Take 1 tablet (5 mg total) by mouth at bedtime as needed for muscle spasms. 30 tablet 0   famotidine  (PEPCID ) 20 MG tablet Take 1 tablet (20 mg total) by mouth 3 (three) times daily. 90 tablet 2   fluticasone  (FLONASE ) 50 MCG/ACT nasal spray Place 2 sprays into both nostrils daily. 16 g 0   gabapentin  (NEURONTIN ) 400 MG capsule Take 1 capsule (400 mg total) by mouth 3 (three) times daily. 270  capsule 1   magnesium  oxide (MAG-OX) 400 MG tablet Take 200 mg by mouth daily.     midodrine  (PROAMATINE ) 2.5 MG tablet Take 1 tablet (2.5 mg total) by mouth 3 (three) times daily with meals. 90 tablet 0   montelukast  (SINGULAIR ) 10 MG tablet Take 1 tablet (10 mg total) by mouth at bedtime. 90 tablet 0   naltrexone  (DEPADE) 50 MG tablet Take 2 mg by  mouth at bedtime.     NONFORMULARY OR COMPOUNDED ITEM Take 1-2 mg by mouth daily. Naltrexone  90 each 1   propranolol  (INDERAL ) 10 MG tablet Take 10 mg by mouth 3 (three) times daily.     EPINEPHrine  0.3 mg/0.3 mL IJ SOAJ injection Inject 0.3 mg into the muscle as needed for anaphylaxis. (Patient not taking: Reported on 11/13/2023) 1 each 2   folic acid (FOLVITE) 1 MG tablet Take 1 mg by mouth daily.     Prenatal MV-Min-Fe Cbn-FA-DHA (OBTREX DHA) 29-1 & 350 MG MISC      No current facility-administered medications for this visit.     Known medication allergies: Allergies  Allergen Reactions   Relpax [Eletriptan] Anaphylaxis    Throat swelling   Sulfa Antibiotics Hives and Rash   Gluten Meal    Latex Hives   Lamotrigine Rash     Physical examination: Blood pressure 112/64, pulse 88, temperature 98.4 F (36.9 C), weight 171 lb 4.8 oz (77.7 kg), last menstrual period 05/02/2023, SpO2 97%.  General: Alert, interactive, in no acute distress. HEENT: PERRLA, TMs pearly gray, turbinates moderately edematous without discharge, post-pharynx non erythematous. Neck: Supple without lymphadenopathy. Lungs: Clear to auscultation without wheezing, rhonchi or rales. {no increased work of breathing. CV: Normal S1, S2 without murmurs. Abdomen: Gravid Skin: Warm and dry, without lesions or rashes. Extremities:  No clubbing, cyanosis or edema. Neuro:   Grossly intact.  Diagnositics/Labs: Component     Latest Ref Rng 01/15/2023 01/22/2023  D Pteronyssinus IgE     Class 0 kU/L <0.10    D Farinae IgE     Class 0 kU/L <0.10    Cat Dander IgE     Class 0 kU/L <0.10    Dog Dander IgE     Class 0 kU/L <0.10    [place] Grass IgE     Class 0 kU/L <0.10    Timothy Grass IgE     Class 0 kU/L <0.10    Johnson Grass IgE     Class 0 kU/L <0.10    Cockroach, German IgE     Class 0 kU/L <0.10    Penicillium Chrysogen IgE     Class 0 kU/L <0.10    Cladosporium Herbarum IgE     Class 0 kU/L <0.10     Aspergillus Fumigatus IgE     Class 0 kU/L <0.10    Alternaria Alternata IgE     Class 0 kU/L <0.10    Maple/Box Elder IgE     Class 0 kU/L <0.10    Common Silver Amelia Jurist IgE     Class 0 kU/L <0.10    Cedar, [place] IgE     Class 0 kU/L <0.10    Oak, White IgE     Class 0 kU/L <0.10    Elm, American IgE     Class 0 kU/L <0.10    Cottonwood IgE     Class 0 kU/L <0.10    Pecan, Hickory IgE     Class 0 kU/L <0.10    White Mulberry  IgE     Class 0 kU/L <0.10    Ragweed, Short IgE     Class 0 kU/L <0.10    Pigweed, Rough IgE     Class 0 kU/L <0.10    Sheep Sorrel IgE Qn     Class 0 kU/L <0.10    Mouse Urine IgE     Class 0 kU/L <0.10    Class Description Allergens Comment    IgE (Immunoglobulin E), Serum     6 - 495 IU/mL 16    O215-IgE Alpha-Gal     Class 0 kU/L <0.10    Beef IgE     Class 0 kU/L <0.10    Pork IgE     Class 0 kU/L <0.10    Allergen Lamb IgE     Class 0 kU/L <0.10    F001-IgE Egg White     Class 0 kU/L <0.10    F002-IgE Milk     Class 0 kU/L <0.10    Codfish IgE     Class 0 kU/L <0.10    Wheat IgE     Class 0 kU/L <0.10    Peanut, IgE     Class 0 kU/L <0.10    Soybean IgE     Class 0 kU/L <0.10    N-Methylhistamine, 24 Hr, U     30 - 200 mcg/g Cr  95   Creatinine, 24 Hour, U     603 - 1,783 mg/24 h  1,320 (C)  Collection Duration     h  24 (C)  Collection Duration     h  24 (C)  Urine Volume     mL  3,000 (C)  Urine Volume     mL  3,000 (C)  Creatinine Concent. 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  There was concern however for the urine collection not being  Refrigerated and will look into this as a potential issue with lab results.  We may need to reorder for a new collection if so. -For management of MCAS recommend you continue current high-dose antihistamine regimen: Zyrtec 3 times a day, Pepcid  3 times a day and Singulair  daily regimen for your primary antihistamine, secondary antihistamine, antileukotriene respectively. Continue Cromolyn  taken prior to meals. Cromolyn  is a mast cell stabilizer. -Continue Flonase  2 sprays each nostril daily for 1-2 weeks at a time before stopping once nasal congestion improves for maximum benefit -Have access to albuterol  inhaler for relief of symptoms of having wheezing, cough, chest tightness or shortness of breath. -Have access to an epinephrine  device in case of allergic reaction. Follow emergency action plan to follow in case of allergic reaction.  Follow-up 2 to 3 months postpartum  I appreciate the opportunity to take part in Judi's care. Please do not hesitate to contact me with questions.  Sincerely,   Catha Clink, MD Allergy/Immunology [facility]

## 2023-11-13 NOTE — Patient Instructions (Addendum)
 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  There was concern however for the urine collection not being Refrigerated and will look into this as a potential issue with lab results.  We may need to reorder for a new collection if so. -For management of MCAS recommend you continue current high-dose antihistamine regimen: Zyrtec 3 times a day, Pepcid  3 times a day and Singulair  daily regimen for your primary antihistamine, secondary antihistamine, antileukotriene respectively. Continue Cromolyn  taken prior to meals. Cromolyn  is a mast cell stabilizer. -Continue Flonase  2 sprays each nostril daily for 1-2 weeks at a time before stopping once nasal congestion improves for maximum benefit -Have access to albuterol  inhaler for relief of symptoms of having wheezing, cough, chest tightness or shortness of breath. -Have access to an epinephrine  device in case of allergic reaction. Follow emergency action plan to follow in case of allergic reaction.  Follow-up 2 to 3 months postpartum

## 2023-12-04 ENCOUNTER — Encounter: Payer: Self-pay | Admitting: Neurology

## 2023-12-04 ENCOUNTER — Ambulatory Visit: Payer: Medicaid Other | Admitting: Neurology

## 2023-12-18 ENCOUNTER — Telehealth: Admitting: Physician Assistant

## 2023-12-18 DIAGNOSIS — J324 Chronic pansinusitis: Secondary | ICD-10-CM

## 2023-12-18 DIAGNOSIS — H6523 Chronic serous otitis media, bilateral: Secondary | ICD-10-CM | POA: Diagnosis not present

## 2023-12-18 MED ORDER — AMOXICILLIN 875 MG PO TABS: 875.0000 mg | ORAL_TABLET | Freq: Two times a day (BID) | ORAL | 0 refills | Status: AC

## 2023-12-18 NOTE — Progress Notes (Signed)

## 2023-12-21 ENCOUNTER — Other Ambulatory Visit: Payer: Self-pay | Admitting: Neurology

## 2024-01-25 ENCOUNTER — Telehealth: Admitting: Physician Assistant

## 2024-01-25 DIAGNOSIS — J329 Chronic sinusitis, unspecified: Secondary | ICD-10-CM

## 2024-01-25 MED ORDER — CEFDINIR 300 MG PO CAPS: 300.0000 mg | ORAL_CAPSULE | Freq: Two times a day (BID) | ORAL | 0 refills | Status: DC

## 2024-01-25 NOTE — Progress Notes (Signed)

## 2024-02-01 NOTE — Discharge Summary (Signed)
 Obstetrics Postpartum Discharge Summary   Admit Date:  01/30/2024 Discharge Date: 02/01/2024  Primary OB: [facility])     Admission Diagnoses:  33 y.o. at [redacted]w[redacted]d pregnant admitted for Induction of Labor, Indication: POTS/Ehlers Danlos  Pregnancy / Antepartum complications:    Ehlers-Danlos syndrome (HHS-HCC)   Seizure disorder (CMS/HHS-HCC)   Asthma (HHS-HCC)   POTS (postural orthostatic tachycardia syndrome)   Fibromyalgia muscle pain   Attention deficit disorder (ADD) without hyperactivity   Depression complicating pregnancy, antepartum (HHS-HCC)   History of seizure disorder   Mast cell activation syndrome (CMS/HHS-HCC)   Migraine   Neuropathy   History of primary IgA nephropathy   Colitis, unspecified   Macrocytosis without anemia  Discharge Diagnoses: S/p Vaginal, Precipitous w/ 1st degree laceration on Jan 30, 2024 + diagnoses listed above Delivery Details   Maternal Gravity and Parity: [id number] Gestational Age at Delivery: [redacted]w[redacted]d Angela Name / Sex: Angela Boy  Woodard / female Date/Time of Birth: 01/30/2024 / 11:06 PM Birth Weight: 3.39 kg (7 lb 7.6 oz) Apgars: 8  9        Delivery type: Vaginal, Precipitous Lacerations:   Episiotomy: None  Perineal: 1st - repaired                     Placenta: Spontaneous / Intact Labor Complications:  None Anesthesia: IV PCA                  Delivering clinician Attending on call Delivery Nurse Delivery Assist Delivery Assist Ulen, Aya Small, Hadassah Quale, MD Raynor, Jorene Devonshire, Arlean Crigler, [facility] Course:   Antepartum/Intrapartum: Presented for IOL in the setting of  POTS and Ehlers Danlos. Patient had a favorable cervix. Augmentation of labor was achieved with pitocin and AROM done. Patient progressed rapidly from 6 cm to complete in 10 minutes. The patient pushed appropriately and had an uncomplicated vaginal delivery w/ a 1st degree perineal lac,  repaired using 4 interrupted silk stiches that will need to be removed in 6 days. This was done in setting of significant allergies with mast cell activation syndrome. The third stage of labor was actively managed with pitocin.   Postpartum: Angela Woodard was transitioned to Postpartum service. She recovered well postpartum with hospital course outlined below. On day of discharge, she had met all postpartum goals.   Neonatal Course: unremarkable  Hospital Course by Problem: Laceration repair SILK SUTURE USED FOR LACERATION due to allergy history, used 4 interrupted silk stiches. Will need removal in 6 days in clinic.     IBD - Admitted 5/4 - 5/6 at [facility] with severe abdominal pain and 3 days of hematochezia. MRI with descending colon wall thickening, thought to be ischemic colitis.  - Currently being managed by [facility] and followed by [facility]. Started on Uceris 9 mg daily 5/12 (PO budesonide). 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 - Bradycardic w/ dizziness and syncope OVN on  7/28             - ECG and BNP wnl             - Benadryl  provided relief - Normal LVEF 06/17/23.  - Followed by by [facility].  - 08/17/23: The patient was advised to increase propranolol  to 15 mg TID. If after 2-4 weeks, she remains symptomatic and BP is stable then she may further increase propranolol  to 20 mg TID.  - 11/20/2023 patient endorsing remains on propanolol. - 3T US  EFW 2076g/46%ile with low normal AFI as above - Maternal  echocardiogram - normal on 06/2023  - Planning to see [facility]ology post delivery - Per her cardiologist will increase midodrine  5mg  daily and start salt tabs   Mast cell activation syndrome The patient has been seen by Allergy in 01/2023 and had a work-up that was unremarkable. Her symptoms include being itchy and having allergic reactions to random things.  - She has an epinephrine  pen ready. No additional considerations to discuss from an obstetrical perspective. - Per patient taking naltrexone , zyrtec  TID, pepcid , singulair , and cromolyn .    H/o IgA Nephropathy The patient reports that she was diagnosed with IgA nephropathy at age 46 by a nephrologist in the setting of joint pain/chronic pain. She does not recall ever undergoing a renal biopsy or receiving any formal treatment. She has not been seen by a nephrologist in 9+ years. She has a normal baseline UPC.   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  - Previously on Keppra  --> Then changed to Gabapentin .  - Currently well controlled on gabapentin .  - Co-managed by Neurology   Asthma - Only using medications when having a mast cell flare    ADHD - Taking adderall as confirmed 11/20/2023  - Previously counseled on limiting stimulant use during pregnancy. Was taking a reduced dosage from prior to pregnancy - Ordered for home adderall    Depression Depression during previous pregnancies with concerns about recurrence. Previous negative experiences with SSRIs and SNRIs. Mood affected by physical illness and OCD-related stress. - Monitor mood symptoms closely. Patient declining additional needs 11/20/2023.              - Message  sent to CSW to ideally see at next visit. Pt seen on 12/10/2023.  - Consider postpartum depression treatment options if symptoms worsen - Consider starting SSRI (Ideally Sertraline or Lexapro) if symptoms worsen - Consider Zuranolone postpartum if depressive symptoms - PP CSW consult prior to discharge on 7/28  - Pt reports mood stability during this pregnancy - Pt is not interested in trying medications again but would return to therapy if needed in the postpartum period    Health Maintenance / Postpartum:   ABO/RH: O Positive - Rhogam not indicated  VTE Risk: Low - Recommend mechanical prophylaxis until ambulation, no further VTE prophylaxis recommended., Rx Lovenox: Not indicated  Immunizations:   Jump to review immunizations <redacted file path>  Action  Rubella Positive  (02/06 1432): immune  TdaP: 11/20/2023 up to date  Influenza: 05/24/2006 defer to future visit   Feeding method: Breastfeeding: Yes, Pumping: No Contraception plan at discharge: Condom  Pap:  Diagnostic Interpretation  Date Value Ref Range Status  08/13/2023 EPITHELIAL CELL ABNORMALITY (!)   Final   Findings  Date Value Ref Range Status  08/13/2023   Final   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    Discharge:  No opioid prescriptions indicated.   Disposition: Home  Follow-up appointment: Suture removal in 6 days and Routine postpartum check in 4-6 weeks with [facility]  The delivery summary has been signed and the discharge summary has been refreshed before signing.   Angela RENE LUNN, MD  02/01/2024 ----------------------------------------------------- Future Appointments  Date Time Provider Department Center  03/02/2024  3:00 PM Inga Jon Dragon, MD [facility]     Medication List     START taking these medications    acetaminophen  325 MG tablet Commonly known as: TYLENOL  Take 2 tablets (650 mg total) by mouth every 4 (four)  hours as needed for up to 10 days   docusate 100 MG capsule Commonly known as: COLACE Take 1 capsule (100 mg total) by mouth 2 (two) times daily for 10 days   ibuprofen 600 MG tablet Commonly known as: MOTRIN Take 1 tablet (600 mg total) by mouth every 6 (six) hours as needed for up to 10 days   polyethylene glycol packet Commonly known as: MIRALAX Take 1 packet (17 g total) by mouth once daily for 30 days Mix in 4-8ounces of fluid prior to taking.       CONTINUE taking these medications    albuterol  MDI (PROVENTIL , VENTOLIN , PROAIR ) HFA 90 mcg/actuation inhaler   aspirin 81 MG EC tablet   budesonide 3 mg EC capsule Commonly known as: ENTOCORT EC   cromolyn  100 mg/5 mL solution Commonly known as: GASTROCROM    dextroamphetamine -amphetamine  20 MG XR capsule Commonly known as: ADDERALL XR   EPINEPHrine  0.3 mg/0.3 mL auto-injector Commonly known as: EPIPEN    famotidine  20 MG tablet Commonly known as: PEPCID    fluticasone  propionate 50 mcg/actuation nasal spray Commonly known as: FLONASE    folic acid 1 MG tablet Commonly known as: FOLVITE   gabapentin  400 MG capsule Commonly known as: NEURONTIN    magnesium  oxide 400 mg (241.3 mg magnesium ) tablet Commonly known as: MAG-OX Take 1 tablet (400 mg total) by mouth once daily   mesalamine 1.2 gram EC tablet Commonly known as: LIALDA   MIDODRINE  ORAL   montelukast  10 mg tablet Commonly known as: SINGULAIR  Take 1 tablet (10 mg total) by mouth at bedtime   NALTREXONE  ORAL   PNV 12-iron-methylfolate-dha 29 mg iron-1 mg -350 mg Ckdr   propranoloL  60 MG LA capsule Commonly known as: INDERAL  LA   triamcinolone 0.5 % cream Apply topically 2 (two) times daily   ZyrTEC  10 mg capsule Generic drug: cetirizine        ASK your doctor about these medications    ascorbic acid (vitamin C) 500 MG tablet Commonly known as: VITAMIN C   cyclobenzaprine  5 MG tablet Commonly known as: FLEXERIL    levonorgestrel-ethinyl  estradiol 0.1-20 mg-mcg tablet Commonly known as: LESSINA   lidocaine-diphenhydramine -aluminum-magnesium -simethicone oral suspension Commonly known as: FIRST MOUTHWASH BLM Swish and spit 5 mLs every 6 (six) hours as needed   PROBIOTIC 3 billion cell Cap Generic drug: lactobacillus combination no.4         Where to Get Your Medications     These medications were sent to [id number] - [place], [place] - [address] AT [facility] OF [address]  [address], [place] [address]    Hours: 24-hours Phone: [phone]  acetaminophen  325 MG tablet docusate 100 MG capsule ibuprofen 600 MG tablet polyethylene glycol packet    Attestation Statement:   This service was rendered under my overall direction and control, and I was immediately available via phone/pager or present on site.  JILL MELISSA HAGEY, MD

## 2024-02-04 ENCOUNTER — Inpatient Hospital Stay (HOSPITAL_COMMUNITY)
Admission: AD | Admit: 2024-02-04 | Discharge: 2024-02-04 | Disposition: A | Discharge status: Home / Self Care | Attending: Obstetrics & Gynecology | Admitting: Obstetrics & Gynecology

## 2024-02-04 ENCOUNTER — Encounter (HOSPITAL_COMMUNITY): Payer: Self-pay | Admitting: Obstetrics & Gynecology

## 2024-02-04 DIAGNOSIS — M7989 Other specified soft tissue disorders: Secondary | ICD-10-CM | POA: Diagnosis not present

## 2024-02-04 DIAGNOSIS — Z79899 Other long term (current) drug therapy: Secondary | ICD-10-CM | POA: Insufficient documentation

## 2024-02-04 DIAGNOSIS — R10811 Right upper quadrant abdominal tenderness: Secondary | ICD-10-CM | POA: Diagnosis not present

## 2024-02-04 DIAGNOSIS — R519 Headache, unspecified: Secondary | ICD-10-CM | POA: Diagnosis not present

## 2024-02-04 DIAGNOSIS — R079 Chest pain, unspecified: Secondary | ICD-10-CM | POA: Insufficient documentation

## 2024-02-04 DIAGNOSIS — I498 Other specified cardiac arrhythmias: Secondary | ICD-10-CM | POA: Diagnosis not present

## 2024-02-04 DIAGNOSIS — I10 Essential (primary) hypertension: Secondary | ICD-10-CM | POA: Insufficient documentation

## 2024-02-04 DIAGNOSIS — R001 Bradycardia, unspecified: Secondary | ICD-10-CM | POA: Insufficient documentation

## 2024-02-04 DIAGNOSIS — O9089 Other complications of the puerperium, not elsewhere classified: Secondary | ICD-10-CM | POA: Diagnosis not present

## 2024-02-04 DIAGNOSIS — Z87891 Personal history of nicotine dependence: Secondary | ICD-10-CM | POA: Diagnosis not present

## 2024-02-04 DIAGNOSIS — R609 Edema, unspecified: Secondary | ICD-10-CM | POA: Insufficient documentation

## 2024-02-04 HISTORY — DX: Anemia, unspecified: D64.9

## 2024-02-04 HISTORY — DX: Unspecified fracture of lower end of left humerus, initial encounter for closed fracture: S42.402A

## 2024-02-04 HISTORY — DX: Unspecified ovarian cyst, unspecified side: N83.209

## 2024-02-04 HISTORY — DX: Unspecified abnormal cytological findings in specimens from vagina: R87.629

## 2024-02-04 LAB — CBC
HCT: 37.7 % (ref 36.0–46.0)
Hemoglobin: 12.5 g/dL (ref 12.0–15.0)
MCH: 32.7 pg (ref 26.0–34.0)
MCHC: 33.2 g/dL (ref 30.0–36.0)
MCV: 98.7 fL (ref 80.0–100.0)
Platelets: 184 K/uL (ref 150–400)
RBC: 3.82 MIL/uL — ABNORMAL LOW (ref 3.87–5.11)
RDW: 12.5 % (ref 11.5–15.5)
WBC: 9.7 K/uL (ref 4.0–10.5)
nRBC: 0 % (ref 0.0–0.2)

## 2024-02-04 LAB — COMPREHENSIVE METABOLIC PANEL WITH GFR
ALT: 33 U/L (ref 0–44)
AST: 35 U/L (ref 15–41)
Albumin: 3 g/dL — ABNORMAL LOW (ref 3.5–5.0)
Alkaline Phosphatase: 91 U/L (ref 38–126)
Anion gap: 8 (ref 5–15)
BUN: 8 mg/dL (ref 6–20)
CO2: 21 mmol/L — ABNORMAL LOW (ref 22–32)
Calcium: 8.8 mg/dL — ABNORMAL LOW (ref 8.9–10.3)
Chloride: 108 mmol/L (ref 98–111)
Creatinine, Ser: 0.7 mg/dL (ref 0.44–1.00)
GFR, Estimated: >60 mL/min (ref >60)
Glucose, Bld: 110 mg/dL — ABNORMAL HIGH (ref 70–99)
Potassium: 4.1 mmol/L (ref 3.5–5.1)
Sodium: 137 mmol/L (ref 135–145)
Total Bilirubin: 0.4 mg/dL (ref 0.0–1.2)
Total Protein: 6.2 g/dL — ABNORMAL LOW (ref 6.5–8.1)

## 2024-02-04 MED ORDER — DIPHENHYDRAMINE HCL 25 MG PO CAPS
25.0000 mg | ORAL_CAPSULE | Freq: Once | ORAL | Status: AC
  Administered 2024-02-04: 25 mg via ORAL
  Filled 2024-02-04: qty 1

## 2024-02-04 MED ORDER — BUTALBITAL-APAP-CAFFEINE 50-325-40 MG PO TABS: 1.0000 | ORAL_TABLET | Freq: Four times a day (QID) | ORAL | 0 refills | Status: AC | PRN

## 2024-02-04 MED ORDER — METOCLOPRAMIDE HCL 10 MG PO TABS
10.0000 mg | ORAL_TABLET | Freq: Once | ORAL | Status: AC
  Administered 2024-02-04: 10 mg via ORAL
  Filled 2024-02-04: qty 1

## 2024-02-04 NOTE — Discharge Instructions (Signed)
 It was a pleasure taking care of you today.  I am sorry you are still having this headache.  I sent a prescription to your pharmacy with Fioricet.  Regarding the concern for preeclampsia your blood pressures have been completely normal since you got here and all of your lab work came back normal.  If your symptoms worsen or you have any other concerns please return for further evaluation.  I hope you have a great rest of your night and get to feeling better!

## 2024-02-04 NOTE — MAU Note (Signed)
 Angela Woodard is a 33 y.o. at 106w5d here in MAU reporting: vag del on 7/26 at [facility].  Got an infection, was kept an extra day.  Has chronic low BP.  Pulse has also been super low (30's-40's).  Has POTS, so fluctuates. Has noted a HA for the past 2 days, this is different from any migraine she has had. Also having swelling in feet and hands. Checked BP 142/98 146/90, had gone down a little before she came in.  Feels really wrong.  Increase in floaters and bright light flashes. .  Has also been really nauseous today.  Has had some SOB and chest pain today.  Started having some right sided pain as we were walking to rm.  Bleeding ok. Baby is great, perfect. Onset of complaint: 2days Pain score: HA 7,  chest pain 4 dull, rt side 4 dull Vitals:   02/04/24 1633 02/04/24 1636  BP:  137/81  Pulse:  69  Resp:  17  Temp:  98.9 F (37.2 C)  SpO2: 98%       Lab orders placed from triage:

## 2024-02-04 NOTE — MAU Provider Note (Signed)
 History     CSN: [id number]  Arrival date and time: 02/04/24 1505   Event Date/Time   First Provider Initiated Contact with Patient 02/04/24 1659      Chief Complaint  Patient presents with   Headache   Hypertension   Chest Pain   Abdominal Pain   HPI Patient is a 33 year old postpartum day 5 after SVD at [facility].  Patient presents today because she reports she has been having elevated blood pressures with headaches for the past 2 days.  Also reports worsening swelling in her hands and feet.  Blood pressures this morning were 142/98 and 146/90.  Reports she just feels off.  Also reports that she has chronic history of floaters but she has noticed more since delivery.  Patient also complaining of nausea, chest pain, shortness of breath.  OB History     Gravida  4   Para  3   Term  3   Preterm      AB  1   Living  3      SAB  1   IAB      Ectopic      Multiple      Live Births  3           Past Medical History:  Diagnosis Date   Anemia    Arthritis    Asthma    Celiac disease    Depression    Ehlers-Danlos, hypermobile type    Normal echo 2021   Elbow fracture, left    age 44   Fibromyalgia    History of kidney stones    Mast cell activation syndrome (HCC)    Migraine    Nephropathy    OCD (obsessive compulsive disorder)    Ovarian cyst    POTS (postural orthostatic tachycardia syndrome)    Pruritus    Normal bile acids 06/06/2021   Seizure disorder (HCC)    Last reported event 05/15/2021- during pt sleep; epilepsy   Urticaria    Vaginal Pap smear, abnormal     Past Surgical History:  Procedure Laterality Date   ADENOIDECTOMY     TYMPANOSTOMY     x3   TYMPANOSTOMY TUBE PLACEMENT      Family History  Problem Relation Age of Onset   Allergic rhinitis Mother    Hypertension Mother    Allergic rhinitis Father    Throat cancer Father    Melanoma Father    Urticaria Sister    Allergic rhinitis Sister    Anxiety disorder Sister     Memory loss Maternal Grandmother    Colon cancer Maternal Grandmother    Heart disease Paternal Grandfather    Lung cancer Paternal Grandfather    Obesity Paternal Grandfather    Diabetes Paternal Grandfather    Seizures Neg Hx     Social History   Tobacco Use   Smoking status: Never    Passive exposure: Never   Smokeless tobacco: Never  Vaping Use   Vaping status: Former   Substances: CBD  Substance Use Topics   Alcohol use: Not Currently   Drug use: No    Allergies:  Allergies  Allergen Reactions   Relpax [Eletriptan] Anaphylaxis    Throat swelling   Sulfa Antibiotics Hives and Rash   Gluten Meal    Latex Hives   Lamotrigine Rash    Medications Prior to Admission  Medication Sig Dispense Refill Last Dose/Taking   acetaminophen  (TYLENOL ) 500 MG tablet Take 1,000 mg by  mouth every 6 (six) hours as needed.   02/04/2024 at  2:30 PM   albuterol  (VENTOLIN  HFA) 108 (90 Base) MCG/ACT inhaler Inhale 2 puffs into the lungs every 4 (four) hours as needed for wheezing or shortness of breath. 18 g 1 Past Month   amphetamine -dextroamphetamine  (ADDERALL) 20 MG tablet Take 20 mg by mouth 2 (two) times daily. 20mg  in the morning and 10mg  at night   02/04/2024 Morning   cefdinir  (OMNICEF ) 300 MG capsule Take 1 capsule (300 mg total) by mouth 2 (two) times daily. 20 capsule 0 02/04/2024 Morning   cetirizine  (ZYRTEC ) 10 MG tablet Take 1 tablet (10 mg total) by mouth in the morning, at noon, and at bedtime. 90 tablet 5 02/04/2024 Noon   cromolyn  (GASTROCROM ) 100 MG/5ML solution Take 5 mLs (100 mg total) by mouth 3 (three) times daily before meals. 480 mL 5 02/04/2024   cyclobenzaprine  (FLEXERIL ) 5 MG tablet Take 1 tablet (5 mg total) by mouth at bedtime as needed for muscle spasms. 30 tablet 0 02/03/2024 Evening   famotidine  (PEPCID ) 20 MG tablet Take 1 tablet (20 mg total) by mouth 3 (three) times daily. 90 tablet 5 02/04/2024 at  2:00 PM   fluticasone  (FLONASE ) 50 MCG/ACT nasal spray Place 2  sprays into both nostrils daily. 16 g 5 02/04/2024   gabapentin  (NEURONTIN ) 400 MG capsule TAKE 1 CAPSULE(400 MG) BY MOUTH THREE TIMES DAILY 270 capsule 0 02/04/2024 at  2:00 PM   ibuprofen (ADVIL) 200 MG tablet Take 400 mg by mouth every 6 (six) hours as needed. 200mg  x2   02/04/2024 at  2:30 PM   montelukast  (SINGULAIR ) 10 MG tablet Take 1 tablet (10 mg total) by mouth at bedtime. 90 tablet 1 02/03/2024 Bedtime   propranolol  (INDERAL ) 10 MG tablet Take 10 mg by mouth 3 (three) times daily.   02/04/2024 Morning   EPINEPHrine  (NEFFY ) 2 MG/0.1ML SOLN Place 1 spray into the nose daily as needed. 4 each 1    EPINEPHrine  0.3 mg/0.3 mL IJ SOAJ injection Inject 0.3 mg into the muscle as needed for anaphylaxis. (Patient not taking: Reported on 11/13/2023) 1 each 2    folic acid (FOLVITE) 1 MG tablet Take 1 mg by mouth daily.      magnesium  oxide (MAG-OX) 400 MG tablet Take 200 mg by mouth daily.      naltrexone  (DEPADE) 50 MG tablet Take 2 mg by mouth at bedtime.      NONFORMULARY OR COMPOUNDED ITEM Take 1-2 mg by mouth daily. Naltrexone  90 each 1    Prenatal MV-Min-Fe Cbn-FA-DHA (OBTREX DHA) 29-1 & 350 MG MISC    02/02/2024    Review of Systems  Constitutional:  Positive for fatigue.  Eyes:  Positive for visual disturbance.  Respiratory:  Positive for shortness of breath.   Cardiovascular:  Positive for chest pain.  Gastrointestinal:  Positive for abdominal pain and nausea. Negative for abdominal distention.  Genitourinary:  Positive for vaginal bleeding.  Neurological:  Positive for dizziness and headaches.   Physical Exam   Blood pressure 137/83, pulse 67, temperature 98.9 F (37.2 C), temperature source Oral, resp. rate 17, SpO2 98%, currently breastfeeding.  Physical Exam HENT:     Head: Normocephalic and atraumatic.     Mouth/Throat:     Mouth: Mucous membranes are moist.  Eyes:     Extraocular Movements: Extraocular movements intact.     Right eye: No nystagmus.     Left eye: No  nystagmus.     Pupils:  Pupils are equal, round, and reactive to light.  Pulmonary:     Effort: Pulmonary effort is normal.  Abdominal:     Tenderness: There is abdominal tenderness (Right upper quadrant tenderness).  Musculoskeletal:        General: No swelling. Normal range of motion.     Cervical back: Normal range of motion.  Skin:    General: Skin is warm and dry.     Capillary Refill: Capillary refill takes less than 2 seconds.  Neurological:     Mental Status: She is alert.     GCS: GCS eye subscore is 4. GCS verbal subscore is 5. GCS motor subscore is 6.     Cranial Nerves: No cranial nerve deficit or facial asymmetry.     Sensory: No sensory deficit.     Motor: No weakness or abnormal muscle tone.  Psychiatric:        Mood and Affect: Mood is not anxious.        Speech: Speech normal.        Behavior: Behavior is not agitated.     MAU Course  Procedures  MDM CBC CMP EKG Reglan  Benadryl   Assessment and Plan  Angela Woodard is a 33 year old presenting for evaluation after having worsening headache as well as elevated blood pressures at home.  Postpartum elevated blood pressures Headache Floaters Patient presents for elevated blood pressures at home in the 140s/90s as well as headache which has not responded to Tylenol .  Had taken Tylenol  at 2 PM.  Out of concern for postpartum preeclampsia CBC and CMP ordered.  Blood pressures remained within normal limits throughout hospital stay.  CBC within normal limits.  CMP with no gross abnormalities.  Complete neuroexam showing no abnormalities.  Physical exam showed mild right upper quadrant tenderness but otherwise within normal limits.  She did have +1 edema of the lower extremities bilaterally.  EKG showing sinus bradycardia rate of 57 beats per minute.  Gave Reglan  and Benadryl  without relief of the headache.  Offered caffeine  or Fioricet but patient reported she could take that at home.  Discussed strict return  precautions and patient discharged home.   Erby Sanderson V Jora Galluzzo 02/04/2024, 5:09 PM

## 2024-02-05 ENCOUNTER — Inpatient Hospital Stay (HOSPITAL_COMMUNITY)
Admission: AD | Admit: 2024-02-05 | Discharge: 2024-02-06 | Disposition: A | Discharge status: Home / Self Care | Payer: Self-pay | Attending: Obstetrics and Gynecology | Admitting: Obstetrics and Gynecology

## 2024-02-05 DIAGNOSIS — R079 Chest pain, unspecified: Secondary | ICD-10-CM | POA: Insufficient documentation

## 2024-02-05 DIAGNOSIS — R030 Elevated blood-pressure reading, without diagnosis of hypertension: Secondary | ICD-10-CM | POA: Diagnosis not present

## 2024-02-05 DIAGNOSIS — O9089 Other complications of the puerperium, not elsewhere classified: Secondary | ICD-10-CM | POA: Insufficient documentation

## 2024-02-05 DIAGNOSIS — R519 Headache, unspecified: Secondary | ICD-10-CM | POA: Diagnosis not present

## 2024-02-05 LAB — CBC
HCT: 38.1 % (ref 36.0–46.0)
Hemoglobin: 12.6 g/dL (ref 12.0–15.0)
MCH: 32.9 pg (ref 26.0–34.0)
MCHC: 33.1 g/dL (ref 30.0–36.0)
MCV: 99.5 fL (ref 80.0–100.0)
Platelets: 205 K/uL (ref 150–400)
RBC: 3.83 MIL/uL — ABNORMAL LOW (ref 3.87–5.11)
RDW: 12.5 % (ref 11.5–15.5)
WBC: 10.2 K/uL (ref 4.0–10.5)
nRBC: 0 % (ref 0.0–0.2)

## 2024-02-05 LAB — COMPREHENSIVE METABOLIC PANEL WITH GFR
ALT: 32 U/L (ref 0–44)
AST: 31 U/L (ref 15–41)
Albumin: 3.2 g/dL — ABNORMAL LOW (ref 3.5–5.0)
Alkaline Phosphatase: 91 U/L (ref 38–126)
Anion gap: 9 (ref 5–15)
BUN: 10 mg/dL (ref 6–20)
CO2: 24 mmol/L (ref 22–32)
Calcium: 8.9 mg/dL (ref 8.9–10.3)
Chloride: 107 mmol/L (ref 98–111)
Creatinine, Ser: 0.69 mg/dL (ref 0.44–1.00)
GFR, Estimated: >60 mL/min (ref >60)
Glucose, Bld: 92 mg/dL (ref 70–99)
Potassium: 5 mmol/L (ref 3.5–5.1)
Sodium: 140 mmol/L (ref 135–145)
Total Bilirubin: 0.4 mg/dL (ref 0.0–1.2)
Total Protein: 6.5 g/dL (ref 6.5–8.1)

## 2024-02-05 NOTE — MAU Note (Addendum)
 Pt says she del vag on Sat 7-26th - then was D/C on  Mon 7-28- all ok- was having some bradycardia.   Had an infection- gave antx. Then came here yesterday  for H/A and high BP - drew labs, gave meds .  Then was D/C . Then she called OB Dr at 10am- told pt to come back to hospital. Has been checking BP at home - last 150/100 at 930pm.  .  H/A - has taken 0800 ( H/A was a 9 ) - 2 Advil and 2 XS   and Neurotonin - went down to an 8. Then all again 2pm-H/A was a 9 again  Now - pain - H/A is a 9.  Also feels pain under right rib- started yesterday - dull- but today worse. Has pain in her chest  (also has POTTS )  Feels like something sitting on her chest.  Has blurry vision- started today . Yesterday floaters

## 2024-02-05 NOTE — MAU Provider Note (Signed)
 History     CSN: [id number]  Arrival date and time: 02/05/24 2138   Event Date/Time   First Provider Initiated Contact with Patient 02/05/24 2206      Chief Complaint  Patient presents with   Headache   Hypertension   Angela Woodard is a 33 y.o. at 6 days postpartum from uncomplicated VD and pregnancy.  She who receives care at [facility]  She presents today for elevated blood pressure and chest pain.  She reports that she was seen yesterday for same complaints.  She states she has been checking her blood pressures and they have been elevated (150s/100s).  She reports some chest pain that she describes as heavy sensation. She reports bleeding is as expected. She reports blurry vision that she states started today, but has been experiencing floaters. Patient denies history of PreE in previous pregnancies or postpartum periods.   OB History     Gravida  4   Para  3   Term  3   Preterm      AB  1   Living  3      SAB  1   IAB      Ectopic      Multiple      Live Births  3           Past Medical History:  Diagnosis Date   Anemia    Arthritis    Asthma    Celiac disease    Depression    Ehlers-Danlos, hypermobile type    Normal echo 2021   Elbow fracture, left    age 20   Fibromyalgia    History of kidney stones    Mast cell activation syndrome (HCC)    Migraine    Nephropathy    OCD (obsessive compulsive disorder)    Ovarian cyst    POTS (postural orthostatic tachycardia syndrome)    Pruritus    Normal bile acids 06/06/2021   Seizure disorder (HCC)    Last reported event 05/15/2021- during pt sleep; epilepsy   Urticaria    Vaginal Pap smear, abnormal     Past Surgical History:  Procedure Laterality Date   ADENOIDECTOMY     TYMPANOSTOMY     x3   TYMPANOSTOMY TUBE PLACEMENT      Family History  Problem Relation Age of Onset   Allergic rhinitis Mother    Hypertension Mother    Allergic rhinitis Father    Throat cancer Father    Melanoma  Father    Urticaria Sister    Allergic rhinitis Sister    Anxiety disorder Sister    Memory loss Maternal Grandmother    Colon cancer Maternal Grandmother    Heart disease Paternal Grandfather    Lung cancer Paternal Grandfather    Obesity Paternal Grandfather    Diabetes Paternal Grandfather    Seizures Neg Hx     Social History   Tobacco Use   Smoking status: Never    Passive exposure: Never   Smokeless tobacco: Never  Vaping Use   Vaping status: Former   Substances: CBD  Substance Use Topics   Alcohol use: Not Currently   Drug use: No    Allergies:  Allergies  Allergen Reactions   Relpax [Eletriptan] Anaphylaxis    Throat swelling   Sulfa Antibiotics Hives and Rash   Gluten Meal    Latex Hives   Lamotrigine Rash    Medications Prior to Admission  Medication Sig Dispense Refill Last Dose/Taking  acetaminophen  (TYLENOL ) 500 MG tablet Take 1,000 mg by mouth every 6 (six) hours as needed.   02/05/2024   albuterol  (VENTOLIN  HFA) 108 (90 Base) MCG/ACT inhaler Inhale 2 puffs into the lungs every 4 (four) hours as needed for wheezing or shortness of breath. 18 g 1 Past Month   amphetamine -dextroamphetamine  (ADDERALL) 20 MG tablet Take 20 mg by mouth 2 (two) times daily. 20mg  in the morning and 10mg  at night   02/05/2024   cefdinir  (OMNICEF ) 300 MG capsule Take 1 capsule (300 mg total) by mouth 2 (two) times daily. 20 capsule 0 02/04/2024   cetirizine  (ZYRTEC ) 10 MG tablet Take 1 tablet (10 mg total) by mouth in the morning, at noon, and at bedtime. 90 tablet 5 02/05/2024 at  2:00 PM   cromolyn  (GASTROCROM ) 100 MG/5ML solution Take 5 mLs (100 mg total) by mouth 3 (three) times daily before meals. 480 mL 5 02/05/2024   cyclobenzaprine  (FLEXERIL ) 5 MG tablet Take 1 tablet (5 mg total) by mouth at bedtime as needed for muscle spasms. 30 tablet 0 02/04/2024   famotidine  (PEPCID ) 20 MG tablet Take 1 tablet (20 mg total) by mouth 3 (three) times daily. 90 tablet 5 02/05/2024   fluticasone   (FLONASE ) 50 MCG/ACT nasal spray Place 2 sprays into both nostrils daily. 16 g 5 02/04/2024   gabapentin  (NEURONTIN ) 400 MG capsule TAKE 1 CAPSULE(400 MG) BY MOUTH THREE TIMES DAILY 270 capsule 0 02/05/2024 at  2:00 PM   ibuprofen (ADVIL) 200 MG tablet Take 400 mg by mouth every 6 (six) hours as needed. 200mg  x2   02/05/2024   montelukast  (SINGULAIR ) 10 MG tablet Take 1 tablet (10 mg total) by mouth at bedtime. 90 tablet 1 02/04/2024   Prenatal MV-Min-Fe Cbn-FA-DHA (OBTREX DHA) 29-1 & 350 MG MISC    02/04/2024   propranolol  (INDERAL ) 10 MG tablet Take 10 mg by mouth 3 (three) times daily.   02/05/2024   butalbital -acetaminophen -caffeine  (FIORICET) 50-325-40 MG tablet Take 1-2 tablets by mouth every 6 (six) hours as needed for headache. 20 tablet 0    EPINEPHrine  0.3 mg/0.3 mL IJ SOAJ injection Inject 0.3 mg into the muscle as needed for anaphylaxis. (Patient not taking: Reported on 11/13/2023) 1 each 2     Review of Systems  Cardiovascular:  Positive for chest pain.  Gastrointestinal:  Negative for abdominal pain, nausea and vomiting.  Genitourinary:  Negative for difficulty urinating and dysuria.   Physical Exam   Blood pressure 135/84, temperature 98.9 F (37.2 C), temperature source Oral, resp. rate 14, height 5' 4 (1.626 m), weight 79.2 kg, SpO2 99%, currently breastfeeding. Vitals:   02/05/24 2203 02/05/24 2242  BP: 135/84 121/79  Pulse:  68  Resp: 14   Temp: 98.9 F (37.2 C)   TempSrc: Oral   SpO2: 99%   Weight: 79.2 kg   Height: 5' 4 (1.626 m)     Physical Exam Vitals and nursing note reviewed. Exam conducted with a chaperone present Rae, RN).  Constitutional:      Appearance: Normal appearance. She is well-developed.  HENT:     Head: Normocephalic and atraumatic.  Eyes:     Conjunctiva/sclera: Conjunctivae normal.  Cardiovascular:     Rate and Rhythm: Normal rate.  Pulmonary:     Effort: Pulmonary effort is normal. No respiratory distress.  Musculoskeletal:         General: Normal range of motion.     Cervical back: Normal range of motion.  Skin:    General: Skin  is warm and dry.  Neurological:     Mental Status: She is alert and oriented to person, place, and time.  Psychiatric:        Mood and Affect: Mood normal.        Behavior: Behavior normal.     MAU Course  Procedures Results for orders placed or performed during the hospital encounter of 02/05/24 (from the past 24 hours)  CBC     Status: Abnormal   Collection Time: 02/05/24 10:36 PM  Result Value Ref Range   WBC 10.2 4.0 - 10.5 K/uL   RBC 3.83 (L) 3.87 - 5.11 MIL/uL   Hemoglobin 12.6 12.0 - 15.0 g/dL   HCT 61.8 63.9 - 53.9 %   MCV 99.5 80.0 - 100.0 fL   MCH 32.9 26.0 - 34.0 pg   MCHC 33.1 30.0 - 36.0 g/dL   RDW 87.4 88.4 - 84.4 %   Platelets 205 150 - 400 K/uL   nRBC 0.0 0.0 - 0.2 %  Comprehensive metabolic panel     Status: Abnormal   Collection Time: 02/05/24 10:36 PM  Result Value Ref Range   Sodium 140 135 - 145 mmol/L   Potassium 5.0 3.5 - 5.1 mmol/L   Chloride 107 98 - 111 mmol/L   CO2 24 22 - 32 mmol/L   Glucose, Bld 92 70 - 99 mg/dL   BUN 10 6 - 20 mg/dL   Creatinine, Ser 9.30 0.44 - 1.00 mg/dL   Calcium  8.9 8.9 - 10.3 mg/dL   Total Protein 6.5 6.5 - 8.1 g/dL   Albumin 3.2 (L) 3.5 - 5.0 g/dL   AST 31 15 - 41 U/L   ALT 32 0 - 44 U/L   Alkaline Phosphatase 91 38 - 126 U/L   Total Bilirubin 0.4 0.0 - 1.2 mg/dL   GFR, Estimated >39 >39 mL/min   Anion gap 9 5 - 15    MDM Physical Exam Labs: CBC, CMP Measure BP EKG Assessment and Plan  33 year old 6 Days Postpartum Headache PP Elevated BPs Visual Disturbances  -Reviewed POC with patient. -Exam performed.  -Patient initially declining interventions after bps return normal. -Informed that despite bps would recommend labs and EKG when considering complaints.  -Patient expresses frustrations and questions addressed. -Informed that provider would consider medications, but not without labs. -After  consideration, patient agreeable to labs and EKG. -Patient declines treatment for HA.   Harlene LITTIE Duncans 02/05/2024, 10:09 PM   Reassessment (11:56 PM) -Cardiology paged and Dr. Donnel returns call.  States nonspecific changes in comparison from previous EKG.  No concerns and may consider repeat in near future.  -Labs return and are normal. -BP remains normotensive. -Patient updated on findings.  -Patient encouraged to make an appt with her primary ob for blood pressure check.  -Precautions reviewed. -Encouraged to call primary office or return to MAU if symptoms worsen or with the onset of new symptoms. -Discharged to home in stable condition.  Harlene LITTIE Duncans MSN, CNM Advanced Practice Provider, [facility] [employer]

## 2024-02-20 ENCOUNTER — Telehealth: Admitting: Family Medicine

## 2024-02-20 DIAGNOSIS — J019 Acute sinusitis, unspecified: Secondary | ICD-10-CM

## 2024-02-20 NOTE — Progress Notes (Signed)
   Thank you for the details you included in the comment boxes. Those details are very helpful in determining the best course of treatment for you and help us  to provide the best care.Because of the current symptoms, we recommend that you schedule a Virtual Urgent Care video visit in order for the provider to better assess what is going on.  The provider will be able to give you a more accurate diagnosis and treatment plan if we can more freely discuss your symptoms and with the addition of a virtual examination.   If you change your visit to a video visit, we will bill your insurance (similar to an office visit) and you will not be charged for this e-Visit. You will be able to stay at home and speak with the first available [facility] advanced practice provider. The link to do a video visit is in the drop down Menu tab of your Welcome screen in MyChart.   I have spent 5 minutes in review of e-visit questionnaire, review and updating patient chart, medical decision making and response to patient.   Roosvelt Mater, PA-C

## 2024-02-29 ENCOUNTER — Telehealth: Admitting: Physician Assistant

## 2024-02-29 DIAGNOSIS — O1495 Unspecified pre-eclampsia, complicating the puerperium: Secondary | ICD-10-CM | POA: Diagnosis not present

## 2024-02-29 DIAGNOSIS — J019 Acute sinusitis, unspecified: Secondary | ICD-10-CM | POA: Diagnosis not present

## 2024-02-29 DIAGNOSIS — B9689 Other specified bacterial agents as the cause of diseases classified elsewhere: Secondary | ICD-10-CM

## 2024-02-29 DIAGNOSIS — O165 Unspecified maternal hypertension, complicating the puerperium: Secondary | ICD-10-CM | POA: Diagnosis not present

## 2024-02-29 MED ORDER — ENALAPRIL MALEATE 5 MG PO TABS: 5.0000 mg | ORAL_TABLET | Freq: Every day | ORAL | 0 refills | Status: AC

## 2024-02-29 MED ORDER — AMOXICILLIN 875 MG PO TABS: 875.0000 mg | ORAL_TABLET | Freq: Two times a day (BID) | ORAL | 0 refills | Status: AC

## 2024-02-29 NOTE — Patient Instructions (Signed)
 Wilda E Riedesel, thank you for joining [name], PA-C for today's virtual visit.  While this provider is not your primary care provider (PCP), if your PCP is located in our provider database this encounter information will be shared with them immediately following your visit.   A [facility] MyChart account gives you access to today's visit and all your visits, tests, and labs performed at [facility]  click here if you don't have a [facility] MyChart account or go to [email or web address]  Consent: (Patient) Angela Woodard provided verbal consent for this virtual visit at the beginning of the encounter.  Current Medications:  Current Outpatient Medications:    amoxicillin  (AMOXIL ) 875 MG tablet, Take 1 tablet (875 mg total) by mouth 2 (two) times daily for 10 days., Disp: 20 tablet, Rfl: 0   enalapril  (VASOTEC ) 5 MG tablet, Take 1 tablet (5 mg total) by mouth daily., Disp: 30 tablet, Rfl: 0   acetaminophen  (TYLENOL ) 500 MG tablet, Take 1,000 mg by mouth every 6 (six) hours as needed., Disp: , Rfl:    albuterol  (VENTOLIN  HFA) 108 (90 Base) MCG/ACT inhaler, Inhale 2 puffs into the lungs every 4 (four) hours as needed for wheezing or shortness of breath., Disp: 18 g, Rfl: 1   amphetamine -dextroamphetamine  (ADDERALL) 20 MG tablet, Take 20 mg by mouth 2 (two) times daily. 20mg  in the morning and 10mg  at night, Disp: , Rfl:    butalbital -acetaminophen -caffeine  (FIORICET) 50-325-40 MG tablet, Take 1-2 tablets by mouth every 6 (six) hours as needed for headache., Disp: 20 tablet, Rfl: 0   cetirizine  (ZYRTEC ) 10 MG tablet, Take 1 tablet (10 mg total) by mouth in the morning, at noon, and at bedtime., Disp: 90 tablet, Rfl: 5   cromolyn  (GASTROCROM ) 100 MG/5ML solution, Take 5 mLs (100 mg total) by mouth 3 (three) times daily before meals., Disp: 480 mL, Rfl: 5   cyclobenzaprine  (FLEXERIL ) 5 MG tablet, Take 1 tablet (5 mg total) by mouth at bedtime as needed for muscle  spasms., Disp: 30 tablet, Rfl: 0   EPINEPHrine  0.3 mg/0.3 mL IJ SOAJ injection, Inject 0.3 mg into the muscle as needed for anaphylaxis. (Patient not taking: Reported on 11/13/2023), Disp: 1 each, Rfl: 2   famotidine  (PEPCID ) 20 MG tablet, Take 1 tablet (20 mg total) by mouth 3 (three) times daily., Disp: 90 tablet, Rfl: 5   fluticasone  (FLONASE ) 50 MCG/ACT nasal spray, Place 2 sprays into both nostrils daily., Disp: 16 g, Rfl: 5   gabapentin  (NEURONTIN ) 400 MG capsule, TAKE 1 CAPSULE(400 MG) BY MOUTH THREE TIMES DAILY, Disp: 270 capsule, Rfl: 0   ibuprofen (ADVIL) 200 MG tablet, Take 400 mg by mouth every 6 (six) hours as needed. 200mg  x2, Disp: , Rfl:    montelukast  (SINGULAIR ) 10 MG tablet, Take 1 tablet (10 mg total) by mouth at bedtime., Disp: 90 tablet, Rfl: 1   Prenatal MV-Min-Fe Cbn-FA-DHA (OBTREX DHA) 29-1 & 350 MG MISC, , Disp: , Rfl:    propranolol  (INDERAL ) 10 MG tablet, Take 20 mg by mouth 3 (three) times daily., Disp: , Rfl:    Medications ordered in this encounter:  Meds ordered this encounter  Medications   enalapril  (VASOTEC ) 5 MG tablet    Sig: Take 1 tablet (5 mg total) by mouth daily.    Dispense:  30 tablet    Refill:  0    Supervising Provider:   BLAISE ALEENE KIDD [[id number]]   amoxicillin  (AMOXIL ) 875 MG tablet    Sig: Take  1 tablet (875 mg total) by mouth 2 (two) times daily for 10 days.    Dispense:  20 tablet    Refill:  0    Supervising Provider:   LAMPTEY, PHILIP O [[id number]]     *If you need refills on other medications prior to your next appointment, please contact your pharmacy*  Follow-Up: Call back or seek an in-person evaluation if the symptoms worsen or if the condition fails to improve as anticipated.  [facility] [phone]  Other Instructions High Blood Pressure After Pregnancy: What to Know High blood pressure after pregnancy, or postpartum hypertension, is blood pressure that is higher than normal after childbirth. It's most common  within 1 to 2 days after delivery, but it can happen later. Sometimes, it can happen up to 12 weeks or more after pregnancy. Some people have to have medical treatment to control high blood pressure and prevent serious complications. What are the causes? The cause of this condition is not well understood. In some cases, the cause may not be known. Certain conditions may increase your risk. These include: Hypertension that existed before pregnancy (chronic hypertension). Hypertension that happens as a result of pregnancy (gestational hypertension). Hypertensive disorders during pregnancy or seizures in women who have high blood pressure during pregnancy. These conditions are called pre-eclampsia and eclampsia. A condition in which the liver, platelets, and red blood cells are damaged during pregnancy (HELLP syndrome). Obesity. Diabetes. What increases the risk? If you had blood pressure problems during pregnancy, you're more likely to get this condition after giving birth. However, you can still have high blood pressure after delivery even if you didn't have problems during pregnancy. What are the signs or symptoms? Signs and symptoms may include: Headaches. These may be mild, moderate, or severe. They may also be steady, constant, or sudden (thunderclap headache). Vision changes, such as blurry vision, flashing lights, or seeing spots. Nausea and vomiting. Pain in the upper right side of your abdomen. Shortness of breath or trouble breathing. Swelling in your face or hands. A decrease in the amount of urine that you pass. You may not have any signs or symptoms. How is this diagnosed? This condition may be diagnosed based on the results of a physical exam, blood pressure measurements, and blood and pee (urine) tests. You may also have other tests, such as a CT scan or an MRI, to check for other problems. How is this treated? If blood pressure is high enough to require treatment, your options  may include: Medicines to reduce blood pressure (antihypertensives). Tell your health care provider if you are breastfeeding or if you plan to breastfeed. There are many antihypertensive medicines that are safe to take while breastfeeding. Treating medical conditions that are causing hypertension. Treating the complications of hypertension, such as seizures, stroke, or kidney problems. Your health care provider will also continue to closely watch your blood pressure. Follow these instructions at home: Learn your goal blood pressure Two numbers make up your blood pressure. The first number is called systolic pressure. The second is called diastolic pressure. An example of a blood pressure reading is 120 over 80 (or 120/80). Ask your health care provider what your goal blood pressure is. Know how to take your blood pressure To check your blood pressure, follow the instructions in the manual that came with your blood pressure monitor. This includes any instructions on what to do before taking your blood pressure. Record your blood pressure readings Follow your health care provider's  instructions on how to record your blood pressure readings. Your health care provider may ask you to: Get one reading in the morning (a.m.) before you take any medicines. Get one reading in the evening (p.m.) before supper. Take at least 2 readings with each blood pressure check. This makes sure the results are correct. Wait 1-2 minutes between measurements.  General instructions Take over-the-counter and prescription medicines only as told by your health care provider. Do not use any products that contain nicotine or tobacco. These products include cigarettes, chewing tobacco, and vaping devices, such as e-cigarettes. If you need help quitting, ask your health care provider. Check your blood pressure as often as told by your health care provider. Return to your normal activities as told by your health care provider.  Ask your health care provider what activities are safe for you. Keep all follow-up visits. Your health care provider will continue to closely watch your blood pressure. Contact a health care provider if: You have new symptoms, such as: A headache that does not get better. Dizziness. Vision changes. Nausea and vomiting. Get help right away if: You have trouble breathing. You have chest pain. You faint. You have any symptoms of a stroke. BE FAST is an easy way to remember the main warning signs of a stroke: B - Balance. Signs are dizziness, sudden trouble walking, or loss of balance. E - Eyes. Signs are trouble seeing or a sudden change in vision. F - Face. Signs are sudden weakness or numbness of the face, or the face or eyelid drooping on one side. A - Arms. Signs are weakness or numbness in an arm. This happens suddenly and usually on one side of the body. S - Speech. Signs are sudden trouble speaking, slurred speech, or trouble understanding what people say. T - Time. Time to call emergency services. Write down what time symptoms started. You have other signs of a stroke, such as: A sudden, severe headache with no known cause. Nausea or vomiting. Seizure. These symptoms may be an emergency. Get help right away. Call 911. Do not wait to see if the symptoms will go away. Do not drive yourself to the hospital. This information is not intended to replace advice given to you by your health care provider. Make sure you discuss any questions you have with your health care provider. Document Revised: 02/04/2023 Document Reviewed: 09/24/2021 Elsevier Patient Education  2024 Elsevier Inc.   If you have been instructed to have an in-person evaluation today at a local Urgent Care facility, please use the link below. It will take you to a list of all of our available [facility], including address, phone number and hours of operation. Please do not delay care.  [facility]  If you or a family member do not have a primary care provider, use the link below to schedule a visit and establish care. When you choose a [facility] primary care physician or advanced practice provider, you gain a long-term partner in health. Find a Primary Care Provider  Learn more about [facility] in-office and virtual care options: [facility] - Get Care Now

## 2024-02-29 NOTE — Progress Notes (Addendum)
 Virtual Visit Consent   Angela Woodard, you are scheduled for a virtual visit with a [facility] provider today. Just as with appointments in the office, your consent must be obtained to participate. Your consent will be active for this visit and any virtual visit you may have with one of our providers in the next 365 days. If you have a MyChart account, a copy of this consent can be sent to you electronically.  As this is a virtual visit, video technology does not allow for your provider to perform a traditional examination. This may limit your provider's ability to fully assess your condition. If your provider identifies any concerns that need to be evaluated in person or the need to arrange testing (such as labs, EKG, etc.), we will make arrangements to do so. Although advances in technology are sophisticated, we cannot ensure that it will always work on either your end or our end. If the connection with a video visit is poor, the visit may have to be switched to a telephone visit. With either a video or telephone visit, we are not always able to ensure that we have a secure connection.  By engaging in this virtual visit, you consent to the provision of healthcare and authorize for your insurance to be billed (if applicable) for the services provided during this visit. Depending on your insurance coverage, you may receive a charge related to this service.  I need to obtain your verbal consent now. Are you willing to proceed with your visit today? Angela Woodard has provided verbal consent on 02/29/2024 for a virtual visit (video or telephone). Angela CHRISTELLA Dickinson, PA-C  Date: 02/29/2024 7:09 PM   Virtual Visit via Video Note   I, Angela Woodard, connected with  Angela Woodard  ([id number], 02-09-1991) on 02/29/24 at  6:45 PM EDT by a video-enabled telemedicine application and verified that I am speaking with the correct person using two identifiers.  Location: Patient: Virtual  Visit Location Patient: Home Provider: Virtual Visit Location Provider: Home Office   I discussed the limitations of evaluation and management by telemedicine and the availability of in person appointments. The patient expressed understanding and agreed to proceed.    History of Present Illness: Angela Woodard is a 33 y.o. who identifies as a female who was assigned female at birth, and is being seen today for recurrent sinus infection now with fever and elevated blood pressure following postpartum pre-eclampsia.  HPI: Sinusitis This is a recurrent problem. Episode onset: Completed E-visit questionnaire on 02/20/24 and given Prednisone  20mg , Take 40mg  x 5 days; has had no improvements. The problem has been gradually worsening since onset. The maximum temperature recorded prior to her arrival was 102 - 102.9 F. The pain is moderate. Associated symptoms include congestion, ear pain, headaches and sinus pressure. Pertinent negatives include no chills, coughing, sneezing or sore throat. Past treatments include oral decongestants and acetaminophen . The treatment provided no relief.   Tested negative on at home Covid+ Flu Test today.  Was hospitalized twice after induction of labor, once was for an infection of the uterus where she was placed on 3 different antibiotics for this. The second was for postpartum pre-eclampsia.  Does have an OB/GYN follow up on Friday 03/04/24. Today BP: 135/92, normal is 100/50; did have a BP today 150/108. Addended to add: Patient did mention that she is NOT having any headache like what had been associated with Pre-eclampsia. Those symptoms are all resolved.  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   Difficulty swallowing 01/20/2023   Pes  planus of both feet 01/20/2023   History of seizure disorder 11/20/2022   Thyroid  enlarged 08/18/2022   Ehlers-Danlos syndrome 12/09/2021   Celiac disease 05/02/2021   Fibromyalgia 05/02/2021   Cardiac disease during pregnancy in second trimester 05/01/2021   Postural orthostatic tachycardia syndrome 09/03/2020   IgA nephropathy 09/03/2020   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   Nephrolithiasis 12/18/2019   Seizure disorder (HCC) 12/18/2019   Depression affecting pregnancy in second trimester, antepartum 09/21/2017   Alteration consciousness 12/04/2015    Allergies:  Allergies  Allergen Reactions   Relpax [Eletriptan] Anaphylaxis    Throat swelling   Sulfa Antibiotics Hives and Rash   Gluten Meal    Latex Hives   Lamotrigine Rash   Medications:  Current Outpatient Medications:    amoxicillin  (AMOXIL ) 875 MG tablet, Take 1 tablet (875 mg total) by mouth 2 (two) times daily for 10 days., Disp: 20 tablet, Rfl: 0   enalapril  (VASOTEC ) 5 MG tablet, Take 1 tablet (5 mg total) by mouth daily., Disp: 30 tablet, Rfl: 0   acetaminophen  (TYLENOL ) 500 MG tablet, Take 1,000 mg by mouth every 6 (six) hours as needed., Disp: , Rfl:    albuterol  (VENTOLIN  HFA) 108 (90 Base) MCG/ACT inhaler, Inhale 2 puffs into the lungs every 4 (four) hours as needed for wheezing or shortness of breath., Disp: 18 g, Rfl: 1   amphetamine -dextroamphetamine  (ADDERALL) 20 MG tablet, Take 20 mg by mouth 2 (two) times daily. 20mg  in the morning and 10mg  at night, Disp: , Rfl:    butalbital -acetaminophen -caffeine  (FIORICET) 50-325-40 MG tablet, Take 1-2 tablets by mouth every 6 (six) hours as needed for headache., Disp: 20 tablet, Rfl: 0   cetirizine  (ZYRTEC ) 10 MG tablet, Take 1 tablet (10 mg total) by mouth in the morning, at noon, and at bedtime., Disp: 90 tablet, Rfl: 5   cromolyn  (GASTROCROM ) 100 MG/5ML solution, Take 5 mLs (100 mg total) by mouth 3 (three) times daily before meals., Disp: 480 mL, Rfl: 5    cyclobenzaprine  (FLEXERIL ) 5 MG tablet, Take 1 tablet (5 mg total) by mouth at bedtime as needed for muscle spasms., Disp: 30 tablet, Rfl: 0   EPINEPHrine  0.3 mg/0.3 mL IJ SOAJ injection, Inject 0.3 mg into the muscle as needed for anaphylaxis. (Patient not taking: Reported on 11/13/2023), Disp: 1 each, Rfl: 2   famotidine  (PEPCID ) 20 MG tablet, Take 1 tablet (20 mg total) by mouth 3 (three) times daily., Disp: 90 tablet, Rfl: 5   fluticasone  (FLONASE ) 50 MCG/ACT nasal spray, Place 2 sprays into both nostrils daily., Disp: 16 g, Rfl: 5   gabapentin  (NEURONTIN ) 400 MG capsule, TAKE 1 CAPSULE(400 MG) BY MOUTH THREE TIMES DAILY, Disp: 270 capsule, Rfl: 0   ibuprofen (ADVIL) 200 MG tablet, Take 400 mg by mouth every 6 (six) hours as needed. 200mg  x2, Disp: , Rfl:    montelukast  (SINGULAIR ) 10 MG tablet, Take 1 tablet (10 mg total) by mouth at bedtime., Disp: 90 tablet, Rfl: 1   Prenatal MV-Min-Fe Cbn-FA-DHA (OBTREX DHA) 29-1 & 350 MG MISC, , Disp: , Rfl:    propranolol  (INDERAL ) 10 MG tablet, Take 20 mg by mouth 3 (three) times daily., Disp: , Rfl:  Observations/Objective: Patient is well-developed, well-nourished in no acute distress.  Resting comfortably at home.  Head is normocephalic, atraumatic.  No labored breathing.  Speech is clear and coherent with logical content.  Patient is alert and oriented at baseline.    Assessment and Plan: 1. Acute bacterial sinusitis (Primary) - amoxicillin  (AMOXIL ) 875 MG tablet; Take 1 tablet (875 mg total) by mouth 2 (two) times daily for 10 days.  Dispense: 20 tablet; Refill: 0  2. Postpartum hypertension - enalapril  (VASOTEC ) 5 MG tablet; Take 1 tablet (5 mg total) by mouth daily.  Dispense: 30 tablet; Refill: 0  3. Preeclampsia in postpartum period - enalapril  (VASOTEC ) 5 MG tablet; Take 1 tablet (5 mg total) by mouth daily.  Dispense: 30 tablet; Refill: 0  - Patient with recurrent sinusitis and new onset fever today - Will treat with Amoxicillin   875mg  BID x 10 days  - BP also remains high even though on Propranolol  20mg  TID - Add Enalapril  5mg  daily, advised safe in breastfeeding through Infant Risk HCP resource - Continue Propranolol  - Keep scheduled follow up with OB/GYN on Friday 03/04/24 - Seek in person evaluation if symptoms worsen prior to that appointment time  Follow Up Instructions: I discussed the assessment and treatment plan with the patient. The patient was provided an opportunity to ask questions and all were answered. The patient agreed with the plan and demonstrated an understanding of the instructions.  A copy of instructions were sent to the patient via MyChart unless otherwise noted below.    The patient was advised to call back or seek an in-person evaluation if the symptoms worsen or if the condition fails to improve as anticipated.    Angela CHRISTELLA Dickinson, PA-C

## 2024-02-29 NOTE — Addendum Note (Signed)
 Addended by: VIVIENNE DELON HERO on: 02/29/2024 07:09 PM   Modules accepted: Level of Service

## 2024-03-29 ENCOUNTER — Other Ambulatory Visit: Payer: Self-pay | Admitting: Neurology

## 2024-04-07 ENCOUNTER — Encounter: Payer: Self-pay | Admitting: Neurology

## 2024-04-08 ENCOUNTER — Telehealth: Admitting: Physician Assistant

## 2024-04-08 DIAGNOSIS — B9689 Other specified bacterial agents as the cause of diseases classified elsewhere: Secondary | ICD-10-CM | POA: Diagnosis not present

## 2024-04-08 DIAGNOSIS — J019 Acute sinusitis, unspecified: Secondary | ICD-10-CM | POA: Diagnosis not present

## 2024-04-08 MED ORDER — AZITHROMYCIN 250 MG PO TABS: ORAL_TABLET | ORAL | 0 refills | Status: AC

## 2024-04-08 MED ORDER — GABAPENTIN 400 MG PO CAPS: 400.0000 mg | ORAL_CAPSULE | Freq: Three times a day (TID) | ORAL | 2 refills | Status: AC

## 2024-04-08 NOTE — Progress Notes (Signed)
 E-Visit for Sinus Problems  We are sorry that you are not feeling well.  Here is how we plan to help!  Based on what you have shared with me it looks like you have sinusitis.  Sinusitis is inflammation and infection in the sinus cavities of the head.  Based on your presentation I believe you most likely have Acute Bacterial Sinusitis.  This is an infection caused by bacteria and is treated with antibiotics. I have prescribed Azithromycin  250mg  Take 2 tablets on day 1, then 1 tablet daily for 4 days. You may use an oral decongestant such as Mucinex D or if you have glaucoma or high blood pressure use plain Mucinex. Saline nasal spray help and can safely be used as often as needed for congestion.  If you develop worsening sinus pain, fever or notice severe headache and vision changes, or if symptoms are not better after completion of antibiotic, please schedule an appointment with a health care provider.    Sinus infections are not as easily transmitted as other respiratory infection, however we still recommend that you avoid close contact with loved ones, especially the very young and elderly.  Remember to wash your hands thoroughly throughout the day as this is the number one way to prevent the spread of infection!  Home Care: Only take medications as instructed by your medical team. Complete the entire course of an antibiotic. Do not take these medications with alcohol. A steam or ultrasonic humidifier can help congestion.  You can place a towel over your head and breathe in the steam from hot water coming from a faucet. Avoid close contacts especially the very young and the elderly. Cover your mouth when you cough or sneeze. Always remember to wash your hands.  Get Help Right Away If: You develop worsening fever or sinus pain. You develop a severe head ache or visual changes. Your symptoms persist after you have completed your treatment plan.  Make sure you Understand these  instructions. Will watch your condition. Will get help right away if you are not doing well or get worse.  Your e-visit answers were reviewed by a board certified advanced clinical practitioner to complete your personal care plan.  Depending on the condition, your plan could have included both over the counter or prescription medications.  If there is a problem please reply  once you have received a response from your provider.  Your safety is important to us .  If you have drug allergies check your prescription carefully.    You can use MyChart to ask questions about today's visit, request a non-urgent call back, or ask for a work or school excuse for 24 hours related to this e-Visit. If it has been greater than 24 hours you will need to follow up with your provider, or enter a new e-Visit to address those concerns.  You will get an e-mail in the next two days asking about your experience.  I hope that your e-visit has been valuable and will speed your recovery. Thank you for using e-visits.  I have spent 5 minutes in review of e-visit questionnaire, review and updating patient chart, medical decision making and response to patient.   [name], PA-C

## 2024-04-14 ENCOUNTER — Ambulatory Visit: Admitting: Allergy

## 2024-04-21 ENCOUNTER — Ambulatory Visit: Admitting: Allergy

## 2024-04-29 ENCOUNTER — Telehealth: Admitting: Emergency Medicine

## 2024-04-29 DIAGNOSIS — J3489 Other specified disorders of nose and nasal sinuses: Secondary | ICD-10-CM

## 2024-04-29 NOTE — Progress Notes (Signed)
  Because you were recently treated for a sinus infection 3 weeks ago, I feel your condition warrants further evaluation and I recommend that you be seen in a face-to-face visit.  I am very limited in what I can treat by EVisit.    NOTE: There will be NO CHARGE for this E-Visit   If you are having a true medical emergency, please call 911.     For an urgent face to face visit, [facility] has multiple urgent care centers for your convenience.  Click the link below for the full list of locations and hours, walk-in wait times, appointment scheduling options and driving directions:  Urgent Care - [place], [place], [place], [place], [place], [place]  [place]     Your MyChart E-visit questionnaire answers were reviewed by a board certified advanced clinical practitioner to complete your personal care plan based on your specific symptoms.    Thank you for using e-Visits.

## 2024-05-09 ENCOUNTER — Encounter: Payer: Self-pay | Admitting: Radiology

## 2024-05-20 ENCOUNTER — Ambulatory Visit: Admitting: Allergy

## 2024-06-04 ENCOUNTER — Other Ambulatory Visit: Payer: Self-pay | Admitting: Allergy

## 2024-06-24 ENCOUNTER — Emergency Department (HOSPITAL_COMMUNITY)

## 2024-06-24 ENCOUNTER — Emergency Department (HOSPITAL_COMMUNITY)
Admission: EM | Admit: 2024-06-24 | Discharge: 2024-06-24 | Discharge status: Against Medical Advice | Attending: Emergency Medicine | Admitting: Emergency Medicine

## 2024-06-24 ENCOUNTER — Encounter (HOSPITAL_COMMUNITY): Payer: Self-pay | Admitting: *Deleted

## 2024-06-24 ENCOUNTER — Other Ambulatory Visit: Payer: Self-pay

## 2024-06-24 DIAGNOSIS — R109 Unspecified abdominal pain: Secondary | ICD-10-CM | POA: Insufficient documentation

## 2024-06-24 DIAGNOSIS — R079 Chest pain, unspecified: Secondary | ICD-10-CM | POA: Insufficient documentation

## 2024-06-24 DIAGNOSIS — Z5321 Procedure and treatment not carried out due to patient leaving prior to being seen by health care provider: Secondary | ICD-10-CM | POA: Insufficient documentation

## 2024-06-24 DIAGNOSIS — R140 Abdominal distension (gaseous): Secondary | ICD-10-CM | POA: Insufficient documentation

## 2024-06-24 DIAGNOSIS — R112 Nausea with vomiting, unspecified: Secondary | ICD-10-CM | POA: Insufficient documentation

## 2024-06-24 LAB — URINALYSIS, ROUTINE W REFLEX MICROSCOPIC
Bilirubin Urine: NEGATIVE
Glucose, UA: NEGATIVE mg/dL
Ketones, ur: NEGATIVE mg/dL
Leukocytes,Ua: NEGATIVE
Nitrite: NEGATIVE
Protein, ur: NEGATIVE mg/dL
Specific Gravity, Urine: 1.015 (ref 1.005–1.030)
pH: 6 (ref 5.0–8.0)

## 2024-06-24 LAB — CBC WITH DIFFERENTIAL/PLATELET
Abs Immature Granulocytes: 0.03 K/uL (ref 0.00–0.07)
Basophils Absolute: 0 K/uL (ref 0.0–0.1)
Basophils Relative: 0 %
Eosinophils Absolute: 0 K/uL (ref 0.0–0.5)
Eosinophils Relative: 0 %
HCT: 42.8 % (ref 36.0–46.0)
Hemoglobin: 14.3 g/dL (ref 12.0–15.0)
Immature Granulocytes: 1 %
Lymphocytes Relative: 13 %
Lymphs Abs: 0.8 K/uL (ref 0.7–4.0)
MCH: 31.6 pg (ref 26.0–34.0)
MCHC: 33.4 g/dL (ref 30.0–36.0)
MCV: 94.7 fL (ref 80.0–100.0)
Monocytes Absolute: 0.5 K/uL (ref 0.1–1.0)
Monocytes Relative: 8 %
Neutro Abs: 5.1 K/uL (ref 1.7–7.7)
Neutrophils Relative %: 78 %
Platelets: 237 K/uL (ref 150–400)
RBC: 4.52 MIL/uL (ref 3.87–5.11)
RDW: 11.3 % — ABNORMAL LOW (ref 11.5–15.5)
WBC: 6.5 K/uL (ref 4.0–10.5)
nRBC: 0 % (ref 0.0–0.2)

## 2024-06-24 LAB — COMPREHENSIVE METABOLIC PANEL WITH GFR
ALT: 14 U/L (ref 0–44)
AST: 22 U/L (ref 15–41)
Albumin: 4.6 g/dL (ref 3.5–5.0)
Alkaline Phosphatase: 84 U/L (ref 38–126)
Anion gap: 13 (ref 5–15)
BUN: 12 mg/dL (ref 6–20)
CO2: 23 mmol/L (ref 22–32)
Calcium: 10.2 mg/dL (ref 8.9–10.3)
Chloride: 102 mmol/L (ref 98–111)
Creatinine, Ser: 0.75 mg/dL (ref 0.44–1.00)
GFR, Estimated: >60 mL/min
Glucose, Bld: 102 mg/dL — ABNORMAL HIGH (ref 70–99)
Potassium: 4.1 mmol/L (ref 3.5–5.1)
Sodium: 137 mmol/L (ref 135–145)
Total Bilirubin: 0.3 mg/dL (ref 0.0–1.2)
Total Protein: 7.8 g/dL (ref 6.5–8.1)

## 2024-06-24 LAB — HCG, SERUM, QUALITATIVE: Preg, Serum: NEGATIVE

## 2024-06-24 LAB — URINALYSIS, MICROSCOPIC (REFLEX): Bacteria, UA: NONE SEEN

## 2024-06-24 LAB — TROPONIN T, HIGH SENSITIVITY: Troponin T High Sensitivity: <15 ng/L (ref 0–19)

## 2024-06-24 LAB — LIPASE, BLOOD: Lipase: 28 U/L (ref 11–51)

## 2024-06-24 MED ORDER — IOHEXOL 350 MG/ML SOLN
75.0000 mL | Freq: Once | INTRAVENOUS | Status: AC | PRN
  Administered 2024-06-24: 75 mL via INTRAVENOUS

## 2024-06-24 MED ORDER — ONDANSETRON 4 MG PO TBDP
4.0000 mg | ORAL_TABLET | Freq: Once | ORAL | Status: AC
  Administered 2024-06-24: 4 mg via ORAL
  Filled 2024-06-24: qty 1

## 2024-06-24 MED ORDER — IBUPROFEN 800 MG PO TABS
800.0000 mg | ORAL_TABLET | Freq: Once | ORAL | Status: AC
  Administered 2024-06-24: 800 mg via ORAL
  Filled 2024-06-24: qty 1

## 2024-06-24 NOTE — ED Triage Notes (Signed)
 The pt is breast feeding and she still has  her gallbladder

## 2024-06-24 NOTE — ED Provider Triage Note (Signed)
 Emergency Medicine Provider Triage Evaluation Note  GISELE PACK , a 33 y.o. female  was evaluated in triage.  Pt complains of abdominal pain x 2 days.  Patient states that yesterday she started experiencing abdominal pain with associated chest pain that was wax/wane until this afternoon and has stayed constant.  Patient states that she is having associated nausea, vomiting, abdominal bloating, and some constipation.  Patient states she does have a hiatal hernia and is concerned about possible strangulation.  Patient states that she is 4 months postpartum and currently breast-feeding-she had postpartum preeclampsia.  Patient is currently in no acute distress.  Review of Systems  Positive: Abdominal pain Negative: Fevers  Physical Exam  BP 116/68   Pulse (!) 109   Temp (!) 97.5 F (36.4 C)   Resp 18   Ht 5' 4 (1.626 m)   Wt 79.2 kg   SpO2 96%   Breastfeeding Yes   BMI 29.97 kg/m  Gen:   Awake, no distress   Resp:  Normal effort  MSK:   Moves extremities without difficulty  Other:  Abdomen TTP. Medical Decision Making  Medically screening exam initiated at 4:44 PM.  Appropriate orders placed.  TONESHIA COELLO was informed that the remainder of the evaluation will be completed by another provider, this initial triage assessment does not replace that evaluation, and the importance of remaining in the ED until their evaluation is complete.    Willma Duwaine [place], [place] 06/24/24 [phone]

## 2024-06-24 NOTE — ED Triage Notes (Signed)
 Patient states abdominal pain and chest pain. Chest pain started yesterday and went away and came back today worse she states that it feels like a elephant sitting on her chest. The abdominal pain has progressively gotten worse. She has a hital hernia that she's concerned about. She has been having nausea, vomiting, bloating, unable to pass gas and issues having a BM. Last BM was a few hours ago and was very small.

## 2024-06-24 NOTE — ED Notes (Addendum)
Patient notified RN that she is leaving .

## 2024-06-24 NOTE — ED Notes (Signed)
 The pt reports that she has cannot take ibu for her  gastric disease

## 2024-08-08 ENCOUNTER — Telehealth: Admitting: Emergency Medicine

## 2024-08-08 DIAGNOSIS — J3489 Other specified disorders of nose and nasal sinuses: Secondary | ICD-10-CM

## 2024-08-08 NOTE — Progress Notes (Signed)
 Because this is at least your 7th sinus infection in less than a year that I can see, I am not able to treat you by visit. Your condition warrants further evaluation and I recommend that you be seen for a face to face visit.  Please contact your primary care physician practice to be seen. Many offices offer virtual options to be seen via video if you are not comfortable going in person to a medical facility at this time.  NOTE: You will NOT be charged for this eVisit.  If you do not have a PCP, [facility] offers a free physician referral service available at [phone]. Our trained staff has the experience, knowledge and resources to put you in touch with a physician who is right for you.    If you are having a true medical emergency please call 911.   Your e-visit answers were reviewed by a board certified advanced clinical practitioner to complete your personal care plan.  Thank you for using e-Visits.

## 2024-08-11 ENCOUNTER — Other Ambulatory Visit: Payer: Self-pay

## 2024-08-11 ENCOUNTER — Encounter (HOSPITAL_BASED_OUTPATIENT_CLINIC_OR_DEPARTMENT_OTHER): Payer: Self-pay

## 2024-08-11 ENCOUNTER — Emergency Department (HOSPITAL_BASED_OUTPATIENT_CLINIC_OR_DEPARTMENT_OTHER)
Admission: EM | Admit: 2024-08-11 | Discharge: 2024-08-11 | Disposition: A | Discharge status: Home / Self Care | Source: Home / Self Care | Attending: Emergency Medicine | Admitting: Emergency Medicine

## 2024-08-11 ENCOUNTER — Emergency Department (HOSPITAL_BASED_OUTPATIENT_CLINIC_OR_DEPARTMENT_OTHER)

## 2024-08-11 DIAGNOSIS — K51211 Ulcerative (chronic) proctitis with rectal bleeding: Secondary | ICD-10-CM

## 2024-08-11 LAB — COMPREHENSIVE METABOLIC PANEL WITH GFR
ALT: 16 U/L (ref 0–44)
AST: 21 U/L (ref 15–41)
Albumin: 4.6 g/dL (ref 3.5–5.0)
Alkaline Phosphatase: 89 U/L (ref 38–126)
Anion gap: 13 (ref 5–15)
BUN: 14 mg/dL (ref 6–20)
CO2: 25 mmol/L (ref 22–32)
Calcium: 10.2 mg/dL (ref 8.9–10.3)
Chloride: 102 mmol/L (ref 98–111)
Creatinine, Ser: 0.7 mg/dL (ref 0.44–1.00)
GFR, Estimated: >60 mL/min
Glucose, Bld: 105 mg/dL — ABNORMAL HIGH (ref 70–99)
Potassium: 3.9 mmol/L (ref 3.5–5.1)
Sodium: 139 mmol/L (ref 135–145)
Total Bilirubin: 0.3 mg/dL (ref 0.0–1.2)
Total Protein: 8 g/dL (ref 6.5–8.1)

## 2024-08-11 LAB — CBC WITH DIFFERENTIAL/PLATELET
Abs Immature Granulocytes: 0.02 10*3/uL (ref 0.00–0.07)
Basophils Absolute: 0 10*3/uL (ref 0.0–0.1)
Basophils Relative: 0 %
Eosinophils Absolute: 0.1 10*3/uL (ref 0.0–0.5)
Eosinophils Relative: 1 %
HCT: 39.3 % (ref 36.0–46.0)
Hemoglobin: 13.6 g/dL (ref 12.0–15.0)
Immature Granulocytes: 0 %
Lymphocytes Relative: 42 %
Lymphs Abs: 3.8 10*3/uL (ref 0.7–4.0)
MCH: 31.7 pg (ref 26.0–34.0)
MCHC: 34.6 g/dL (ref 30.0–36.0)
MCV: 91.6 fL (ref 80.0–100.0)
Monocytes Absolute: 0.6 10*3/uL (ref 0.1–1.0)
Monocytes Relative: 7 %
Neutro Abs: 4.5 10*3/uL (ref 1.7–7.7)
Neutrophils Relative %: 50 %
Platelets: 256 10*3/uL (ref 150–400)
RBC: 4.29 MIL/uL (ref 3.87–5.11)
RDW: 11.7 % (ref 11.5–15.5)
WBC: 9.1 10*3/uL (ref 4.0–10.5)
nRBC: 0 % (ref 0.0–0.2)

## 2024-08-11 LAB — LIPASE, BLOOD: Lipase: 32 U/L (ref 11–51)

## 2024-08-11 LAB — URINALYSIS, ROUTINE W REFLEX MICROSCOPIC
Bacteria, UA: NONE SEEN
Bilirubin Urine: NEGATIVE
Glucose, UA: NEGATIVE mg/dL
Ketones, ur: NEGATIVE mg/dL
Leukocytes,Ua: NEGATIVE
Nitrite: NEGATIVE
Protein, ur: NEGATIVE mg/dL
Specific Gravity, Urine: 1.01 (ref 1.005–1.030)
pH: 7 (ref 5.0–8.0)

## 2024-08-11 LAB — PREGNANCY, URINE: Preg Test, Ur: NEGATIVE

## 2024-08-11 MED ORDER — MORPHINE SULFATE (PF) 4 MG/ML IV SOLN
4.0000 mg | Freq: Once | INTRAVENOUS | Status: AC
  Administered 2024-08-11: 4 mg via INTRAVENOUS
  Filled 2024-08-11: qty 1

## 2024-08-11 MED ORDER — MESALAMINE 1000 MG RE SUPP: 1000.0000 mg | Freq: Every day | RECTAL | 0 refills | Status: AC

## 2024-08-11 MED ORDER — ONDANSETRON HCL 4 MG/2ML IJ SOLN
4.0000 mg | Freq: Once | INTRAMUSCULAR | Status: AC
  Administered 2024-08-11: 4 mg via INTRAVENOUS
  Filled 2024-08-11: qty 2

## 2024-08-11 MED ORDER — IOHEXOL 300 MG/ML  SOLN
100.0000 mL | Freq: Once | INTRAMUSCULAR | Status: AC | PRN
  Administered 2024-08-11: 100 mL via INTRAVENOUS

## 2024-08-11 NOTE — ED Provider Notes (Signed)
 " [place] EMERGENCY DEPARTMENT AT [facility] Provider Note   CSN: [id number] Arrival date & time: 08/11/24  [id number]     Patient presents with: Rectal Bleeding   Angela Woodard is a 34 y.o. female.   34 year old female with past medical history significant for ulcerative colitis who is followed by [facility] gastroenterology who presents today for 2-day duration of bloody stools.  This started yesterday morning.  Has had about 6-7 episodes since onset.  Has generalized abdominal discomfort.  She states she had 1 flareup in the past which is how she got diagnosed with ulcerative colitis about 1 year ago.  She has noticed penny sized clots in her stools as well.  No melanotic stools.  No hematemesis.  The history is provided by the patient. No language interpreter was used.       Prior to Admission medications  Medication Sig Start Date End Date Taking? Authorizing Provider  acetaminophen  (TYLENOL ) 500 MG tablet Take 1,000 mg by mouth every 6 (six) hours as needed.    [provider]  albuterol  (VENTOLIN  HFA) 108 (90 Base) MCG/ACT inhaler Inhale 2 puffs into the lungs every 4 (four) hours as needed for wheezing or shortness of breath. 11/13/23   Jeneal Danita Macintosh, MD  amphetamine -dextroamphetamine  (ADDERALL) 20 MG tablet Take 20 mg by mouth 2 (two) times daily. 20mg  in the morning and 10mg  at night 12/03/22 02/05/24  [provider]  butalbital -acetaminophen -caffeine  (FIORICET) 50-325-40 MG tablet Take 1-2 tablets by mouth every 6 (six) hours as needed for headache. 02/04/24 02/03/25  Cresenzo, John V, MD  cetirizine  (ZYRTEC ) 10 MG tablet Take 1 tablet (10 mg total) by mouth in the morning, at noon, and at bedtime. 11/13/23   Jeneal Danita Macintosh, MD  cromolyn  (GASTROCROM ) 100 MG/5ML solution Take 5 mLs (100 mg total) by mouth 3 (three) times daily before meals. 11/13/23   Jeneal Danita Macintosh, MD  cyclobenzaprine  (FLEXERIL ) 5 MG tablet Take 1 tablet (5 mg total)  by mouth at bedtime as needed for muscle spasms. 11/10/23   Ozan, Jennifer, DO  enalapril  (VASOTEC ) 5 MG tablet Take 1 tablet (5 mg total) by mouth daily. 02/29/24   Vivienne Delon HERO, PA-C  EPINEPHrine  0.3 mg/0.3 mL IJ SOAJ injection Inject 0.3 mg into the muscle as needed for anaphylaxis. Patient not taking: Reported on 11/13/2023 01/15/23   Jeneal Danita Macintosh, MD  famotidine  (PEPCID ) 20 MG tablet Take 1 tablet (20 mg total) by mouth 3 (three) times daily. 11/13/23   Jeneal Danita Macintosh, MD  fluticasone  (FLONASE ) 50 MCG/ACT nasal spray Place 2 sprays into both nostrils daily. 11/13/23   Jeneal Danita Macintosh, MD  gabapentin  (NEURONTIN ) 400 MG capsule Take 1 capsule (400 mg total) by mouth 3 (three) times daily. 04/08/24   Georjean Darice HERO, MD  ibuprofen  (ADVIL ) 200 MG tablet Take 400 mg by mouth every 6 (six) hours as needed. 200mg  x2    [provider]  montelukast  (SINGULAIR ) 10 MG tablet Take 1 tablet (10 mg total) by mouth at bedtime. 11/13/23   Jeneal Danita Macintosh, MD  Prenatal MV-Min-Fe Cbn-FA-DHA (OBTREX DHA) 29-1 & 350 MG MISC     [provider]  propranolol  (INDERAL ) 10 MG tablet Take 20 mg by mouth 3 (three) times daily.    [provider]    Allergies: Relpax [eletriptan], Sulfa antibiotics, Gluten meal, Latex, and Lamotrigine    Review of Systems  Constitutional:  Negative for fever.  Gastrointestinal:  Positive for abdominal pain and  blood in stool. Negative for nausea and vomiting.  Genitourinary:  Negative for dysuria.  All other systems reviewed and are negative.   Updated Vital Signs BP 110/64   Pulse 80   Temp 97.8 F (36.6 C)   Resp 16   SpO2 98%   Breastfeeding Yes   Physical Exam Vitals and nursing note reviewed.  Constitutional:      General: She is not in acute distress.    Appearance: Normal appearance. She is not ill-appearing.  HENT:     Head: Normocephalic and atraumatic.     Nose: Nose normal.  Eyes:      Conjunctiva/sclera: Conjunctivae normal.  Cardiovascular:     Rate and Rhythm: Normal rate and regular rhythm.  Pulmonary:     Effort: Pulmonary effort is normal. No respiratory distress.  Abdominal:     General: There is no distension.     Palpations: Abdomen is soft.     Tenderness: There is abdominal tenderness. There is no right CVA tenderness, left CVA tenderness, guarding or rebound.  Musculoskeletal:        General: No deformity. Normal range of motion.     Cervical back: Normal range of motion.  Skin:    Findings: No rash.  Neurological:     Mental Status: She is alert.     (all labs ordered are listed, but only abnormal results are displayed) Labs Reviewed  COMPREHENSIVE METABOLIC PANEL WITH GFR - Abnormal; Notable for the following components:      Result Value   Glucose, Bld 105 (*)    All other components within normal limits  URINALYSIS, ROUTINE W REFLEX MICROSCOPIC - Abnormal; Notable for the following components:   Color, Urine COLORLESS (*)    Hgb urine dipstick SMALL (*)    All other components within normal limits  LIPASE, BLOOD  CBC WITH DIFFERENTIAL/PLATELET  PREGNANCY, URINE    EKG: None  Radiology: No results found.   Procedures   Medications Ordered in the ED  ondansetron  (ZOFRAN ) injection 4 mg (4 mg Intravenous Given 08/11/24 1659)  morphine  (PF) 4 MG/ML injection 4 mg (4 mg Intravenous Given 08/11/24 1658)    Clinical Course as of 08/11/24 1821  Thu Aug 11, 2024  1802 CT with evidence of proctitis.  Discussed with on-call gastroenterologist given her history of ulcerative colitis and see if they want to start steroids. [AA]    Clinical Course User Index [AA] Hildegard Loge, PA-C                                 Medical Decision Making Amount and/or Complexity of Data Reviewed Labs: ordered. Radiology: ordered.  Risk Prescription drug management.   Medical Decision Making / ED Course   This patient presents to the ED for concern of  rectal bleeding, this involves an extensive number of treatment options, and is a complaint that carries with it a high risk of complications and morbidity.  The differential diagnosis includes flareup of ulcerative colitis, hemorrhoid, GI bleed  MDM: 34 year old female presents today for concern of abdominal pain and rectal bleeding.  Symptoms started yesterday morning.  Has had about 6 or 7 episodes since onset.  Hemodynamically she is stable.  Will obtain labs, CT imaging, provide fluids and pain control and reevaluate.  Spoke with gastroenterologist Dr. Burnette.  He recommends mesalamine  suppositories and follow-up with her primary gastroenterologist.  Patient is in agreement with this plan.  Hemodynamically she is stable.  Hemoglobin is stable. Strict return precautions given.  Patient discharged in stable condition.  Return precautions discussed.   Additional history obtained: -Additional history obtained from chart review with patient's primary gastroenterologist -External records from outside source obtained and reviewed including: Chart review including previous notes, labs, imaging, consultation notes   Lab Tests: -I ordered, reviewed, and interpreted labs.   The pertinent results include:   Labs Reviewed  COMPREHENSIVE METABOLIC PANEL WITH GFR - Abnormal; Notable for the following components:      Result Value   Glucose, Bld 105 (*)    All other components within normal limits  URINALYSIS, ROUTINE W REFLEX MICROSCOPIC - Abnormal; Notable for the following components:   Color, Urine COLORLESS (*)    Hgb urine dipstick SMALL (*)    All other components within normal limits  LIPASE, BLOOD  CBC WITH DIFFERENTIAL/PLATELET  PREGNANCY, URINE      EKG  EKG Interpretation Date/Time:    Ventricular Rate:    PR Interval:    QRS Duration:    QT Interval:    QTC Calculation:   R Axis:      Text Interpretation:           Imaging Studies ordered: I ordered imaging  studies including CT abdomen pelvis with contrast I independently visualized and interpreted imaging. I agree with the radiologist interpretation   Medicines ordered and prescription drug management: Meds ordered this encounter  Medications   ondansetron  (ZOFRAN ) injection 4 mg   morphine  (PF) 4 MG/ML injection 4 mg    -I have reviewed the patients home medicines and have made adjustments as needed  Social Determinants of Health:  Factors impacting patients care include: Good outpatient follow-up   Reevaluation: After the interventions noted above, I reevaluated the patient and found that they have :improved  Co morbidities that complicate the patient evaluation  Past Medical History:  Diagnosis Date   Anemia    Arthritis    Asthma    Celiac disease    Depression    Ehlers-Danlos, hypermobile type    Normal echo 2021   Elbow fracture, left    age 89   Fibromyalgia    History of kidney stones    Mast cell activation syndrome    Migraine    Nephropathy    OCD (obsessive compulsive disorder)    Ovarian cyst    POTS (postural orthostatic tachycardia syndrome)    Pruritus    Normal bile acids 06/06/2021   Seizure disorder (HCC)    Last reported event 05/15/2021- during pt sleep; epilepsy   Urticaria    Vaginal Pap smear, abnormal       Dispostion: Discharged in stable condition.  Return precaution discussed.  Patient voices understanding and is in agreement with plan.  Final diagnoses:  Ulcerative proctitis with rectal bleeding [facility])    ED Discharge Orders          Ordered    mesalamine  (CANASA ) 1000 MG suppository  Daily at bedtime        08/11/24 1818               Hildegard Loge, PA-C 08/11/24 Angela Doretha Folks, MD 08/11/24 1911  "

## 2024-08-11 NOTE — ED Triage Notes (Signed)
 Patient with hx of ulcerative colitis has increased bloody stool for past 3 days and abdominal pain. She reports feeling she is in a flare up.

## 2024-08-11 NOTE — Discharge Instructions (Addendum)
 I spoke to gastroenterologist regarding the findings.  They recommended mesalamine  suppositories.  Have sent this into the pharmacy for you.  Please call and follow-up with your primary gastroenterologist.  Return for any concerning symptoms such as worsening bleeding, fever, if you become lightheaded.

## 2024-08-11 NOTE — ED Notes (Signed)
 Reviewed AVS/discharge instructions with patient. Time allotted for and all questions answered. Patient is agreeable for d/c and escorted to ED exit by staff.
